# Patient Record
Sex: Female | Born: 1945
Health system: Southern US, Community
[De-identification: ages and names within clinical notes are randomized; demographics above are authoritative.]

## PROBLEM LIST (undated history)

## (undated) DIAGNOSIS — M792 Neuralgia and neuritis, unspecified: Secondary | ICD-10-CM

## (undated) DIAGNOSIS — M4150 Other secondary scoliosis, site unspecified: Secondary | ICD-10-CM

## (undated) DIAGNOSIS — J189 Pneumonia, unspecified organism: Secondary | ICD-10-CM

## (undated) DIAGNOSIS — M5030 Other cervical disc degeneration, unspecified cervical region: Secondary | ICD-10-CM

## (undated) DIAGNOSIS — Z9889 Other specified postprocedural states: Secondary | ICD-10-CM

## (undated) DIAGNOSIS — K219 Gastro-esophageal reflux disease without esophagitis: Secondary | ICD-10-CM

## (undated) DIAGNOSIS — I4510 Unspecified right bundle-branch block: Secondary | ICD-10-CM

## (undated) DIAGNOSIS — M5136 Other intervertebral disc degeneration, lumbar region: Secondary | ICD-10-CM

## (undated) DIAGNOSIS — M4180 Other forms of scoliosis, site unspecified: Secondary | ICD-10-CM

## (undated) DIAGNOSIS — M51369 Other intervertebral disc degeneration, lumbar region without mention of lumbar back pain or lower extremity pain: Secondary | ICD-10-CM

## (undated) DIAGNOSIS — Z950 Presence of cardiac pacemaker: Secondary | ICD-10-CM

## (undated) DIAGNOSIS — F32A Depression, unspecified: Secondary | ICD-10-CM

## (undated) DIAGNOSIS — F419 Anxiety disorder, unspecified: Secondary | ICD-10-CM

## (undated) DIAGNOSIS — R112 Nausea with vomiting, unspecified: Secondary | ICD-10-CM

## (undated) DIAGNOSIS — M1990 Unspecified osteoarthritis, unspecified site: Secondary | ICD-10-CM

## (undated) DIAGNOSIS — I10 Essential (primary) hypertension: Secondary | ICD-10-CM

## (undated) HISTORY — PX: DILATION AND CURETTAGE OF UTERUS: SHX78

## (undated) HISTORY — PX: CATARACT EXTRACTION W/ INTRAOCULAR LENS  IMPLANT, BILATERAL: SHX1307

## (undated) HISTORY — PX: BACK SURGERY: SHX140

## (undated) HISTORY — PX: OTHER SURGICAL HISTORY: SHX169

## (undated) HISTORY — PX: ANTERIOR CERVICAL DECOMP/DISCECTOMY FUSION: SHX1161

## (undated) HISTORY — PX: TONSILLECTOMY: SUR1361

---

## 1997-11-12 ENCOUNTER — Ambulatory Visit (HOSPITAL_COMMUNITY): Admission: RE | Admit: 1997-11-12 | Discharge: 1997-11-12 | Payer: Self-pay | Admitting: Obstetrics and Gynecology

## 1998-12-02 ENCOUNTER — Ambulatory Visit (HOSPITAL_COMMUNITY): Admission: RE | Admit: 1998-12-02 | Discharge: 1998-12-02 | Payer: Self-pay | Admitting: Obstetrics and Gynecology

## 1998-12-02 ENCOUNTER — Encounter (INDEPENDENT_AMBULATORY_CARE_PROVIDER_SITE_OTHER): Payer: Self-pay | Admitting: Specialist

## 1998-12-21 ENCOUNTER — Other Ambulatory Visit: Admission: RE | Admit: 1998-12-21 | Discharge: 1998-12-21 | Payer: Self-pay | Admitting: Obstetrics and Gynecology

## 1999-07-20 ENCOUNTER — Encounter: Admission: RE | Admit: 1999-07-20 | Discharge: 1999-07-20 | Payer: Self-pay | Admitting: Obstetrics and Gynecology

## 1999-07-20 ENCOUNTER — Encounter: Payer: Self-pay | Admitting: Obstetrics and Gynecology

## 2000-01-03 ENCOUNTER — Other Ambulatory Visit: Admission: RE | Admit: 2000-01-03 | Discharge: 2000-01-03 | Payer: Self-pay | Admitting: Obstetrics and Gynecology

## 2000-11-25 ENCOUNTER — Encounter: Payer: Self-pay | Admitting: Obstetrics and Gynecology

## 2000-11-25 ENCOUNTER — Encounter: Admission: RE | Admit: 2000-11-25 | Discharge: 2000-11-25 | Payer: Self-pay | Admitting: Physician Assistant

## 2001-01-03 ENCOUNTER — Other Ambulatory Visit: Admission: RE | Admit: 2001-01-03 | Discharge: 2001-01-03 | Payer: Self-pay | Admitting: Obstetrics and Gynecology

## 2001-03-25 ENCOUNTER — Encounter: Payer: Self-pay | Admitting: Sports Medicine

## 2001-03-25 ENCOUNTER — Encounter: Admission: RE | Admit: 2001-03-25 | Discharge: 2001-03-25 | Payer: Self-pay | Admitting: Family Medicine

## 2001-04-15 ENCOUNTER — Encounter: Payer: Self-pay | Admitting: Family Medicine

## 2001-04-15 ENCOUNTER — Encounter: Admission: RE | Admit: 2001-04-15 | Discharge: 2001-04-15 | Payer: Self-pay | Admitting: Family Medicine

## 2001-05-08 ENCOUNTER — Encounter: Admission: RE | Admit: 2001-05-08 | Discharge: 2001-05-08 | Payer: Self-pay | Admitting: Sports Medicine

## 2001-05-08 ENCOUNTER — Encounter: Payer: Self-pay | Admitting: Sports Medicine

## 2001-08-15 ENCOUNTER — Encounter: Admission: RE | Admit: 2001-08-15 | Discharge: 2001-09-17 | Payer: Self-pay | Admitting: Neurological Surgery

## 2001-10-07 ENCOUNTER — Encounter: Payer: Self-pay | Admitting: Neurological Surgery

## 2001-10-07 ENCOUNTER — Ambulatory Visit (HOSPITAL_COMMUNITY): Admission: RE | Admit: 2001-10-07 | Discharge: 2001-10-08 | Payer: Self-pay | Admitting: Neurological Surgery

## 2002-01-09 ENCOUNTER — Other Ambulatory Visit: Admission: RE | Admit: 2002-01-09 | Discharge: 2002-01-09 | Payer: Self-pay | Admitting: Obstetrics and Gynecology

## 2003-01-26 ENCOUNTER — Other Ambulatory Visit: Admission: RE | Admit: 2003-01-26 | Discharge: 2003-01-26 | Payer: Self-pay | Admitting: Obstetrics and Gynecology

## 2004-03-02 ENCOUNTER — Other Ambulatory Visit: Admission: RE | Admit: 2004-03-02 | Discharge: 2004-03-02 | Payer: Self-pay | Admitting: Obstetrics and Gynecology

## 2005-03-28 ENCOUNTER — Other Ambulatory Visit: Admission: RE | Admit: 2005-03-28 | Discharge: 2005-03-28 | Payer: Self-pay | Admitting: Obstetrics and Gynecology

## 2007-04-11 ENCOUNTER — Encounter: Admission: RE | Admit: 2007-04-11 | Discharge: 2007-04-11 | Payer: Self-pay | Admitting: Obstetrics and Gynecology

## 2008-06-01 ENCOUNTER — Encounter: Admission: RE | Admit: 2008-06-01 | Discharge: 2008-06-01 | Payer: Self-pay | Admitting: Obstetrics and Gynecology

## 2010-04-03 ENCOUNTER — Encounter
Admission: RE | Admit: 2010-04-03 | Discharge: 2010-05-19 | Payer: Self-pay | Source: Home / Self Care | Admitting: Physical Medicine and Rehabilitation

## 2010-05-26 ENCOUNTER — Ambulatory Visit (HOSPITAL_COMMUNITY)
Admission: RE | Admit: 2010-05-26 | Discharge: 2010-05-26 | Payer: Self-pay | Source: Home / Self Care | Attending: Obstetrics and Gynecology | Admitting: Obstetrics and Gynecology

## 2010-08-29 LAB — CBC
HCT: 41.2 % (ref 36.0–46.0)
Hemoglobin: 14 g/dL (ref 12.0–15.0)
MCHC: 34 g/dL (ref 30.0–36.0)
MCV: 98.9 fL (ref 78.0–100.0)
RDW: 12.3 % (ref 11.5–15.5)

## 2010-08-29 LAB — COMPREHENSIVE METABOLIC PANEL
Albumin: 3.9 g/dL (ref 3.5–5.2)
Alkaline Phosphatase: 60 U/L (ref 39–117)
BUN: 12 mg/dL (ref 6–23)
Chloride: 102 mEq/L (ref 96–112)
Creatinine, Ser: 0.65 mg/dL (ref 0.4–1.2)
Glucose, Bld: 91 mg/dL (ref 70–99)
Potassium: 4.1 mEq/L (ref 3.5–5.1)
Total Bilirubin: 0.5 mg/dL (ref 0.3–1.2)
Total Protein: 6.3 g/dL (ref 6.0–8.3)

## 2010-11-03 NOTE — Op Note (Signed)
[name]  Patient:    Karen Gomez, Karen Gomez Visit Number: [id number] MRN: [id number]          Service Type: DSU Location: 3000 [id number] Attending Physician:  Jonne Ply Dictated by:   Stefani Dama, M.D. Proc. Date: 10/07/01 Admit Date:  10/07/2001                             Operative Report  PREOPERATIVE DIAGNOSIS:  Cervical spondylosis with left cervical radiculopathy C7.  POSTOPERATIVE DIAGNOSIS:  Cervical spondylosis with left cervical radiculopathy C7.  OPERATION PERFORMED:  Anterior cervical diskectomy and arthrodesis C6-C7, structural allograft, Synthes plate fixation.  SURGEON:  Stefani Dama, M.D.  FIRST ASSISTANT:  Hewitt Shorts, M.D.  ANESTHESIA:  General endotracheal.  INDICATIONS FOR PROCEDURE:  The patient is a 65 year old individual who was having problems with neck and shoulder and left arm pain.  She has spondylitic disease with a narrow foramen at the C6-C7 level.  Having failed extensive efforts at conservative management, she is taken to the operating room.  DESCRIPTION OF PROCEDURE:  The patient was brought to the operating room supine on the stretcher.  After smooth induction of general endotracheal anesthesia, she was placed in five pounds of halter traction.  The neck was prepped with DuraPrep and draped in sterile fashion.  A transverse incision was made on the left side of the neck and carried down through the platysma. The plane between the sternocleidomastoid and the strap muscles was dissected bluntly until the prevertebral space was reached.  The first identifiable disk space was noted to be C6-7 on a radiograph.  Then, using a 15 blade, the anterior longitudinal ligament was opened and disk from the disk space of C6-7 was removed.  This was noted to be severely degenerated and desiccated.  The end plates were scraped free of disk material.  As the posterior longitudinal ligament was reached  with the assistance of Dr. Newell Coral, we then performed the final decompression opening the posterior longitudinal ligament and also decompressing the osteophytes on the inferior margin of the body of C6 and superior margin of the body of C7.  Both sides were decompressed in this fashion using a high speed air drill and 2.3 mm dissecting tool to take down severe hypertrophy of the uncinate process particularly on that left side.  In the end, both lateral recesses were decompressed.  Hemostasis was achieved. After smoothing and truing the edges of the vertebral end plates, a 7 mm tricortical graft was placed with a cortical surface facing dorsally.  Then with the assistance of Dr. Newell Coral we placed a 16 mm small stature Synthes plate with four locking 4 x 14 mm screws.  The position of the plate was checked radiographically.  When this was secure and hemostasis was doubly checked, the prevertebral tissues were irrigated copiously with antibiotic irrigating solution.  The platysma was then closed with 3-0 Vicryl in interrupted fashion.  3-0 Vicryl was used to close the subcuticular tissues. The patient tolerated the procedure well and was returned to recovery room in stable condition. Dictated by:   Stefani Dama, M.D. Attending Physician:  Jonne Ply DD:  10/07/01 TD:  10/07/01 Job: [id number]

## 2012-07-02 ENCOUNTER — Other Ambulatory Visit: Payer: Self-pay | Admitting: Dermatology

## 2012-10-31 ENCOUNTER — Other Ambulatory Visit (HOSPITAL_COMMUNITY): Payer: Self-pay | Admitting: Neurological Surgery

## 2012-10-31 ENCOUNTER — Other Ambulatory Visit: Payer: Self-pay | Admitting: Neurological Surgery

## 2012-10-31 DIAGNOSIS — M545 Low back pain: Secondary | ICD-10-CM

## 2012-11-11 ENCOUNTER — Encounter (HOSPITAL_COMMUNITY): Payer: Self-pay | Admitting: Pharmacy Technician

## 2012-11-12 ENCOUNTER — Ambulatory Visit (HOSPITAL_COMMUNITY)
Admission: RE | Admit: 2012-11-12 | Discharge: 2012-11-12 | Disposition: A | Discharge status: Home / Self Care | Payer: Medicare Other | Source: Ambulatory Visit | Attending: Neurological Surgery | Admitting: Neurological Surgery

## 2012-11-12 ENCOUNTER — Encounter (HOSPITAL_COMMUNITY): Payer: Self-pay

## 2012-11-12 DIAGNOSIS — M545 Low back pain: Secondary | ICD-10-CM

## 2012-11-12 DIAGNOSIS — M4120 Other idiopathic scoliosis, site unspecified: Secondary | ICD-10-CM | POA: Insufficient documentation

## 2012-11-12 DIAGNOSIS — M5137 Other intervertebral disc degeneration, lumbosacral region: Secondary | ICD-10-CM | POA: Insufficient documentation

## 2012-11-12 DIAGNOSIS — M48061 Spinal stenosis, lumbar region without neurogenic claudication: Secondary | ICD-10-CM | POA: Insufficient documentation

## 2012-11-12 DIAGNOSIS — M51379 Other intervertebral disc degeneration, lumbosacral region without mention of lumbar back pain or lower extremity pain: Secondary | ICD-10-CM | POA: Insufficient documentation

## 2012-11-12 MED ORDER — HYDROCODONE-ACETAMINOPHEN 5-325 MG PO TABS: 1.0000 | ORAL_TABLET | ORAL | Status: DC | PRN

## 2012-11-12 MED ORDER — ONDANSETRON HCL 4 MG/2ML IJ SOLN: 4.0000 mg | Freq: Four times a day (QID) | INTRAMUSCULAR | Status: DC | PRN

## 2012-11-12 MED ORDER — DIAZEPAM 5 MG PO TABS
ORAL_TABLET | ORAL | Status: AC
  Filled 2012-11-12: qty 2

## 2012-11-12 MED ORDER — DIAZEPAM 5 MG PO TABS: 10.0000 mg | ORAL_TABLET | Freq: Once | ORAL | Status: DC

## 2012-11-12 MED ORDER — IOHEXOL 180 MG/ML  SOLN
20.0000 mL | Freq: Once | INTRAMUSCULAR | Status: AC | PRN
  Administered 2012-11-12: 14 mL via INTRATHECAL

## 2012-11-12 MED ORDER — DIAZEPAM 5 MG PO TABS
10.0000 mg | ORAL_TABLET | Freq: Once | ORAL | Status: AC
  Administered 2012-11-12: 10 mg via ORAL

## 2012-11-12 NOTE — Procedures (Signed)
CHIEF COMPLAINT: Chronic back pain and neck pain with pain extending into the lower extremities, more on the left than on the right. HISTORY OF PRESENT ILLNESS: Karen Gomez is a 67 year old right-handed individual who notes that she has had back pain that has been getting progressively worse over about 12-year period of time. It has more recently gone to the point where she finds it difficult to deal with for activities of daily living. She notes that in March while trying to shovel her drive after one of the snowstorms, she started to experience worsening pain into the leg. She notes now that she has substantial left lumbar radicular pain down the anterolateral aspect of the thigh and leg and she also gets some pain on to the right side. She notes that she has difficulty with neck pain and she previously had an anterior decompression in her cervical spine done by me a number of years ago. This had recovered well, but now she is having some increasing neck symptoms. A few years back when she started having back pain, she was seen by Dr. Ethelene Hal. She had an MRI of her lumbar spine, which demonstrated that she had some scoliotic changes in the lower lumbar spine and Dr. Ethelene Hal treated her with some epidural steroid injections. She notes that the relief from the injections was not long lived. She had made this appointment several months ago to further explore the possibility of any surgery or any specific treatment that could give her a good relief. She had been doing a number of exercises that had been prescribed by physical therapy and these most likely flexion type exercises. She notes that she cannot lie on her tummy very comfortably at all and she used to be a person who slept on her stomach, but cannot lie on the tummy or on her right side. I reviewed the MRI of her lumbar spine, which demonstrates that the patient had some moderate spondylitic changes with stenosis and indeed the MRI is complicated by the presence of  scoliosis. I note that the stenosis is moderate in nature at levels of L3-L4, L4-L5 and L5-S1. PAST MEDICAL HISTORY: Reveals that her general health has been excellent. She does have some hypertension.   Medications and Allergies: List includes lisinopril, Prometrium, Premarin, and a generic form of Vicodin, which she is using sparingly half tablet per day, these have been prescribed by Dr. Ethelene Hal. She notes an allergy to PENICILLIN.   Prior Operations: Only surgery has been a neck operation back in 09/2001.  FAMILY HISTORY: Reveals that her mother is deceased, father is deceased. There is some heart disease and her mother had some osteoporosis. PERSONAL HISTORY: Includes that she is nonsmoker, she drinks alcohol socially.  REVIEW OF SYSTEMS: Notable for back pain, arm pain, leg pain, joint pain with swelling, arthritis, neck pain, high blood pressures, some leg pain when walking, and wearing of glasses, all noted on a 14-point review sheet. PHYSICAL EXAMINATION: Height and weight have been stable at 5\' 3"  and 118 pounds. She stands straight and erect. There is a palpable scoliosis in the lumbar spine with a small gibbus formation off to the right side. Her motor function in the major groups including iliopsoas, quadriceps, tibialis anterior and gastrocs are all intact. Reflexes are 2+ in the patellae and trace in both Achilles. Vibratory sensation is intact in the distal lower extremities. Today in the office to obtain more information regarding her spine. We did an AP and lateral radiographs of the lumbar spine.  This demonstrates on the AP view that she has a lumbar scoliosis apex to the right at L2 with the curve measuring 24 degrees. This is on an x-ray with her in the standing position. Lateral view shows that she has some flattening of the normal lumbar lordosis. Marked lumbar spondylitic changes notably at L4-L5 and L3-L4 and to a lesser extent at L2-L3 and L1-L2. Impression: On the basis of the  x-ray, it is that there is a degenerative lumbar scoliosis apex to the right measuring 24 degrees.  IMPRESSION: The patient has a combination of spondylitic stenosis and scoliosis. Her symptoms seem worse than would be suspected on the basis of the MRI from 2011. I would suggest at this point that we do a myelogram, CAT scan to better image and nature of her scoliosis in particularly the degree of stenosis that she is experiencing. This would give a better idea of the nature of any surgical intervention, how much and what level should be decompressed and stabilized. I noted that with a 24 degree curve at this age is likely to progress and given the fact that she is already symptomatic, ultimately stabilization due of the scoliosis now may be in her best interest for good long-term result. I also discussed with Ms. Furnari the fact that with scoliosis one will need to fix the entire curve as focal fixes tend to leave the further degeneration and further curvature formation. We will discuss the nature of surgery once we have the chance to complete the myelogram.  I discussed with her the myelogram itself which is done as an outpatient at [facility] usually requiring about a half days stay. We will try to schedule this at the earliest convenience.   Pre op Dx: Degenerative scoliosis Post op Dx: Degenerative scoliosis, lumbar radiculopathy Procedure: Lumbar myelogram Surgeon: Divante Kotch Puncture level: L3-4 Fluid color: Clear colorless Injection: Iohexol 180 12 cc Findings: Spondylosis on right side L4-5 most severe, for CT scan

## 2014-09-20 ENCOUNTER — Other Ambulatory Visit: Payer: Self-pay | Admitting: Neurological Surgery

## 2014-09-20 DIAGNOSIS — M4722 Other spondylosis with radiculopathy, cervical region: Secondary | ICD-10-CM

## 2014-10-07 ENCOUNTER — Ambulatory Visit
Admission: RE | Admit: 2014-10-07 | Discharge: 2014-10-07 | Disposition: A | Discharge status: Home / Self Care | Payer: Medicare PPO | Source: Ambulatory Visit | Attending: Neurological Surgery | Admitting: Neurological Surgery

## 2014-10-07 DIAGNOSIS — M4722 Other spondylosis with radiculopathy, cervical region: Secondary | ICD-10-CM

## 2015-01-20 ENCOUNTER — Other Ambulatory Visit: Payer: Self-pay | Admitting: Obstetrics and Gynecology

## 2015-01-21 LAB — CYTOLOGY - PAP

## 2016-01-09 ENCOUNTER — Other Ambulatory Visit: Payer: Self-pay | Admitting: Sports Medicine

## 2016-01-09 DIAGNOSIS — M25551 Pain in right hip: Secondary | ICD-10-CM

## 2016-01-09 DIAGNOSIS — M545 Low back pain: Secondary | ICD-10-CM

## 2016-01-23 ENCOUNTER — Ambulatory Visit
Admission: RE | Admit: 2016-01-23 | Discharge: 2016-01-23 | Disposition: A | Discharge status: Home / Self Care | Payer: Medicare PPO | Source: Ambulatory Visit | Attending: Sports Medicine | Admitting: Sports Medicine

## 2016-01-23 DIAGNOSIS — M25551 Pain in right hip: Secondary | ICD-10-CM

## 2016-01-23 DIAGNOSIS — M545 Low back pain: Secondary | ICD-10-CM

## 2016-05-13 ENCOUNTER — Encounter (HOSPITAL_COMMUNITY): Payer: Self-pay

## 2016-05-13 ENCOUNTER — Emergency Department (HOSPITAL_COMMUNITY)
Admission: EM | Admit: 2016-05-13 | Discharge: 2016-05-13 | Disposition: A | Discharge status: Home / Self Care | Payer: Medicare PPO | Attending: Emergency Medicine | Admitting: Emergency Medicine

## 2016-05-13 DIAGNOSIS — I10 Essential (primary) hypertension: Secondary | ICD-10-CM | POA: Insufficient documentation

## 2016-05-13 DIAGNOSIS — Z79899 Other long term (current) drug therapy: Secondary | ICD-10-CM | POA: Insufficient documentation

## 2016-05-13 DIAGNOSIS — R55 Syncope and collapse: Secondary | ICD-10-CM | POA: Insufficient documentation

## 2016-05-13 HISTORY — DX: Essential (primary) hypertension: I10

## 2016-05-13 HISTORY — DX: Neuralgia and neuritis, unspecified: M79.2

## 2016-05-13 HISTORY — DX: Gastro-esophageal reflux disease without esophagitis: K21.9

## 2016-05-13 LAB — CBC
HCT: 42.1 % (ref 36.0–46.0)
Hemoglobin: 13.9 g/dL (ref 12.0–15.0)
MCH: 33.6 pg (ref 26.0–34.0)
MCHC: 33 g/dL (ref 30.0–36.0)
MCV: 101.7 fL — AB (ref 78.0–100.0)
PLATELETS: 287 10*3/uL (ref 150–400)
RBC: 4.14 MIL/uL (ref 3.87–5.11)
RDW: 12.6 % (ref 11.5–15.5)
WBC: 6.1 10*3/uL (ref 4.0–10.5)

## 2016-05-13 LAB — BASIC METABOLIC PANEL
Anion gap: 8 (ref 5–15)
BUN: 12 mg/dL (ref 6–20)
CHLORIDE: 104 mmol/L (ref 101–111)
CO2: 26 mmol/L (ref 22–32)
CREATININE: 0.75 mg/dL (ref 0.44–1.00)
Calcium: 9.6 mg/dL (ref 8.9–10.3)
GFR calc Af Amer: >60 mL/min (ref >60)
GFR calc non Af Amer: >60 mL/min (ref >60)
Glucose, Bld: 96 mg/dL (ref 65–99)
Potassium: 4.1 mmol/L (ref 3.5–5.1)
Sodium: 138 mmol/L (ref 135–145)

## 2016-05-13 LAB — URINALYSIS, ROUTINE W REFLEX MICROSCOPIC
Bilirubin Urine: NEGATIVE
GLUCOSE, UA: NEGATIVE mg/dL
Ketones, ur: 15 mg/dL — AB
Nitrite: NEGATIVE
PH: 6 (ref 5.0–8.0)
PROTEIN: NEGATIVE mg/dL
Specific Gravity, Urine: 1.009 (ref 1.005–1.030)

## 2016-05-13 LAB — URINE MICROSCOPIC-ADD ON

## 2016-05-13 NOTE — ED Triage Notes (Signed)
Patient reports that she was sitting and reading the paper this am after breakfast and felt her heart pounding then had syncopal event. Reports that she awoke quickly and felt weak following the event but none now. Had similar event on recent trip to [place] where she had same event in the bath tub. No CP, no neuro deficits, alert and oriented

## 2016-05-13 NOTE — ED Provider Notes (Signed)
[facility] DEPT Provider Note   CSN: [id number] Arrival date & time: 05/13/16  1315     History   Chief Complaint Chief Complaint  Patient presents with  . Loss of Consciousness    HPI Karen Gomez is a 70 y.o. female.  She presents for evaluation of a syncopal episode which occurred today while sitting on chair, reading the paper. She had a short prodrome, about 30 seconds of feeling faint. She believes that she had a very short period of loss of consciousness. When she awoke, she sat up, then put her head between her legs, for a few seconds, then walked to a couch and lay down for a few minutes. After that, she decided to walk up stairs in her home to her blood pressure. He felt dizzy while walking to her bedroom. Her initial blood pressure, at 8:06 AM hours this morning was 173/83. Blood pressure increased gradually over the next 5 hours, to about 135/72. After that she decided to go to an urgent care for evaluation. She was screened  there and told to come to the ED for comprehensive evaluation. She has similar episode about a month ago, when she was sitting in a bathtub. She has not had chest pain with either episode. She does not have a documented low blood pressure with either episode. She did eat prior to the episode today, and took her usual morning medications. She denies other illnesses recently. She has not had any fever, chills, cough, shortness of breath, nausea, vomiting, weakness or dizziness. There are no other no modifying factors.  HPI  Past Medical History:  Diagnosis Date  . GERD (gastroesophageal reflux disease)   . Hypertension   . Nerve pain     There are no active problems to display for this patient.   History reviewed. No pertinent surgical history.  OB History    No data available       Home Medications    Prior to Admission medications   Medication Sig Start Date End Date Taking? Authorizing Provider  CALCIUM PO Take 1 tablet by mouth  daily.   Yes Historical Provider, MD  cholecalciferol (VITAMIN D) 1000 units tablet Take 1,000 Units by mouth daily.   Yes Historical Provider, MD  estradiol (ESTRACE) 0.5 MG tablet Take 0.5 mg by mouth daily.   Yes Historical Provider, MD  gabapentin (NEURONTIN) 300 MG capsule Take 300 mg by mouth every evening. 03/10/16  Yes Historical Provider, MD  HYDROcodone-acetaminophen (NORCO/VICODIN) 5-325 MG per tablet Take 0.5 tablets by mouth daily.   Yes Historical Provider, MD  lisinopril (PRINIVIL,ZESTRIL) 10 MG tablet Take 10 mg by mouth daily.   Yes Historical Provider, MD  Multiple Vitamin (MULTIVITAMIN) tablet Take 1 tablet by mouth daily.   Yes Historical Provider, MD  progesterone (PROMETRIUM) 100 MG capsule Take 100 mg by mouth daily.   Yes Historical Provider, MD  ranitidine (ZANTAC) 300 MG capsule Take 300 mg by mouth daily. 05/03/16  Yes Historical Provider, MD    Family History No family history on file.  Social History Social History  Substance Use Topics  . Smoking status: Not on file  . Smokeless tobacco: Not on file  . Alcohol use Not on file     Allergies   Chocolate and Penicillins   Review of Systems Review of Systems  All other systems reviewed and are negative.    Physical Exam Updated Vital Signs BP 135/69   Pulse 70   Temp 98.4 F (36.9  C) (Oral)   Resp 16   SpO2 99%   Physical Exam  Constitutional: She is oriented to person, place, and time. She appears well-developed and well-nourished. No distress.  Elderly, well-nourished, vigorous  HENT:  Head: Normocephalic and atraumatic.  Eyes: Conjunctivae and EOM are normal. Pupils are equal, round, and reactive to light.  Neck: Normal range of motion and phonation normal. Neck supple.  Cardiovascular: Normal rate and regular rhythm.   Pulmonary/Chest: Effort normal and breath sounds normal. She exhibits no tenderness.  Abdominal: Soft. She exhibits no distension. There is no tenderness. There is no  guarding.  Musculoskeletal: Normal range of motion.  Neurological: She is alert and oriented to person, place, and time. She exhibits normal muscle tone.  No dysarthria and aphasia or nystagmus.  Skin: Skin is warm and dry.  Psychiatric: She has a normal mood and affect. Her behavior is normal. Judgment and thought content normal.  Nursing note and vitals reviewed.    ED Treatments / Results  Labs (all labs ordered are listed, but only abnormal results are displayed) Labs Reviewed  CBC - Abnormal; Notable for the following:       Result Value   MCV 101.7 (*)    All other components within normal limits  BASIC METABOLIC PANEL  URINALYSIS, ROUTINE W REFLEX MICROSCOPIC (NOT AT [facility])  CBG MONITORING, ED    EKG  EKG Interpretation  Date/Time:  Sunday May 13 2016 13:33:17 EST Ventricular Rate:  91 PR Interval:  146 QRS Duration: 130 QT Interval:  388 QTC Calculation: 477 R Axis:   -79 Text Interpretation:  Sinus rhythm with Fusion complexes Left axis deviation Right bundle branch block Abnormal ECG Since last tracing Right bundle branch block is new Confirmed by Eulis Foster  MD, Alexandru Moorer [id number]) on 05/13/2016 2:21:55 PM       Radiology No results found.  Procedures Procedures (including critical care time)  Medications Ordered in ED Medications - No data to display   Initial Impression / Assessment and Plan / ED Course  I have reviewed the triage vital signs and the nursing notes.  Pertinent labs & imaging results that were available during my care of the patient were reviewed by me and considered in my medical decision making (see chart for details).  Clinical Course     Medications - No data to display  Patient Vitals for the past 24 hrs:  BP Temp Temp src Pulse Resp SpO2  05/13/16 1615 (!) 118/54 - - 63 15 99 %  05/13/16 1600 128/56 - - 70 17 98 %  05/13/16 1530 135/69 - - 70 16 99 %  05/13/16 1500 130/72 - - 66 18 99 %  05/13/16 1415 133/57 - - 61 14 100 %    05/13/16 1328 - 98.4 F (36.9 C) Oral - - -  05/13/16 1323 143/67 - Oral 84 18 100 %    4:24 PM Reevaluation with update and discussion. After initial assessment and treatment, an updated evaluation reveals she has been able to ambulate and eat, in emergency department and has no further complaints. She is comfortable. Findings discussed with patient, all questions answered. Chaunte Hornbeck L    Final Clinical Impressions(s) / ED Diagnoses   Final diagnoses:  None    Syncope, recurrent, short duration, spontaneously improved without residual symptoms. Cardiac monitor, normal in the emergency department. Etiology not clear, and includes cardiac arrhythmia in the differential. Doubt ACS, PE, serious bacterial infection or metabolic instability. She is stable for discharge  with outpatient follow-up, with her primary care provider. She will likely require prolonged cardiac monitoring.   Nursing Notes Reviewed/ Care Coordinated Applicable Imaging Reviewed Interpretation of Laboratory Data incorporated into ED treatment  The patient appears reasonably screened and/or stabilized for discharge and I doubt any other medical condition or other [facility] requiring further screening, evaluation, or treatment in the ED at this time prior to discharge.  Plan: Home Medications- continue; Home Treatments- rest; return here if the recommended treatment, does not improve the symptoms; Recommended follow up- PCP for f/u and further eval., likely Holter monitor   New Prescriptions New Prescriptions   No medications on file     Daleen Bo, MD 05/18/16 1106

## 2016-05-13 NOTE — Discharge Instructions (Signed)
Get plenty of rest and drink a lot of fluids.  You feel dizzy, lie down, to help prevent passing out.  Return here, if needed, for problems.

## 2016-05-16 ENCOUNTER — Telehealth: Payer: Self-pay

## 2016-05-16 NOTE — Telephone Encounter (Signed)
SENT NOTES TO SCHEDULING 

## 2016-05-16 NOTE — Progress Notes (Signed)
Cardiology Office Note Date:  05/17/2016  Patient ID:  Karen Gomez, Karen Gomez 02/04/1946, MRN [id number] PCP:  Karen Schwalbe, MD  Cardiologist:  New to [facility], Dr. Curt Gomez   Chief Complaint: new patient, needs pre-op clearance, syncope  History of Present Illness: Karen Gomez is a 70 y.o. 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  She comes to the office today tas a new patient for a syncopal event and near syncopal event.  She reports that lat Oct was in [place]. Touring with her sister-in-law, had been out and about all day and that evening, while in the bathtub (she points out that she was actively washing up as usual prefers baths rather then showers, not soaking/relaxing) when she felt lightheaded, weak and next thing she knew she heard/felt her head hit that back of the tub having fallen back.  Her sister -in-law heard her and called in if she was OK, she responded not really not certain what happened, was helped to rest in bed. Tosugh did not feel poorly afterwards at all.   She wrote it off as being over tired from the long day of touring.  Sunday she was seated doing something with her chromebook and felt again lightheaded dizzy, bent forward to put her head down, not feeling better got herself to the sofa and laid down until it passed.  She has not had any kind of CP, palpitations or SOB with pur without the events.  She denies any hx of syncope or near syncope.  She has an unrelated R rotator cuff tear that she is pneding a surgical eval for, she is hoping to get repaired in January after the holidays.  ER visit 05/13/16 2/2 syncope, labs OK, EKG was SR, LAD, RBBB   Past Medical History:  Diagnosis Date  . GERD (gastroesophageal reflux disease)   . Hypertension   . Nerve pain     No past surgical history on file.  Current Outpatient Prescriptions  Medication Sig Dispense  Refill  . CALCIUM PO Take 1 tablet by mouth daily.    . cholecalciferol (VITAMIN D) 1000 units tablet Take 1,000 Units by mouth daily.    Marland Kitchen estradiol (ESTRACE) 0.5 MG tablet Take 0.5 mg by mouth daily.    Marland Kitchen gabapentin (NEURONTIN) 300 MG capsule Take 300 mg by mouth every evening.    Marland Kitchen HYDROcodone-acetaminophen (NORCO/VICODIN) 5-325 MG per tablet Take 0.5 tablets by mouth daily.    Marland Kitchen lisinopril (PRINIVIL,ZESTRIL) 10 MG tablet Take 10 mg by mouth daily.    . Multiple Vitamin (MULTIVITAMIN) tablet Take 1 tablet by mouth daily.    . progesterone (PROMETRIUM) 100 MG capsule Take 100 mg by mouth daily.    . ranitidine (ZANTAC) 300 MG capsule Take 300 mg by mouth daily.     No current facility-administered medications for this visit.     Allergies:   Chocolate and Penicillins   Social History:  Family History:  Father, brother both died of heart disease  ROS:  Please see the history of present illness.  All other systems are reviewed and otherwise negative.   PHYSICAL EXAM:  VS:  BP (!) 148/68   Pulse 78   Ht 5\' 3"  (1.6 m)   Wt 116 lb (52.6 kg)   BMI 20.55 kg/m  BMI: Body mass index is 20.55 kg/m. Well nourished, well developed, in no acute distress  HEENT: normocephalic, atraumatic  Neck: no JVD, carotid bruits or masses Cardiac: RRR; no significant murmurs, no rubs, or gallops Lungs:  clear to auscultation bilaterally, no wheezing, rhonchi or rales  Abd: soft, nontender MS: no deformity or atrophy Ext:  No edema  Skin: warm and dry, no rash Neuro:  No gross deficits appreciated Psych: euthymic mood, full affect   EKG:  Done today shows SR, RBBB, LAD  Recent Labs: 05/13/2016: BUN 12; Creatinine, Ser 0.75; Hemoglobin 13.9; Platelets 287; Potassium 4.1; Sodium 138  No results found for requested labs within last 8760 hours.   Estimated Creatinine Clearance: 53.4 mL/min (by C-G formula based on SCr of 0.75 mg/dL).   Wt Readings from Last 3 Encounters:  05/17/16 116 lb  (52.6 kg)  11/12/12 115 lb (52.2 kg)     Other studies reviewed: Additional studies/records reviewed today include: summarized above  ASSESSMENT AND PLAN:  1. Syncope, near syncope     Unknown etiology     Abnormal EKG  The patient is made aware of [place] law, no driving for 6 months post syncope  The patient is seen in conjunction with Dr. Curt Gomez We will have her wear a 2 week event monitor, get an echo doppler   Disposition: F/u clinic in 6-8 weeks, sooner if needed.  Current medicines are reviewed at length with the patient today.  The patient did not have any concerns regarding medicines.  Karen Lasso, PA-C 05/17/2016 1:49 PM     Karen Gomez  [place] [id number] [phone] (office)  [phone] (fax)

## 2016-05-17 ENCOUNTER — Encounter: Payer: Self-pay | Admitting: Physician Assistant

## 2016-05-17 ENCOUNTER — Ambulatory Visit (INDEPENDENT_AMBULATORY_CARE_PROVIDER_SITE_OTHER): Payer: Medicare PPO | Admitting: Physician Assistant

## 2016-05-17 VITALS — BP 148/68 | HR 78 | Ht 63.0 in | Wt 116.0 lb

## 2016-05-17 DIAGNOSIS — R55 Syncope and collapse: Secondary | ICD-10-CM | POA: Diagnosis not present

## 2016-05-17 NOTE — Patient Instructions (Addendum)
Medication Instructions:  Your physician recommends that you continue on your current medications as directed. Please refer to the Current Medication list given to you today.    If you need a refill on your cardiac medications before your next appointment, please call your pharmacy.  Labwork: NONE ORDERED  TODAY    Testing/Procedures: Your physician has requested that you have an echocardiogram. Echocardiography is a painless test that uses sound waves to create images of your heart. It provides your doctor with information about the size and shape of your heart and how well your heart's chambers and valves are working. This procedure takes approximately one hour. There are no restrictions for this procedure.  Your physician has recommended that you wear an event monitor. Event monitors are medical devices that record the heart's electrical activity. Doctors most often [place] these monitors to diagnose arrhythmias. Arrhythmias are problems with the speed or rhythm of the heartbeat. The monitor is a small, portable device. You can wear one while you do your normal daily activities. This is usually used to diagnose what is causing palpitations/syncope (passing out).   Follow-Up:  6 to 8  WEEKS WITH URSUY  OR CAMNITZ     Any Other Special Instructions Will Be Listed Below (If Applicable).   YOU HAVE BEEN RECOMMENDED TO NOT DRIVE FOR 6 MONTHS

## 2016-05-24 ENCOUNTER — Encounter: Payer: Self-pay | Admitting: Cardiology

## 2016-05-24 ENCOUNTER — Ambulatory Visit (INDEPENDENT_AMBULATORY_CARE_PROVIDER_SITE_OTHER): Payer: Medicare PPO

## 2016-05-24 DIAGNOSIS — R55 Syncope and collapse: Secondary | ICD-10-CM

## 2016-05-31 ENCOUNTER — Encounter: Payer: Self-pay | Admitting: Cardiology

## 2016-06-07 ENCOUNTER — Other Ambulatory Visit: Payer: Self-pay

## 2016-06-07 ENCOUNTER — Ambulatory Visit (HOSPITAL_COMMUNITY): Payer: Medicare PPO | Attending: Internal Medicine

## 2016-06-07 DIAGNOSIS — R55 Syncope and collapse: Secondary | ICD-10-CM | POA: Diagnosis not present

## 2016-06-07 DIAGNOSIS — I10 Essential (primary) hypertension: Secondary | ICD-10-CM | POA: Diagnosis not present

## 2016-06-08 ENCOUNTER — Telehealth: Payer: Self-pay | Admitting: *Deleted

## 2016-06-08 NOTE — Telephone Encounter (Signed)
SPOKE TO PT ABOUT RESULTS..MONITOR  WAS PUT ON 05/24/16 MAILED BACK ON  06/06/16

## 2016-06-08 NOTE — Telephone Encounter (Signed)
-----   Message from [facility] [place] sent at 06/08/2016  1:30 PM EST ----- Please let her know her echo looks good, nothing of concern. Please make sure she has or is scheduled for the heart monitor.  Thanks [employer]

## 2016-06-15 ENCOUNTER — Ambulatory Visit (INDEPENDENT_AMBULATORY_CARE_PROVIDER_SITE_OTHER): Payer: Medicare PPO | Admitting: Cardiology

## 2016-06-15 ENCOUNTER — Encounter: Payer: Self-pay | Admitting: Cardiology

## 2016-06-15 VITALS — BP 122/70 | HR 62 | Ht 63.0 in | Wt 117.4 lb

## 2016-06-15 DIAGNOSIS — R55 Syncope and collapse: Secondary | ICD-10-CM | POA: Diagnosis not present

## 2016-06-15 NOTE — Patient Instructions (Signed)
Medication Instructions:  Your physician recommends that you continue on your current medications as directed. Please refer to the Current Medication list given to you today.  * If you need a refill on your cardiac medications before your next appointment, please call your pharmacy.   Labwork: None ordered  Testing/Procedures: None ordered  Follow-Up: No follow up is needed at this time with Dr. Camnitz.  He will see you on an as needed basis.   Thank you for choosing [facility]!!   Alexandra Lipps, RN [phone]     

## 2016-06-15 NOTE — Progress Notes (Signed)
Electrophysiology Office Note   Date:  06/15/2016   ID:  Maudine, Mcaskill 1945/10/07, MRN [id number]  PCP:  Vidal Schwalbe, MD Primary Electrophysiologist:  Kourtney Montesinos Meredith Leeds, MD    Chief Complaint  Patient presents with  . Advice Only    syncope     History of Present Illness: LEIRA CARLSTEDT is a 70 y.o. female who presents today for electrophysiology evaluation.   She has a history of hypertension. She had a syncopal episode on 11/26 on sitting in a chair reading the paper. She felt faint for about 30 seconds prior to the episode. When she awoke, she laid on the couch for a few minutes. She decided to go upstairs to check her blood pressure but felt dizzy when walking to her bedroom. Her blood pressure was 173/83. She had a similar episode while sitting in the bathtub in October.. She had no chest pain with either episode. She began to feel lightheaded and remembers hitting her head on the tub. She was uncertain as to what happened. She says that she does not feel poorly after the episode and rendered office being tired from a touring [place].   Today, she denies symptoms of palpitations, chest pain, shortness of breath, orthopnea, PND, lower extremity edema, claudication, dizziness, presyncope, syncope, bleeding, or neurologic sequela. The patient is tolerating medications without difficulties and is otherwise without complaint today. She is not had another syncopal episode.   Past Medical History:  Diagnosis Date  . GERD (gastroesophageal reflux disease)   . Hypertension   . Nerve pain    No past surgical history on file.   Current Outpatient Prescriptions  Medication Sig Dispense Refill  . CALCIUM PO Take 1 tablet by mouth daily.    . cholecalciferol (VITAMIN D) 1000 units tablet Take 1,000 Units by mouth daily.    Marland Kitchen estradiol (ESTRACE) 0.5 MG tablet Take 0.5 mg by mouth daily.    Marland Kitchen gabapentin (NEURONTIN) 300 MG capsule Take 300 mg by mouth every evening.    Marland Kitchen  HYDROcodone-acetaminophen (NORCO/VICODIN) 5-325 MG per tablet Take 0.5 tablets by mouth daily.    Marland Kitchen lisinopril (PRINIVIL,ZESTRIL) 10 MG tablet Take 10 mg by mouth daily.    . Multiple Vitamin (MULTIVITAMIN) tablet Take 1 tablet by mouth daily.    . progesterone (PROMETRIUM) 100 MG capsule Take 100 mg by mouth daily.    . ranitidine (ZANTAC) 300 MG capsule Take 300 mg by mouth daily.     No current facility-administered medications for this visit.     Allergies:   Chocolate and Penicillins   Social History:  The patient  reports that she has never smoked. She has never used smokeless tobacco. She reports that she does not drink alcohol or use drugs.   Family History:  The patient's family history includes Dementia in her mother; Heart disease in her brother and father.    ROS:  Please see the history of present illness.   Otherwise, review of systems is positive for none.   All other systems are reviewed and negative.    PHYSICAL EXAM: VS:  There were no vitals taken for this visit. , BMI There is no height or weight on file to calculate BMI. GEN: Well nourished, well developed, in no acute distress  HEENT: normal  Neck: no JVD, carotid bruits, or masses Cardiac: RRR; no murmurs, rubs, or gallops,no edema  Respiratory:  clear to auscultation bilaterally, normal work of breathing GI: soft, nontender, nondistended, + BS  MS: no deformity or atrophy  Skin: warm and dry Neuro:  Strength and sensation are intact Psych: euthymic mood, full affect  EKG:  EKG is not ordered today. Personal review of the ekg ordered 05/17/16 shows sinus rhythm, right bundle branch block, left anterior fascicular block  Recent Labs: 05/13/2016: BUN 12; Creatinine, Ser 0.75; Hemoglobin 13.9; Platelets 287; Potassium 4.1; Sodium 138    Lipid Panel  No results found for: CHOL, TRIG, HDL, CHOLHDL, VLDL, LDLCALC, LDLDIRECT   Wt Readings from Last 3 Encounters:  05/17/16 116 lb (52.6 kg)  11/12/12 115 lb  (52.2 kg)      Other studies Reviewed: Additional studies/ records that were reviewed today include: TTE 06/07/16, 30 day monitor 05/24/16  Review of the above records today demonstrates:  - LVEF 60-65%, normal wall thickness, normal wall motion, diastolic   dysfunction, normal LV filling pressure, normal LA size, normal   IVC.  Sinus rhythm with sinus tachycardia.   ASSESSMENT AND PLAN:  1.  Syncope: Normal echo and no evidence of arrhythmia seen on her 30 day monitor. Due to her normal echo and normal monitor, there are no further workup that needs to occur. I told her that if she has further issues to call the office back and we'll be happy to see her.  2. Preoperative evaluation: She has no active chest pain, shortness of breath. She has had an echo that shows no major abnormalities and a 30 day monitor without evidence of arrhythmia. Due to that, she would be at a low to intermediate risk for an intermediate risk procedure.    Current medicines are reviewed at length with the patient today.   The patient does not have concerns regarding her medicines.  The following changes were made today:  none  Labs/ tests ordered today include:  No orders of the defined types were placed in this encounter.    Disposition:   FU with Dajana Gehrig PRN  Signed, Ashyah Quizon Meredith Leeds, MD  06/15/2016 2:02 PM     [facility] [address] [place] [address] [phone] (office) [phone] (fa

## 2016-06-20 ENCOUNTER — Telehealth: Payer: Self-pay | Admitting: *Deleted

## 2016-06-20 NOTE — Telephone Encounter (Signed)
06/19/16: Clearance for REVERSED TOTAL RIGHT SHOULDER faxed to [facility]  Clearance states she is a low to intermediate risk for an intermediate risk procedure.

## 2016-06-25 ENCOUNTER — Encounter (HOSPITAL_COMMUNITY): Payer: Self-pay

## 2016-06-25 ENCOUNTER — Encounter (HOSPITAL_COMMUNITY)
Admission: RE | Admit: 2016-06-25 | Discharge: 2016-06-25 | Disposition: A | Discharge status: Home / Self Care | Payer: Medicare PPO | Source: Ambulatory Visit | Attending: Orthopedic Surgery | Admitting: Orthopedic Surgery

## 2016-06-25 DIAGNOSIS — Z01812 Encounter for preprocedural laboratory examination: Secondary | ICD-10-CM | POA: Insufficient documentation

## 2016-06-25 HISTORY — DX: Other specified postprocedural states: R11.2

## 2016-06-25 HISTORY — DX: Unspecified osteoarthritis, unspecified site: M19.90

## 2016-06-25 HISTORY — DX: Unspecified right bundle-branch block: I45.10

## 2016-06-25 HISTORY — DX: Other specified postprocedural states: Z98.890

## 2016-06-25 LAB — SURGICAL PCR SCREEN
MRSA, PCR: NEGATIVE
STAPHYLOCOCCUS AUREUS: POSITIVE — AB

## 2016-06-25 LAB — COMPREHENSIVE METABOLIC PANEL
ALK PHOS: 74 U/L (ref 38–126)
ALT: 26 U/L (ref 14–54)
ANION GAP: 8 (ref 5–15)
AST: 26 U/L (ref 15–41)
Albumin: 4 g/dL (ref 3.5–5.0)
BUN: 16 mg/dL (ref 6–20)
CHLORIDE: 102 mmol/L (ref 101–111)
CO2: 26 mmol/L (ref 22–32)
Calcium: 9.5 mg/dL (ref 8.9–10.3)
Creatinine, Ser: 0.78 mg/dL (ref 0.44–1.00)
GFR calc non Af Amer: >60 mL/min (ref >60)
GLUCOSE: 101 mg/dL — AB (ref 65–99)
POTASSIUM: 4.3 mmol/L (ref 3.5–5.1)
Sodium: 136 mmol/L (ref 135–145)
Total Bilirubin: 0.6 mg/dL (ref 0.3–1.2)
Total Protein: 6.5 g/dL (ref 6.5–8.1)

## 2016-06-25 LAB — CBC
HEMATOCRIT: 42.7 % (ref 36.0–46.0)
Hemoglobin: 14.1 g/dL (ref 12.0–15.0)
MCH: 33.5 pg (ref 26.0–34.0)
MCHC: 33 g/dL (ref 30.0–36.0)
MCV: 101.4 fL — AB (ref 78.0–100.0)
Platelets: 276 10*3/uL (ref 150–400)
RBC: 4.21 MIL/uL (ref 3.87–5.11)
RDW: 11.8 % (ref 11.5–15.5)
WBC: 5.7 10*3/uL (ref 4.0–10.5)

## 2016-06-25 NOTE — Progress Notes (Signed)
Currently denies chest pain.  She did experience some Syncope x 2 awhile ago.  Wore a heart monitor and nothing definitive, except RBBB showed up.  Saw Dr. Curt Bears @ [facility] did and EKG & Echo (clearance note inside chart)  LOV 12/ 2017 PCP is Dr. Harlan Stains   LOV 04/2016

## 2016-06-25 NOTE — Pre-Procedure Instructions (Addendum)
    CAYE SOLLIS  06/25/2016      [id number] - [place] [place] [place] [address] Phone: [phone] Fax: [phone]    Your procedure is scheduled on Thursday, January 11th   Report to [facility] Admitting at 5:30 AM.             (posted surgery time 7:30 - 9:30 AM)   Call this number if you have problems the MORNING of surgery:  [phone]   Remember:  Do not eat food or drink liquids after midnight Wednesday.   Take these medicines the morning of surgery with A SIP OF WATER : Hydrocodone, Zantac               4-5 days prior to surgery, STOP TAKING any Vitamins, Herbal Supplements, Anti-inflammatories   Do not wear jewelry, make-up or nail polish.  Do not wear lotions, powders, perfumes, or deoderant.   Do not shave underarms & legs 48 hours prior to surgery.    Do not bring valuables to the hospital.  [facility] is not responsible for any belongings or valuables.  Contacts, dentures or bridgework may not be worn into surgery.  Leave your suitcase in the car.  After surgery it may be brought to your room. For patients admitted to the hospital, discharge time will be determined by your treatment team.   Please read over the following fact sheets that you were given. Pain Booklet, MRSA Information and Surgical Site Infection Prevention

## 2016-06-26 NOTE — Progress Notes (Signed)
Anesthesia chart review: Patient is a 71 year old female scheduled for right shoulder reverse arthroplasty on 06/28/2016 by Dr. Onnie Graham.  History includes syncope (04/2016 and 03/2016; EP work-up negative), right BBB, nonsmoker, hypertension, GERD, postoperative N/V, arthritis, cataracts, nerve pain ("cyst in lower lumbar spine"), C6-7 ACDF '03.   - PCP is Dr. Harlan Stains. - Cardiologist is Dr. Curt Bears, last visit 06/15/16. TTE and event monitor were unremarkable, so no further cardiac work-up was recommended for evaluation of synnope. He felt she was "low to intermediate risk" for surgery. She can follow-up as needed.  Meds include calcium, estradiol, Neurontin, Norco, lisinopril, progesterone, Zantac.  BP (!) 146/56   Pulse 86   Temp 36.6 C   Resp 18   Ht 5\' 3"  (1.6 m)   Wt 115 lb 12.8 oz (52.5 kg)   SpO2 98%   BMI 20.51 kg/m   EKG: NSR, right BBB, LAFB.  Echo 06/07/16: Study Conclusions - Left ventricle: The cavity size was normal. Wall thickness was   normal. Systolic function was normal. The estimated ejection   fraction was in the range of 60% to 65%. Wall motion was normal;   there were no regional wall motion abnormalities. Doppler   parameters are consistent with abnormal left ventricular   relaxation (grade 1 diastolic dysfunction). The E/e&' ratio is <8,   suggesting normal LV filling pressure. - Left atrium: The atrium was normal in size. - Inferior vena cava: The vessel was normal in size. The   respirophasic diameter changes were in the normal range (>= 50%),   consistent with normal central venous pressure. Impressions: - LVEF 60-65%, normal wall thickness, normal wall motion, diastolic   dysfunction, normal LV filling pressure, normal LA size, normal   IVC.  30 day event monitor 05/24/16-06/06/16: Baseline of sinus rhythm at 69 bpm. 0 critical events. 0 serious events. 2 stable events (automatically detected events that showed sinus rhythm and sinus rhythm with  sinus arrhythmia). Highlights: Sinus rhythm with sinus tachycardia.  EKG 05/17/16: NSR, right BBB, LAD, consider LAFB.  Preoperative labs noted.   If no acute changes then I anticipate she can proceed as planned.  George Hugh [facility] Short Stay Center/Anesthesiology Phone [phone] 06/26/2016 12:16 PM

## 2016-06-27 MED ORDER — SODIUM CHLORIDE 0.9 % IV SOLN
1000.0000 mg | INTRAVENOUS | Status: AC
  Administered 2016-06-28: 1000 mg via INTRAVENOUS
  Filled 2016-06-27: qty 10

## 2016-06-27 MED ORDER — CLINDAMYCIN PHOSPHATE 900 MG/50ML IV SOLN
900.0000 mg | INTRAVENOUS | Status: AC
  Administered 2016-06-28: 900 mg via INTRAVENOUS
  Filled 2016-06-27 (×3): qty 50

## 2016-06-27 MED ORDER — CHLORHEXIDINE GLUCONATE 4 % EX LIQD: 60.0000 mL | Freq: Once | CUTANEOUS | Status: DC

## 2016-06-27 NOTE — Anesthesia Preprocedure Evaluation (Addendum)
Anesthesia Evaluation  Patient identified by MRN, date of birth, ID band Patient awake    Reviewed: Allergy & Precautions, NPO status , Patient's Chart, lab work & pertinent test results  History of Anesthesia Complications (+) PONV  Airway Mallampati: II  TM Distance: >3 FB Neck ROM: Full    Dental  (+) Dental Advisory Given   Pulmonary neg pulmonary ROS,    breath sounds clear to auscultation       Cardiovascular hypertension, Pt. on medications + dysrhythmias  Rhythm:Regular Rate:Normal     Neuro/Psych negative neurological ROS     GI/Hepatic Neg liver ROS, GERD  ,  Endo/Other  negative endocrine ROS  Renal/GU negative Renal ROS     Musculoskeletal  (+) Arthritis ,   Abdominal   Peds  Hematology negative hematology ROS (+)   Anesthesia Other Findings   Reproductive/Obstetrics                            Lab Results  Component Value Date   WBC 5.7 06/25/2016   HGB 14.1 06/25/2016   HCT 42.7 06/25/2016   MCV 101.4 (H) 06/25/2016   PLT 276 06/25/2016   Lab Results  Component Value Date   CREATININE 0.78 06/25/2016   BUN 16 06/25/2016   NA 136 06/25/2016   K 4.3 06/25/2016   CL 102 06/25/2016   CO2 26 06/25/2016    Anesthesia Physical Anesthesia Plan  ASA: II  Anesthesia Plan: General   Post-op Pain Management:  Regional for Post-op pain   Induction: Intravenous  Airway Management Planned: Oral ETT  Additional Equipment:   Intra-op Plan:   Post-operative Plan: Extubation in OR  Informed Consent: I have reviewed the patients History and Physical, chart, labs and discussed the procedure including the risks, benefits and alternatives for the proposed anesthesia with the patient or authorized representative who has indicated his/her understanding and acceptance.   Dental advisory given  Plan Discussed with: CRNA  Anesthesia Plan Comments:        Anesthesia  Quick Evaluation

## 2016-06-28 ENCOUNTER — Encounter (HOSPITAL_COMMUNITY)
Admission: RE | Disposition: A | Discharge status: Home Health | Payer: Self-pay | Source: Ambulatory Visit | Attending: Orthopedic Surgery

## 2016-06-28 ENCOUNTER — Inpatient Hospital Stay (HOSPITAL_COMMUNITY): Payer: Medicare PPO | Admitting: Anesthesiology

## 2016-06-28 ENCOUNTER — Institutional Professional Consult (permissible substitution): Payer: Medicare PPO | Admitting: Cardiology

## 2016-06-28 ENCOUNTER — Inpatient Hospital Stay (HOSPITAL_COMMUNITY)
Admission: RE | Admit: 2016-06-28 | Discharge: 2016-06-29 | DRG: 483 | Disposition: A | Discharge status: Home Health | Payer: Medicare PPO | Source: Ambulatory Visit | Attending: Orthopedic Surgery | Admitting: Orthopedic Surgery

## 2016-06-28 ENCOUNTER — Encounter (HOSPITAL_COMMUNITY): Payer: Self-pay | Admitting: *Deleted

## 2016-06-28 ENCOUNTER — Inpatient Hospital Stay (HOSPITAL_COMMUNITY): Payer: Medicare PPO | Admitting: Vascular Surgery

## 2016-06-28 DIAGNOSIS — Z880 Allergy status to penicillin: Secondary | ICD-10-CM | POA: Diagnosis not present

## 2016-06-28 DIAGNOSIS — M12811 Other specific arthropathies, not elsewhere classified, right shoulder: Secondary | ICD-10-CM | POA: Diagnosis present

## 2016-06-28 DIAGNOSIS — H269 Unspecified cataract: Secondary | ICD-10-CM | POA: Diagnosis present

## 2016-06-28 DIAGNOSIS — Z8249 Family history of ischemic heart disease and other diseases of the circulatory system: Secondary | ICD-10-CM

## 2016-06-28 DIAGNOSIS — K219 Gastro-esophageal reflux disease without esophagitis: Secondary | ICD-10-CM | POA: Diagnosis present

## 2016-06-28 DIAGNOSIS — Z96611 Presence of right artificial shoulder joint: Secondary | ICD-10-CM

## 2016-06-28 DIAGNOSIS — I10 Essential (primary) hypertension: Secondary | ICD-10-CM | POA: Diagnosis present

## 2016-06-28 DIAGNOSIS — Z91018 Allergy to other foods: Secondary | ICD-10-CM | POA: Diagnosis not present

## 2016-06-28 DIAGNOSIS — M75101 Unspecified rotator cuff tear or rupture of right shoulder, not specified as traumatic: Secondary | ICD-10-CM | POA: Diagnosis present

## 2016-06-28 DIAGNOSIS — I4510 Unspecified right bundle-branch block: Secondary | ICD-10-CM | POA: Diagnosis present

## 2016-06-28 HISTORY — PX: REVERSE SHOULDER ARTHROPLASTY: SHX5054

## 2016-06-28 SURGERY — ARTHROPLASTY, SHOULDER, TOTAL, REVERSE
Anesthesia: General | Laterality: Right

## 2016-06-28 MED ORDER — MIDAZOLAM HCL 2 MG/2ML IJ SOLN
INTRAMUSCULAR | Status: AC
  Filled 2016-06-28: qty 2

## 2016-06-28 MED ORDER — MAGNESIUM CITRATE PO SOLN: 1.0000 | Freq: Once | ORAL | Status: DC | PRN

## 2016-06-28 MED ORDER — PHENOL 1.4 % MT LIQD: 1.0000 | OROMUCOSAL | Status: DC | PRN

## 2016-06-28 MED ORDER — ACETAMINOPHEN 650 MG RE SUPP: 650.0000 mg | Freq: Four times a day (QID) | RECTAL | Status: DC | PRN

## 2016-06-28 MED ORDER — DIAZEPAM 5 MG PO TABS
2.5000 mg | ORAL_TABLET | Freq: Four times a day (QID) | ORAL | Status: DC | PRN
  Administered 2016-06-28 – 2016-06-29 (×2): 5 mg via ORAL
  Filled 2016-06-28 (×3): qty 1

## 2016-06-28 MED ORDER — FENTANYL CITRATE (PF) 100 MCG/2ML IJ SOLN
INTRAMUSCULAR | Status: AC
  Filled 2016-06-28: qty 2

## 2016-06-28 MED ORDER — HYDROMORPHONE HCL 1 MG/ML IJ SOLN: 0.5000 mg | INTRAMUSCULAR | Status: DC | PRN

## 2016-06-28 MED ORDER — BISACODYL 5 MG PO TBEC: 5.0000 mg | DELAYED_RELEASE_TABLET | Freq: Every day | ORAL | Status: DC | PRN

## 2016-06-28 MED ORDER — MIDAZOLAM HCL 5 MG/5ML IJ SOLN
INTRAMUSCULAR | Status: DC | PRN
  Administered 2016-06-28: 1 mg via INTRAVENOUS

## 2016-06-28 MED ORDER — PROPOFOL 10 MG/ML IV BOLUS
INTRAVENOUS | Status: DC | PRN
  Administered 2016-06-28: 100 mg via INTRAVENOUS

## 2016-06-28 MED ORDER — ALUM & MAG HYDROXIDE-SIMETH 200-200-20 MG/5ML PO SUSP: 30.0000 mL | ORAL | Status: DC | PRN

## 2016-06-28 MED ORDER — BUPIVACAINE-EPINEPHRINE (PF) 0.5% -1:200000 IJ SOLN
INTRAMUSCULAR | Status: DC | PRN
  Administered 2016-06-28: 20 mL via PERINEURAL

## 2016-06-28 MED ORDER — SODIUM CHLORIDE 0.9 % IV SOLN
INTRAVENOUS | Status: DC | PRN
  Administered 2016-06-28: 25 ug/min via INTRAVENOUS

## 2016-06-28 MED ORDER — FAMOTIDINE 20 MG PO TABS
20.0000 mg | ORAL_TABLET | Freq: Two times a day (BID) | ORAL | Status: DC
  Administered 2016-06-28 – 2016-06-29 (×2): 20 mg via ORAL
  Filled 2016-06-28 (×2): qty 1

## 2016-06-28 MED ORDER — HYDROMORPHONE HCL 1 MG/ML IJ SOLN
0.2500 mg | INTRAMUSCULAR | Status: DC | PRN
  Administered 2016-06-28: 0.5 mg via INTRAVENOUS

## 2016-06-28 MED ORDER — PROPOFOL 10 MG/ML IV BOLUS
INTRAVENOUS | Status: AC
  Filled 2016-06-28: qty 20

## 2016-06-28 MED ORDER — POLYETHYLENE GLYCOL 3350 17 G PO PACK: 17.0000 g | PACK | Freq: Every day | ORAL | Status: DC | PRN

## 2016-06-28 MED ORDER — MENTHOL 3 MG MT LOZG: 1.0000 | LOZENGE | OROMUCOSAL | Status: DC | PRN

## 2016-06-28 MED ORDER — ONDANSETRON HCL 4 MG PO TABS: 4.0000 mg | ORAL_TABLET | Freq: Four times a day (QID) | ORAL | Status: DC | PRN

## 2016-06-28 MED ORDER — LACTATED RINGERS IV SOLN: INTRAVENOUS | Status: DC

## 2016-06-28 MED ORDER — KETOROLAC TROMETHAMINE 15 MG/ML IJ SOLN
7.5000 mg | Freq: Four times a day (QID) | INTRAMUSCULAR | Status: AC
  Administered 2016-06-28 – 2016-06-29 (×4): 7.5 mg via INTRAVENOUS
  Filled 2016-06-28 (×4): qty 1

## 2016-06-28 MED ORDER — CLINDAMYCIN PHOSPHATE 600 MG/50ML IV SOLN
600.0000 mg | Freq: Four times a day (QID) | INTRAVENOUS | Status: AC
Start: 2016-06-28 — End: 2016-06-29
  Administered 2016-06-28 – 2016-06-29 (×3): 600 mg via INTRAVENOUS
  Filled 2016-06-28 (×3): qty 50

## 2016-06-28 MED ORDER — ONDANSETRON HCL 4 MG/2ML IJ SOLN
INTRAMUSCULAR | Status: DC | PRN
  Administered 2016-06-28: 4 mg via INTRAVENOUS

## 2016-06-28 MED ORDER — PHENYLEPHRINE HCL 10 MG/ML IJ SOLN
INTRAMUSCULAR | Status: DC | PRN
  Administered 2016-06-28 (×2): 80 ug via INTRAVENOUS

## 2016-06-28 MED ORDER — DOCUSATE SODIUM 100 MG PO CAPS
100.0000 mg | ORAL_CAPSULE | Freq: Two times a day (BID) | ORAL | Status: DC
  Administered 2016-06-28 – 2016-06-29 (×3): 100 mg via ORAL
  Filled 2016-06-28 (×3): qty 1

## 2016-06-28 MED ORDER — LISINOPRIL 20 MG PO TABS
10.0000 mg | ORAL_TABLET | Freq: Every day | ORAL | Status: DC
  Administered 2016-06-28 – 2016-06-29 (×2): 10 mg via ORAL
  Filled 2016-06-28 (×2): qty 1

## 2016-06-28 MED ORDER — GABAPENTIN 300 MG PO CAPS
300.0000 mg | ORAL_CAPSULE | Freq: Every day | ORAL | Status: DC
  Administered 2016-06-28: 300 mg via ORAL
  Filled 2016-06-28: qty 1

## 2016-06-28 MED ORDER — ONDANSETRON HCL 4 MG/2ML IJ SOLN: 4.0000 mg | Freq: Four times a day (QID) | INTRAMUSCULAR | Status: DC | PRN

## 2016-06-28 MED ORDER — ACETAMINOPHEN 325 MG PO TABS: 650.0000 mg | ORAL_TABLET | Freq: Four times a day (QID) | ORAL | Status: DC | PRN

## 2016-06-28 MED ORDER — EPHEDRINE SULFATE 50 MG/ML IJ SOLN
INTRAMUSCULAR | Status: DC | PRN
  Administered 2016-06-28: 10 mg via INTRAVENOUS

## 2016-06-28 MED ORDER — LIDOCAINE HCL (CARDIAC) 20 MG/ML IV SOLN
INTRAVENOUS | Status: DC | PRN
  Administered 2016-06-28: 20 mg via INTRAVENOUS

## 2016-06-28 MED ORDER — FENTANYL CITRATE (PF) 100 MCG/2ML IJ SOLN
INTRAMUSCULAR | Status: DC | PRN
  Administered 2016-06-28 (×2): 50 ug via INTRAVENOUS

## 2016-06-28 MED ORDER — METOCLOPRAMIDE HCL 5 MG/ML IJ SOLN: 5.0000 mg | Freq: Three times a day (TID) | INTRAMUSCULAR | Status: DC | PRN

## 2016-06-28 MED ORDER — METOCLOPRAMIDE HCL 5 MG PO TABS: 5.0000 mg | ORAL_TABLET | Freq: Three times a day (TID) | ORAL | Status: DC | PRN

## 2016-06-28 MED ORDER — HYDROMORPHONE HCL 1 MG/ML IJ SOLN
INTRAMUSCULAR | Status: AC
  Filled 2016-06-28: qty 0.5

## 2016-06-28 MED ORDER — SODIUM CHLORIDE 0.9 % IR SOLN
Status: DC | PRN
  Administered 2016-06-28: 1000 mL

## 2016-06-28 MED ORDER — OXYCODONE HCL 5 MG PO TABS
5.0000 mg | ORAL_TABLET | ORAL | Status: DC | PRN
  Administered 2016-06-28: 5 mg via ORAL
  Administered 2016-06-29 (×3): 10 mg via ORAL
  Filled 2016-06-28 (×2): qty 2
  Filled 2016-06-28: qty 1
  Filled 2016-06-28: qty 2

## 2016-06-28 MED ORDER — ROCURONIUM BROMIDE 100 MG/10ML IV SOLN
INTRAVENOUS | Status: DC | PRN
  Administered 2016-06-28: 30 mg via INTRAVENOUS

## 2016-06-28 MED ORDER — MUPIROCIN 2 % EX OINT
1.0000 "application " | TOPICAL_OINTMENT | Freq: Two times a day (BID) | CUTANEOUS | Status: DC
  Administered 2016-06-28: 1 via NASAL

## 2016-06-28 MED ORDER — SUGAMMADEX SODIUM 200 MG/2ML IV SOLN
INTRAVENOUS | Status: DC | PRN
  Administered 2016-06-28: 150 mg via INTRAVENOUS

## 2016-06-28 MED ORDER — LACTATED RINGERS IV SOLN
INTRAVENOUS | Status: DC | PRN
  Administered 2016-06-28: 07:00:00 via INTRAVENOUS

## 2016-06-28 SURGICAL SUPPLY — 67 items
ADH SKN CLS APL DERMABOND .7 (GAUZE/BANDAGES/DRESSINGS)
AID PSTN UNV HD RSTRNT DISP (MISCELLANEOUS) ×1
BASEPLATE GLENOID SHLDR SM (Shoulder) ×2 IMPLANT
BLADE SAW SGTL 83.5X18.5 (BLADE) ×3 IMPLANT
BSPLAT GLND SM PRFT SHLDR CA (Shoulder) ×1 IMPLANT
COVER SURGICAL LIGHT HANDLE (MISCELLANEOUS) ×3 IMPLANT
CUP SUT UNIV REVERS 36+2 RT (Cup) ×2 IMPLANT
DERMABOND ADVANCED (GAUZE/BANDAGES/DRESSINGS)
DERMABOND ADVANCED .7 DNX12 (GAUZE/BANDAGES/DRESSINGS) ×1 IMPLANT
DRAPE ORTHO SPLIT 77X108 STRL (DRAPES) ×6
DRAPE SURG 17X11 SM STRL (DRAPES) ×3 IMPLANT
DRAPE SURG ORHT 6 SPLT 77X108 (DRAPES) ×2 IMPLANT
DRAPE U-SHAPE 47X51 STRL (DRAPES) ×3 IMPLANT
DRSG AQUACEL AG ADV 3.5X10 (GAUZE/BANDAGES/DRESSINGS) ×1 IMPLANT
DURAPREP 26ML APPLICATOR (WOUND CARE) ×5 IMPLANT
ELECT BLADE 4.0 EZ CLEAN MEGAD (MISCELLANEOUS) ×3
ELECT CAUTERY BLADE 6.4 (BLADE) ×3 IMPLANT
ELECT REM PT RETURN 9FT ADLT (ELECTROSURGICAL) ×3
ELECTRODE BLDE 4.0 EZ CLN MEGD (MISCELLANEOUS) ×1 IMPLANT
ELECTRODE REM PT RTRN 9FT ADLT (ELECTROSURGICAL) ×1 IMPLANT
FACESHIELD WRAPAROUND (MASK) ×9 IMPLANT
FACESHIELD WRAPAROUND OR TEAM (MASK) ×3 IMPLANT
GLENOSPHERE LATERAL 36MM+4 (Shoulder) ×2 IMPLANT
GLOVE BIO SURGEON STRL SZ7.5 (GLOVE) ×3 IMPLANT
GLOVE BIO SURGEON STRL SZ8 (GLOVE) ×3 IMPLANT
GLOVE EUDERMIC 7 POWDERFREE (GLOVE) ×3 IMPLANT
GLOVE SS BIOGEL STRL SZ 7.5 (GLOVE) ×1 IMPLANT
GLOVE SUPERSENSE BIOGEL SZ 7.5 (GLOVE) ×2
GOWN STRL REUS W/ TWL LRG LVL3 (GOWN DISPOSABLE) IMPLANT
GOWN STRL REUS W/ TWL XL LVL3 (GOWN DISPOSABLE) ×2 IMPLANT
GOWN STRL REUS W/TWL LRG LVL3 (GOWN DISPOSABLE)
GOWN STRL REUS W/TWL XL LVL3 (GOWN DISPOSABLE) ×6
INSERT HUMERAL 36 +6 (Shoulder) ×2 IMPLANT
KIT BASIN OR (CUSTOM PROCEDURE TRAY) ×3 IMPLANT
KIT ROOM TURNOVER OR (KITS) ×3 IMPLANT
MANIFOLD NEPTUNE II (INSTRUMENTS) ×3 IMPLANT
NDL SUT .5 MAYO 1.404X.05X (NEEDLE) ×1 IMPLANT
NEEDLE HYPO 25GX1X1/2 BEV (NEEDLE) IMPLANT
NEEDLE MAYO TAPER (NEEDLE) ×3
NS IRRIG 1000ML POUR BTL (IV SOLUTION) ×3 IMPLANT
PACK SHOULDER (CUSTOM PROCEDURE TRAY) ×3 IMPLANT
PAD ARMBOARD 7.5X6 YLW CONV (MISCELLANEOUS) ×6 IMPLANT
PASSER SUT SWANSON 36MM LOOP (INSTRUMENTS) IMPLANT
RESTRAINT HEAD UNIVERSAL NS (MISCELLANEOUS) ×3 IMPLANT
SCREW CENTRAL NONLOCK 6.5X20MM (Shoulder) ×2 IMPLANT
SCREW LOCK GLENOID UNI PERI (Screw) ×3 IMPLANT
SCREW LOCK PERIPHERAL 30MM (Shoulder) ×2 IMPLANT
SET PIN UNIVERSAL REVERSE (SET/KITS/TRAYS/PACK) ×3 IMPLANT
SLING ARM FOAM STRAP LRG (SOFTGOODS) IMPLANT
SPONGE LAP 18X18 X RAY DECT (DISPOSABLE) ×3 IMPLANT
SPONGE LAP 4X18 X RAY DECT (DISPOSABLE) ×3 IMPLANT
STEM HUMERAL UNI REVERS SZ6 (Stem) ×2 IMPLANT
SUCTION FRAZIER HANDLE 10FR (MISCELLANEOUS) ×2
SUCTION TUBE FRAZIER 10FR DISP (MISCELLANEOUS) ×1 IMPLANT
SUT BONE WAX W31G (SUTURE) IMPLANT
SUT FIBERWIRE #2 38 T-5 BLUE (SUTURE) ×6
SUT MNCRL AB 3-0 PS2 18 (SUTURE) ×3 IMPLANT
SUT MON AB 2-0 CT1 36 (SUTURE) ×3 IMPLANT
SUT VIC AB 1 CT1 27 (SUTURE) ×3
SUT VIC AB 1 CT1 27XBRD ANBCTR (SUTURE) ×1 IMPLANT
SUT VIC AB 2-0 CT1 27 (SUTURE)
SUT VIC AB 2-0 CT1 TAPERPNT 27 (SUTURE) IMPLANT
SUTURE FIBERWR #2 38 T-5 BLUE (SUTURE) ×2 IMPLANT
SYR CONTROL 10ML LL (SYRINGE) IMPLANT
TOWEL OR 17X24 6PK STRL BLUE (TOWEL DISPOSABLE) ×3 IMPLANT
TOWEL OR 17X26 10 PK STRL BLUE (TOWEL DISPOSABLE) ×3 IMPLANT
WATER STERILE IRR 1000ML POUR (IV SOLUTION) ×1 IMPLANT

## 2016-06-28 NOTE — Anesthesia Procedure Notes (Signed)
Procedure Name: Intubation Date/Time: 06/28/2016 7:41 AM Performed by: Clearnce Sorrel Pre-anesthesia Checklist: Patient identified, Emergency Drugs available, Suction available, Patient being monitored and Timeout performed Patient Re-evaluated:Patient Re-evaluated prior to inductionOxygen Delivery Method: Circle system utilized Preoxygenation: Pre-oxygenation with 100% oxygen Intubation Type: IV induction Ventilation: Mask ventilation without difficulty Laryngoscope Size: Glidescope and 3 Grade View: Grade I Tube type: Oral Tube size: 7.0 mm Number of attempts: 1 Airway Equipment and Method: Stylet Placement Confirmation: ETT inserted through vocal cords under direct vision,  positive ETCO2 and breath sounds checked- equal and bilateral Secured at: 23 cm Tube secured with: Tape Dental Injury: Teeth and Oropharynx as per pre-operative assessment

## 2016-06-28 NOTE — Discharge Instructions (Signed)

## 2016-06-28 NOTE — Anesthesia Postprocedure Evaluation (Signed)
Anesthesia Post Note  Patient: Karen Gomez  Procedure(s) Performed: Procedure(s) (LRB): REVERSE SHOULDER ARTHROPLASTY (Right)  Patient location during evaluation: PACU Anesthesia Type: General and Regional Level of consciousness: awake and alert Pain management: pain level controlled Vital Signs Assessment: post-procedure vital signs reviewed and stable Respiratory status: spontaneous breathing, nonlabored ventilation, respiratory function stable and patient connected to nasal cannula oxygen Cardiovascular status: blood pressure returned to baseline and stable Postop Assessment: no signs of nausea or vomiting Anesthetic complications: no       Last Vitals:  Vitals:   06/28/16 1003 06/28/16 1119  BP:  126/64  Pulse:    Resp:    Temp: 36.5 C 36.6 C    Last Pain:  Vitals:   06/28/16 1030  TempSrc:   PainSc: 0-No pain                 Tiajuana Amass

## 2016-06-28 NOTE — Transfer of Care (Signed)
Immediate Anesthesia Transfer of Care Note  Patient: Karen Gomez  Procedure(s) Performed: Procedure(s): REVERSE SHOULDER ARTHROPLASTY (Right)  Patient Location: PACU  Anesthesia Type:General  Level of Consciousness: awake, alert  and oriented  Airway & Oxygen Therapy: Patient Spontanous Breathing  Post-op Assessment: Report given to RN and Post -op Vital signs reviewed and stable  Post vital signs: Reviewed and stable  Last Vitals:  Vitals:   06/28/16 0608  BP: (!) 148/54  Pulse: 67  Resp: 18  Temp: 36.6 C    Last Pain:  Vitals:   06/28/16 0613  TempSrc:   PainSc: 7       Patients Stated Pain Goal: 1 ([id number])  Complications: No apparent anesthesia complications

## 2016-06-28 NOTE — Op Note (Signed)
06/28/2016  10:03 AM  PATIENT:   Karen Gomez  71 y.o. female  PRE-OPERATIVE DIAGNOSIS:  Right shoulder rotator cuff tear arthropathy  POST-OPERATIVE DIAGNOSIS:  same  PROCEDURE:  R RSA #6 stem, +6 poly, 36/+4 glenosphere  SURGEON:  Keelin Neville, Metta Clines M.D.  ASSISTANTS: Shuford pac   ANESTHESIA:   GET + ISB  EBL: 150  SPECIMEN:  none  Drains: none   PATIENT DISPOSITION:  PACU - hemodynamically stable.    PLAN OF CARE: Admit for overnight observation  Dictation# [id number]   Contact # [phone]

## 2016-06-28 NOTE — Evaluation (Signed)
Physical Therapy Evaluation Patient Details Name: Karen Gomez MRN: [id number] DOB: August 27, 1945 Today's Date: 06/28/2016   History of Present Illness  The patient is a 71 y.o. female with end stage right shoulder rotator cuff tear arthropathy and HTN, presenting for R RSA.  Clinical Impression  Pt admitted with/for the shoulder surgery above.  Basic mobility is at a supervision to mod I level and pt will have family assist..  Pt currently limited functionally due to the problems listed below.  (see problems list.)  Pt will benefit from PT to maximize function and safety to be able to get home safely with available assist .     Follow Up Recommendations Home health PT;Other (comment) (home safety evaluation)    Equipment Recommendations  None recommended by PT    Recommendations for Other Services       Precautions / Restrictions Precautions Precautions: Shoulder Type of Shoulder Precautions: Wear sling at all times except during ADL's and exercise.  Pt can come out of sling when in a supported position for brief periods.  Exercise of shoulder to active protocol overseen by OT's Shoulder Interventions: Shoulder sling/immobilizer;Off for dressing/bathing/exercises Required Braces or Orthoses: Sling Restrictions Weight Bearing Restrictions: Yes Other Position/Activity Restrictions: NWB      Mobility  Bed Mobility Overal bed mobility: Needs Assistance Bed Mobility: Supine to Sit;Sit to Supine     Supine to sit: Supervision;HOB elevated Sit to supine: Supervision   General bed mobility comments: struggle coming up without HOB elevated  Transfers Overall transfer level: Needs assistance   Transfers: Sit to/from Stand Sit to Stand: Supervision            Ambulation/Gait Ambulation/Gait assistance: Min guard Ambulation Distance (Feet): 200 Feet Assistive device: None (iv pole) Gait Pattern/deviations: Step-through pattern   Gait velocity interpretation: Below normal  speed for age/gender General Gait Details: steady with slower cadence  Stairs            Wheelchair Mobility    Modified Rankin (Stroke Patients Only)       Balance Overall balance assessment: No apparent balance deficits (not formally assessed)                                           Pertinent Vitals/Pain Pain Assessment: Faces Faces Pain Scale: Hurts a little bit Pain Location: shoulder Pain Descriptors / Indicators: Numbness Pain Intervention(s): Ice applied    Home Living Family/patient expects to be discharged to:: Private residence Living Arrangements: Alone Available Help at Discharge: Family;Available PRN/intermittently;Other (Comment) (Sister in law to come stay with her for a week) Type of Home: House Home Access: Stairs to enter     Home Layout: Two level;Bed/bath upstairs Home Equipment: Shower seat      Prior Function Level of Independence: Independent               Hand Dominance        Extremity/Trunk Assessment   Upper Extremity Assessment Upper Extremity Assessment: Defer to OT evaluation    Lower Extremity Assessment Lower Extremity Assessment: Overall WFL for tasks assessed    Cervical / Trunk Assessment Cervical / Trunk Assessment: Normal  Communication   Communication: No difficulties  Cognition Arousal/Alertness: Awake/alert Behavior During Therapy: WFL for tasks assessed/performed Overall Cognitive Status: Within Functional Limits for tasks assessed  General Comments General comments (skin integrity, edema, etc.): Initiated shoulder education, progression of activity    Exercises     Assessment/Plan    PT Assessment Patient needs continued PT services  PT Problem List Decreased knowledge of precautions          PT Treatment Interventions Gait training;Stair training;Functional mobility training;Therapeutic activities;Patient/family education    PT Goals  (Current goals can be found in the Care Plan section)  Acute Rehab PT Goals Patient Stated Goal: Independent/ functional use of her right arm  "I want to be able to put curlers in my hair with the right arm. PT Goal Formulation: With patient Time For Goal Achievement: 07/05/16 Potential to Achieve Goals: Good    Frequency Min 3X/week   Barriers to discharge        Co-evaluation               End of Session   Activity Tolerance: Patient tolerated treatment well Patient left: in bed;with call bell/phone within reach;with family/visitor present Nurse Communication: Mobility status         Time: [id number] PT Time Calculation (min) (ACUTE ONLY): 29 min   Charges:   PT Evaluation $PT Eval Low Complexity: 1 Procedure PT Treatments $Gait Training: 8-22 mins   PT G CodesTessie Fass Karen Gomez 06/28/2016, 6:18 PM 06/28/2016  Karen Gomez, PT [phone]  (pager)

## 2016-06-28 NOTE — H&P (Signed)
Karen Gomez    Chief Complaint: Right shoulder rotator cuff tear arthropathy HPI: The patient is a 71 y.o. female with end stage right shoulder rotator cuff tear arthropathy  Past Medical History:  Diagnosis Date  . Arthritis   . Cataracts, bilateral   . Complication of anesthesia    WHEN SHE HAD NECK SURGERY ?2002, HAD SOME N/V, NONE WITH HER OTHER SURGERIES  . GERD (gastroesophageal reflux disease)   . Hypertension   . Nerve pain    CYST IN LOWER LUMBAR SPINE  . PONV (postoperative nausea and vomiting)   . RBBB     Past Surgical History:  Procedure Laterality Date  . CESAREAN SECTION     X 2  . DILATION AND CURETTAGE OF UTERUS     PART OF UTERINE POLYP REMOVAL  . NECK SURGERY     X 1 PLACEMENT OF TITANIUM PLATE  . UTERINE POLYPS      Family History  Problem Relation Age of Onset  . Dementia Mother   . Heart disease Father   . Heart disease Brother     Social History:  reports that she has never smoked. She has never used smokeless tobacco. She reports that she drinks about 8.4 oz of alcohol per week . She reports that she does not use drugs.   Medications Prior to Admission  Medication Sig Dispense Refill  . Calcium Citrate-Vitamin D (CALCIUM + D PO) Take 1 tablet by mouth 2 (two) times daily.    . calcium elemental as carbonate (TUMS ULTRA 1000) 400 MG chewable tablet Chew 1,000 mg by mouth daily as needed for heartburn.    . cholecalciferol (VITAMIN D) 1000 units tablet Take 1,000 Units by mouth daily.    Marland Kitchen estradiol (ESTRACE) 0.5 MG tablet Take 0.5 mg by mouth daily.    Marland Kitchen gabapentin (NEURONTIN) 300 MG capsule Take 300 mg by mouth at bedtime.     Marland Kitchen HYDROcodone-acetaminophen (NORCO/VICODIN) 5-325 MG per tablet Take 0.5 tablets by mouth daily. Takes a half of a tablet daily for chronic back pain    . lisinopril (PRINIVIL,ZESTRIL) 10 MG tablet Take 10 mg by mouth daily.    . Multiple Vitamin (MULTIVITAMIN) tablet Take 1 tablet by mouth daily.    . progesterone  (PROMETRIUM) 100 MG capsule Take 100 mg by mouth at bedtime.     . ranitidine (ZANTAC) 300 MG tablet Take 300 mg by mouth daily as needed for heartburn.       Physical Exam: right shoulder with painful and restricted motion as noted at recent office visits  Vitals  Temp:  [97.8 F (36.6 C)] 97.8 F (36.6 C) (01/11 0608) Pulse Rate:  [67] 67 (01/11 0608) Resp:  [18] 18 (01/11 0608) BP: (148)/(54) 148/54 (01/11 0608) SpO2:  [100 %] 100 % (01/11 [id number]) Weight:  [52.2 kg (115 lb)] 52.2 kg (115 lb) (01/11 [id number])  Assessment/Plan  Impression: Right shoulder rotator cuff tear arthropathy  Plan of Action: Procedure(s): REVERSE SHOULDER ARTHROPLASTY  Zahria Ding M Mes[place] Janus 06/28/2016, 7:26 AM Contact # [phone]

## 2016-06-28 NOTE — Anesthesia Procedure Notes (Signed)
Anesthesia Regional Block:  Interscalene brachial plexus block  Pre-Anesthetic Checklist: ,, timeout performed, Correct Patient, Correct Site, Correct Laterality, Correct Procedure, Correct Position, site marked, Risks and benefits discussed,  Surgical consent,  Pre-op evaluation,  At surgeon's request and post-op pain management  Laterality: Right  Prep: chloraprep       Needles:  Injection technique: Single-shot  Needle Type: Echogenic Stimulator Needle     Needle Length: 9cm 9 cm Needle Gauge: 21 and 21 G    Additional Needles:  Procedures: ultrasound guided (picture in chart) and nerve stimulator Interscalene brachial plexus block  Nerve Stimulator or Paresthesia:  Response: deltoid, 0.5 mA,   Additional Responses:   Narrative:  Start time: 06/28/2016 7:02 AM End time: 06/28/2016 7:10 AM Injection made incrementally with aspirations every 5 mL.  Performed by: Personally  Anesthesiologist: Suzette Battiest

## 2016-06-29 ENCOUNTER — Encounter (HOSPITAL_COMMUNITY): Payer: Self-pay | Admitting: Orthopedic Surgery

## 2016-06-29 MED ORDER — OXYCODONE-ACETAMINOPHEN 5-325 MG PO TABS: 1.0000 | ORAL_TABLET | ORAL | 0 refills | Status: DC | PRN

## 2016-06-29 MED ORDER — ONDANSETRON HCL 4 MG PO TABS: 4.0000 mg | ORAL_TABLET | Freq: Three times a day (TID) | ORAL | 0 refills | Status: DC | PRN

## 2016-06-29 MED ORDER — DIAZEPAM 5 MG PO TABS: 2.5000 mg | ORAL_TABLET | Freq: Four times a day (QID) | ORAL | 1 refills | Status: DC | PRN

## 2016-06-29 NOTE — Discharge Summary (Signed)
PATIENT ID:      Karen Gomez  MRN:     [id number] DOB/AGE:    1946-03-04 / 71 y.o.     DISCHARGE SUMMARY  ADMISSION DATE:    06/28/2016 DISCHARGE DATE:    ADMISSION DIAGNOSIS: Right shoulder rotator cuff tear arthropathy Past Medical History:  Diagnosis Date  . Arthritis   . Cataracts, bilateral   . Complication of anesthesia    WHEN SHE HAD NECK SURGERY ?2002, HAD SOME N/V, NONE WITH HER OTHER SURGERIES  . GERD (gastroesophageal reflux disease)   . Hypertension   . Nerve pain    CYST IN LOWER LUMBAR SPINE  . PONV (postoperative nausea and vomiting)   . RBBB     DISCHARGE DIAGNOSIS:   Active Problems:   S/P reverse total shoulder arthroplasty, right   PROCEDURE: Procedure(s): REVERSE SHOULDER ARTHROPLASTY on 06/28/2016  CONSULTS:    HISTORY:  See H&P in chart.  HOSPITAL COURSE:  Karen Gomez is a 71 y.o. admitted on 06/28/2016 with a diagnosis of Right shoulder rotator cuff tear arthropathy.  They were brought to the operating room on 06/28/2016 and underwent Procedure(s): [place].    They were given perioperative antibiotics: Anti-infectives    Start     Dose/Rate Route Frequency Ordered Stop   06/28/16 1400  clindamycin (CLEOCIN) IVPB 600 mg     600 mg 100 mL/hr over 30 Minutes Intravenous Every 6 hours 06/28/16 1120 06/29/16 0126   06/28/16 0715  clindamycin (CLEOCIN) IVPB 900 mg     900 mg 100 mL/hr over 30 Minutes Intravenous To Short Stay 06/27/16 0926 06/28/16 0743    .  Patient underwent the above named procedure and tolerated it well. The following day they were hemodynamically stable and pain was controlled on oral analgesics. They were neurovascularly intact to the operative extremity grossly as block was weraing off but motor in radial nerve was a trace sluggish. OT was ordered and worked with patient per protocol. They were medically and orthopaedically stable for discharge on day 1 .    DIAGNOSTIC STUDIES:  RECENT RADIOGRAPHIC  STUDIES :  No results found.  RECENT VITAL SIGNS:  Patient Vitals for the past 24 hrs:  BP Temp Temp src Pulse Resp SpO2  06/29/16 0420 (!) 109/53 98.5 F (36.9 C) Oral 83 18 97 %  06/28/16 2343 (!) 119/56 98.9 F (37.2 C) Oral 86 18 95 %  06/28/16 2023 126/65 98.6 F (37 C) Oral 84 18 96 %  06/28/16 1708 120/78 97 F (36.1 C) - - - 94 %  06/28/16 1119 126/64 97.8 F (36.6 C) - - - -  06/28/16 1003 - 97.7 F (36.5 C) - - - -  06/28/16 1001 113/66 - - 84 12 100 %  .  RECENT EKG RESULTS:    Orders placed or performed in visit on 05/17/16  . EKG 12-Lead    DISCHARGE INSTRUCTIONS:    DISCHARGE MEDICATIONS:   Allergies as of 06/29/2016      Reactions   Chocolate Swelling   Penicillins Anaphylaxis      Medication List    STOP taking these medications   HYDROcodone-acetaminophen 5-325 MG tablet Commonly known as:  NORCO/VICODIN     TAKE these medications   CALCIUM + D PO Take 1 tablet by mouth 2 (two) times daily.   cholecalciferol 1000 units tablet Commonly known as:  VITAMIN D Take 1,000 Units by mouth daily.   diazepam 5 MG tablet Commonly known  as:  VALIUM Take 0.5-1 tablets (2.5-5 mg total) by mouth every 6 (six) hours as needed for muscle spasms or sedation.   estradiol 0.5 MG tablet Commonly known as:  ESTRACE Take 0.5 mg by mouth daily.   gabapentin 300 MG capsule Commonly known as:  NEURONTIN Take 300 mg by mouth at bedtime.   lisinopril 10 MG tablet Commonly known as:  PRINIVIL,ZESTRIL Take 10 mg by mouth daily.   multivitamin tablet Take 1 tablet by mouth daily.   ondansetron 4 MG tablet Commonly known as:  ZOFRAN Take 1 tablet (4 mg total) by mouth every 8 (eight) hours as needed for nausea or vomiting.   oxyCODONE-acetaminophen 5-325 MG tablet Commonly known as:  PERCOCET Take 1-2 tablets by mouth every 4 (four) hours as needed.   progesterone 100 MG capsule Commonly known as:  PROMETRIUM Take 100 mg by mouth at bedtime.    ranitidine 300 MG tablet Commonly known as:  ZANTAC Take 300 mg by mouth daily as needed for heartburn.   TUMS ULTRA 1000 400 MG chewable tablet Generic drug:  calcium elemental as carbonate Chew 1,000 mg by mouth daily as needed for heartburn.       FOLLOW UP VISIT:   Follow-up Information    Metta Clines SUPPLE, MD.   Specialty:  Orthopedic Surgery Why:  call to be seen in 10-14 days Contact information: [address] [place] [address] [id number]           DISCHARGE TO: Home  DISPOSITION: Good  DISCHARGE CONDITION:  Festus Barren for Dr. Justice Britain 06/29/2016, 8:26 AM

## 2016-06-29 NOTE — Care Management Note (Signed)
Case Management Note  Patient Details  Name: Karen Gomez MRN: [id number] Date of Birth: [birth year]  Subjective/Objective:      71 yr old female s/p right reverse shoulder arthroplasty.              Action/Plan: Case manager spoke with patient concerning Home Health needs. Choice was offered for [place], referral was called to [place], [facility] liaison. Patient will have family support at discharge.     Expected Discharge Date:  06/29/16               Expected Discharge Plan:  [place]  In-House Referral:  NA  Discharge planning Services  CM Consult  Post Acute Care Choice:  Home Health Choice offered to:  Patient  DME Arranged:  N/A DME Agency:     HH Arranged:  OT, PT [place] Agency:  [facility]  Status of Service:  Completed, signed off  If discussed at [place] of Stay Meetings, dates discussed:    Additional Comments:  Jerelean, [place], RN 06/29/2016, 10:03 AM

## 2016-06-29 NOTE — Progress Notes (Signed)
Physical Therapy Treatment and Discharge Patient Details Name: Karen Gomez MRN: [id number] DOB: Mar 22, 1946 Today's Date: 06/29/2016    History of Present Illness The patient is a 71 y.o. female with end stage right shoulder rotator cuff tear arthropathy and HTN. Underwent R reverse TSA 06/28/16.    PT Comments    Pt progressing well towards physical therapy goals. Pt was received ambulating in hall without staff. She is grossly functioning at a modified independent level from a PT standpoint, and was able to complete stair training. PT will sign off at this time. If needs change, please reconsult.   Follow Up Recommendations  Home health PT;Other (comment) (home safety evaluation)     Equipment Recommendations  None recommended by PT    Recommendations for Other Services       Precautions / Restrictions Precautions Precautions: Shoulder Type of Shoulder Precautions: Wear sling at all times except during ADL's and exercise.  Pt can come out of sling when in a supported position for brief periods.  Exercise of shoulder to active protocol overseen by OT's Shoulder Interventions: Shoulder sling/immobilizer;Off for dressing/bathing/exercises Precaution Booklet Issued: Yes (comment) Required Braces or Orthoses: Sling Restrictions Weight Bearing Restrictions: Yes RUE Weight Bearing: Non weight bearing Other Position/Activity Restrictions: NWB    Mobility  Bed Mobility Overal bed mobility: Modified Independent Bed Mobility: Sit to Supine           General bed mobility comments: Pt able to transition back into bed and get settled with supervision. Mild assist for getting pillows positioned.  Transfers Overall transfer level: Modified independent Equipment used: None Transfers: Sit to/from Stand Sit to Stand: Supervision         General transfer comment: No assist required. No unsteadiness noted.   Ambulation/Gait Ambulation/Gait assistance: Modified independent  (Device/Increase time) Ambulation Distance (Feet): 400 Feet Assistive device: None Gait Pattern/deviations: Step-through pattern Gait velocity: Decreased Gait velocity interpretation: Below normal speed for age/gender General Gait Details: steady with slower cadence   Stairs Stairs: Yes   Stair Management: One rail Left;Forwards;Alternating pattern Number of Stairs: 10    Wheelchair Mobility    Modified Rankin (Stroke Patients Only)       Balance Overall balance assessment: No apparent balance deficits (not formally assessed)                                  Cognition Arousal/Alertness: Awake/alert Behavior During Therapy: WFL for tasks assessed/performed Overall Cognitive Status: Within Functional Limits for tasks assessed                      Exercises Shoulder Exercises Pendulum Exercise:  (instructed to "dangle") Shoulder Flexion: PROM;AROM;Supine;Right;10 reps (0-30) Shoulder ABduction: AROM;PROM;10 reps;Supine (0-30) Shoulder External Rotation: PROM;AROM;5 reps;Supine (@ 20 degrees from neutral) Elbow Flexion: AAROM;Right;10 reps;Supine Elbow Extension: AAROM;Right;10 reps Wrist Flexion: AROM;Right;10 reps Wrist Extension: AROM;Right;10 reps Digit Composite Flexion: AROM;Right;10 reps Composite Extension: 10 reps;PROM;AAROM (unable to complete composite extension) Donning/doffing shirt without moving shoulder: Caregiver independent with task Method for sponge bathing under operated UE: Supervision/safety Donning/doffing sling/immobilizer: Supervision/safety;Caregiver independent with task Correct positioning of sling/immobilizer: Caregiver independent with task;Patient able to independently direct caregiver ROM for elbow, wrist and digits of operated UE: Supervision/safety;Caregiver independent with task Sling wearing schedule (on at all times/off for ADL's): Modified independent Proper positioning of operated UE when showering: Modified  independent Positioning of UE while sleeping: Modified independent    General  Comments        Pertinent Vitals/Pain Pain Assessment: 0-10 Pain Score: 5  Pain Location: R shoulder Pain Descriptors / Indicators: Aching;Constant;Heaviness Pain Intervention(s): Limited activity within patient's tolerance    Home Living Family/patient expects to be discharged to:: Private residence Living Arrangements: Alone Available Help at Discharge: Family;Available PRN/intermittently;Other (Comment) Type of Home: House Home Access: Stairs to enter   Home Layout: Two level;Bed/bath upstairs Home Equipment: Shower seat      Prior Function Level of Independence: Independent          PT Goals (current goals can now be found in the care plan section) Acute Rehab PT Goals Patient Stated Goal: Independent/ functional use of her right arm  "I want to be able to put curlers in my hair with the right arm. PT Goal Formulation: With patient Time For Goal Achievement: 07/05/16 Potential to Achieve Goals: Good    Frequency    Min 3X/week      PT Plan Current plan remains appropriate    Co-evaluation             End of Session Equipment Utilized During Treatment: Gait belt Activity Tolerance: Patient tolerated treatment well Patient left: in bed;with call bell/phone within reach;with family/visitor present     Time: 0800-0815 PT Time Calculation (min) (ACUTE ONLY): 15 min  Charges:  $Gait Training: 8-22 mins                    G Codes:      Thelma Comp June 30, 2016, 1:12 PM   Rolinda Roan, PT, DPT Acute Rehabilitation Services Pager: [phone]

## 2016-06-29 NOTE — Op Note (Signed)
NAMEMarland Kitchen  ESSA, AREHART NO.:  [id number]  MEDICAL RECORD NO.:  [id number]  LOCATION:  3C08C                        FACILITY:  [place] [facility]  PHYSICIAN:  Metta Clines. Tevan Marian, M.D.  DATE OF BIRTH:  Sep 19, 1945  DATE OF PROCEDURE:  06/28/2016 DATE OF DISCHARGE:                              OPERATIVE REPORT   PREOPERATIVE DIAGNOSIS:  End-stage right shoulder rotator cuff tear arthropathy.  POSTOPERATIVE DIAGNOSIS:  End-stage right shoulder rotator cuff tear arthropathy.  PROCEDURE:  Right reverse shoulder arthroplasty utilizing a press-fit size 6 Arthrex stem with a +6 polyethylene insert and a 36+ 4 glenosphere on a small baseplate.  SURGEON:  Metta Clines. Keylan Costabile, M.D.  Terrence DupontOlivia Mackie A. Shuford, P.A.-C.  ANESTHESIA:  General endotracheal as well as an interscalene block.  ESTIMATED BLOOD LOSS:  150 mL.  DRAINS:  None.  HISTORY:  Karen Gomez is a 71 year old female, who has had chronic and progressive increasing right shoulder pain related to end-stage rotator cuff tear arthropathy.  Her clinical exam shows a painful restricted shoulder mobility with significant loss of function.  She was brought to the operating room at this time for planned right shoulder reverse arthroplasty.  Preoperatively, I counseled Karen Gomez regarding treatment options and potential risks versus benefits thereof.  Possible surgical complications were reviewed including bleeding, infection, neurovascular injury, persistent pain, loss of motion, anesthetic complication, failure of the implant and possible need for additional surgery.  She understands and accepts and agrees with our planned procedure.  PROCEDURE IN DETAIL:  After undergoing routine preop evaluation, the patient received prophylactic antibiotics and an interscalene block was established in the holding area by the Anesthesia Department.  Placed supine on the operating table, underwent smooth induction of a general endotracheal  anesthesia.  Placed in the beach-chair position and appropriately padded and protected.  The right shoulder girdle region was sterilely prepped and draped in standard fashion.  Time-out was called.  An anterior deltopectoral approach to the right shoulder was made through an 8-cm incision.  Skin flaps were elevated and dissection carried deeply.  Deltopectoral interval was identified with the vein taken laterally.  Upper centimeter of the pec major tenotomized to enhance exposure.  Retractors were placed.  The conjoined tendon was identified, mobilized, and retracted medially.  The rotator cuff was completely deficient superiorly and the remnant of the subscapularis was divided with electrocautery and tagged for later repair with #2 FiberWires.  We then divided the anterior-inferior capsular attachments and divided the capsule from the humeral neck allowing complete delivery of the head through the wound.  I then performed a humeral head resection following the outline from our extramedullary guide at approximately 20 degrees of retroversion.  I then placed a metal cap over the cut surface of the proximal humerus and at this point, we then exposed our glenoid with Doreatha Martin, pitchfork and snake tongue retractors. I performed a circumferential labral resection gaining complete visualization of the periphery of the glenoid, placed a central guidepin.  We then used the +2 reamer followed by the small baseplate reamer, obtaining good bony purchase and fixation and exposure.  We then placed our small glenoid baseplate, this was impacted.  Our central lag screw was placed followed by the superior and inferior locking screws, all of which had excellent bony purchase and fixation.  We then inserted the 36+ 4 glenosphere onto the baseplate.  This was impacted and had good stability.  At this point, we then returned our attention back to the proximal humerus where we performed hand reaming and then  broached up to size 6 with excellent fit and fixation.  We used the +2 posterior offset, reamed the metaphysis and then placed a trial implant and found good soft tissue balance with this construct.  We then on the back table, assembled the final +6 stem with the posterior offset metaphysis and once this was assembled, then we performed final press-fit, insertion of our humeral stem with excellent fit and fixation.  We then performed a series of trial reductions and felt that the +6 polyethylene insert gave [place] the best soft tissue balance.  The final +6 poly was then impacted into the humeral stem.  Final reduction was then performed and we were very pleased with the overall stability, motion and soft tissue balance.  The wound was then copiously irrigated.  Hemostasis was obtained.  We repaired the subscapularis back to the anterior humeral neck through the islets on our humeral stem, and this then allowed the easily 30 degrees of external rotation with the arm at the side. Shoulder was taken through motion, showed good motion and good stability.  At this point, the wound was again irrigated and hemostasis was obtained.  The deltopectoral interval was then reapproximated with a series of figure-of-eight #1 Vicryl sutures.  2-0 Monocryl was used for the subcu layer, intracuticular 3-0 Monocryl for the skin followed by Dermabond and Aquacel dressing.  The right arm was then placed into a sling.  The patient was awakened, extubated and taken to the recovery room in stable condition.  Jenetta Loges, PA-C, was used as an Environmental consultant throughout this case, was essential for help with positioning the patient, positioning the extremity, management of the tissue retractors, implantation of prosthesis, wound closure, and intraoperative decision making.     Metta Clines. Cartrell Bentsen, M.D.     KMS/MEDQ  D:  06/28/2016  T:  06/29/2016  Job:  [id number]

## 2016-06-29 NOTE — Progress Notes (Signed)
Occupational Therapy Evaluation and Treatment Patient Details Name: Karen Gomez MRN: [id number] DOB: 1945/12/13 Today's Date: 06/29/2016    History of Present Illness The patient is a 71 y.o. female with end stage right shoulder rotator cuff tear arthropathy and HTN. Underwent R reverse TSA 06/28/16.   Clinical Impression   PTA, pt lived alone and was independent with ADL and mobility. Pt seen x 2 today to complete education regarding management of RUE, compensatory techniques for ADL and establishing HEP per protocol ordered. Family present for education and written information reviewed. During second visit, pt reporting "more numbness in R hand and less movement". Pt with 4/5 wrist ext. 1/5 digit ext.0/5 thumb ext. Complains of sensory deficit more consistent with radial n distribution. Nsg notified of pt's concerns. Given pt's concern of hand feeling "worse", recommend contacting PA and observing/reassessing after lunch. Recommend follow up with [place] as pt lives alone (family available short term) and will benefit form HHOT to facilitate safe recovery and maximizing independence in the home.    Follow Up Recommendations  Home health OT;Supervision - Intermittent (i)    Equipment Recommendations  None recommended by OT    Recommendations for Other Services       Precautions / Restrictions Precautions Precautions: Shoulder Type of Shoulder Precautions: Wear sling at all times except during ADL's and exercise.  Pt can come out of sling when in a supported position for brief periods.  Exercise of shoulder to active protocol overseen by OT's Shoulder Interventions: Shoulder sling/immobilizer;Off for dressing/bathing/exercises Precaution Booklet Issued: Yes (comment) Required Braces or Orthoses: Sling Restrictions Weight Bearing Restrictions: Yes RUE Weight Bearing: Non weight bearing      Mobility Bed Mobility Overal bed mobility: Modified Independent                 Transfers Overall transfer level: Needs assistance   Transfers: Sit to/from Stand Sit to Stand: Supervision              Balance                                            ADL Overall ADL's : Needs assistance/impaired Eating/Feeding: Set up   Grooming: Minimal assistance   Upper Body Bathing: Sitting;Minimal assistance   Lower Body Bathing: Minimal assistance;Sit to/from stand   Upper Body Dressing : Moderate assistance   Lower Body Dressing: Minimal assistance;Sit to/from stand   Toilet Transfer: Supervision/safety   Toileting- Water quality scientist and Hygiene: Supervision/safety       Functional mobility during ADLs: Supervision/safety General ADL Comments: Pt educated on compensatory techniques for ADL. REviewed techniques during 2nd visit with daughter in law. Writtent information reviewed and given to pt. Completed ADL with pt/caregiver during second visit.., including donning/doffing sling and positioning of RUE and sling management/weraing time. Pt/daughter in law demonstrated understnading.     Vision     Perception     Praxis      Pertinent Vitals/Pain Pain Assessment: 0-10 Pain Score: 5  Pain Location: R shoulder Pain Descriptors / Indicators: Aching;Constant;Heaviness Pain Intervention(s): Limited activity within patient's tolerance;Repositioned;Ice applied     Hand Dominance Right   Extremity/Trunk Assessment Upper Extremity Assessment Upper Extremity Assessment: RUE deficits/detail RUE Deficits / Details: Pt with sluggish movement in Radial n distribution. unable tofully extend fingers or extend thumb. complains of sensory deficits more consistent with radial n distribution.  4/5 wrist ext. On second visit, pt states R hand feels "more numb" and that she doesn't feelshe canmove it as well. Pt wih increased complaints of pain during second visit.  RUE Sensation: decreased light touch (more in radial n distribution) RUE  Coordination: decreased fine motor;decreased gross motor   Lower Extremity Assessment Lower Extremity Assessment: Overall WFL for tasks assessed   Cervical / Trunk Assessment Cervical / Trunk Assessment: Normal   Communication Communication Communication: No difficulties   Cognition Arousal/Alertness: Awake/alert Behavior During Therapy: WFL for tasks assessed/performed Overall Cognitive Status: Within Functional Limits for tasks assessed                     General Comments       Exercises Exercises: Shoulder     Shoulder Instructions Shoulder Instructions Donning/doffing shirt without moving shoulder: Caregiver independent with task Method for sponge bathing under operated UE: Supervision/safety Donning/doffing sling/immobilizer: Supervision/safety;Caregiver independent with task Correct positioning of sling/immobilizer: Caregiver independent with task;Patient able to independently direct caregiver ROM for elbow, wrist and digits of operated UE: Supervision/safety;Caregiver independent with task Sling wearing schedule (on at all times/off for ADL's): Modified independent Proper positioning of operated UE when showering: Modified independent Positioning of UE while sleeping: Modified independent    Home Living Family/patient expects to be discharged to:: Private residence Living Arrangements: Alone Available Help at Discharge: Family;Available PRN/intermittently;Other (Comment) Type of Home: House Home Access: Stairs to enter     Home Layout: Two level;Bed/bath upstairs Alternate Level Stairs-Number of Steps: flights Alternate Level Stairs-Rails: Right;Left Bathroom Shower/Tub: Occupational psychologist: Handicapped height Bathroom Accessibility: Yes   Home Equipment: Shower seat          Prior Functioning/Environment Level of Independence: Independent                 OT Problem List: Decreased strength;Decreased range of motion;Decreased  coordination;Decreased knowledge of precautions;Impaired sensation;Impaired UE functional use;Pain   OT Treatment/Interventions: Self-care/ADL training;Therapeutic exercise;DME and/or AE instruction;Therapeutic activities;Patient/family education    OT Goals(Current goals can be found in the care plan section) Acute Rehab OT Goals Patient Stated Goal: Independent/ functional use of her right arm  "I want to be able to put curlers in my hair with the right arm. OT Goal Formulation: With patient  OT Frequency: Min 2X/week   Barriers to D/C:            Co-evaluation              End of Session Nurse Communication: Precautions;Patient requests pain meds;Other (comment) (status of pts concern of RUE)  Activity Tolerance: Patient tolerated treatment well Patient left: in bed;with call bell/phone within reach;with family/visitor present   Time: 0902-0928 OT Time Calculation (min): 26 min  @nd  visit: 1111-1140 OT time Calculation (min): 29 min Charges:  OT General Charges $OT Visit:  (2 visits) OT Evaluation $OT Eval Moderate Complexity: 1 Procedure OT Treatments $Self Care/Home Management : 8-22 mins $Therapeutic Exercise: 23-37 mins G-Codes:    Mahmoud Blazejewski,HILLARY 07-20-16, 12:19 PM

## 2016-06-29 NOTE — Progress Notes (Signed)
Patient alert and oriented, mae's well, voiding adequate amount of urine, swallowing without difficulty, c/o discomfort and ice pack applied. Patient discharged home with family. Script and discharged instructions given to patient. Patient and family stated understanding of d/c instructions given and has an appointment with MD.

## 2017-06-07 ENCOUNTER — Emergency Department (HOSPITAL_COMMUNITY): Payer: Medicare PPO

## 2017-06-07 ENCOUNTER — Encounter (HOSPITAL_COMMUNITY): Payer: Self-pay | Admitting: *Deleted

## 2017-06-07 ENCOUNTER — Other Ambulatory Visit: Payer: Self-pay

## 2017-06-07 ENCOUNTER — Observation Stay (HOSPITAL_COMMUNITY)
Admission: EM | Admit: 2017-06-07 | Discharge: 2017-06-08 | Disposition: A | Discharge status: Home / Self Care | Payer: Medicare PPO | Attending: Internal Medicine | Admitting: Internal Medicine

## 2017-06-07 DIAGNOSIS — Z79899 Other long term (current) drug therapy: Secondary | ICD-10-CM | POA: Insufficient documentation

## 2017-06-07 DIAGNOSIS — Z880 Allergy status to penicillin: Secondary | ICD-10-CM | POA: Diagnosis not present

## 2017-06-07 DIAGNOSIS — M1990 Unspecified osteoarthritis, unspecified site: Secondary | ICD-10-CM | POA: Diagnosis not present

## 2017-06-07 DIAGNOSIS — K219 Gastro-esophageal reflux disease without esophagitis: Secondary | ICD-10-CM | POA: Diagnosis not present

## 2017-06-07 DIAGNOSIS — G8929 Other chronic pain: Secondary | ICD-10-CM | POA: Diagnosis not present

## 2017-06-07 DIAGNOSIS — M549 Dorsalgia, unspecified: Secondary | ICD-10-CM | POA: Diagnosis not present

## 2017-06-07 DIAGNOSIS — Z96611 Presence of right artificial shoulder joint: Secondary | ICD-10-CM | POA: Insufficient documentation

## 2017-06-07 DIAGNOSIS — R002 Palpitations: Secondary | ICD-10-CM | POA: Insufficient documentation

## 2017-06-07 DIAGNOSIS — Z91018 Allergy to other foods: Secondary | ICD-10-CM | POA: Diagnosis not present

## 2017-06-07 DIAGNOSIS — I444 Left anterior fascicular block: Secondary | ICD-10-CM | POA: Insufficient documentation

## 2017-06-07 DIAGNOSIS — Z8249 Family history of ischemic heart disease and other diseases of the circulatory system: Secondary | ICD-10-CM | POA: Diagnosis not present

## 2017-06-07 DIAGNOSIS — I10 Essential (primary) hypertension: Secondary | ICD-10-CM | POA: Diagnosis not present

## 2017-06-07 DIAGNOSIS — R55 Syncope and collapse: Principal | ICD-10-CM | POA: Diagnosis present

## 2017-06-07 DIAGNOSIS — I4510 Unspecified right bundle-branch block: Secondary | ICD-10-CM | POA: Diagnosis not present

## 2017-06-07 LAB — CBC
HCT: 40.3 % (ref 36.0–46.0)
HEMOGLOBIN: 13 g/dL (ref 12.0–15.0)
MCH: 33 pg (ref 26.0–34.0)
MCHC: 32.3 g/dL (ref 30.0–36.0)
MCV: 102.3 fL — ABNORMAL HIGH (ref 78.0–100.0)
PLATELETS: 237 10*3/uL (ref 150–400)
RBC: 3.94 MIL/uL (ref 3.87–5.11)
RDW: 11.8 % (ref 11.5–15.5)
WBC: 7.4 10*3/uL (ref 4.0–10.5)

## 2017-06-07 LAB — BASIC METABOLIC PANEL
ANION GAP: 9 (ref 5–15)
BUN: 10 mg/dL (ref 6–20)
CALCIUM: 9.1 mg/dL (ref 8.9–10.3)
CO2: 24 mmol/L (ref 22–32)
CREATININE: 0.62 mg/dL (ref 0.44–1.00)
Chloride: 104 mmol/L (ref 101–111)
GFR calc Af Amer: >60 mL/min (ref >60)
GLUCOSE: 96 mg/dL (ref 65–99)
Potassium: 4.1 mmol/L (ref 3.5–5.1)
Sodium: 137 mmol/L (ref 135–145)

## 2017-06-07 LAB — TROPONIN I: Troponin I: <0.03 ng/mL (ref <0.03)

## 2017-06-07 NOTE — ED Triage Notes (Signed)
The pt has had fainting episodes since Sunday.  She fainted again today  No pain

## 2017-06-08 ENCOUNTER — Emergency Department (HOSPITAL_COMMUNITY): Payer: Medicare PPO

## 2017-06-08 ENCOUNTER — Other Ambulatory Visit: Payer: Self-pay

## 2017-06-08 DIAGNOSIS — I10 Essential (primary) hypertension: Secondary | ICD-10-CM | POA: Diagnosis not present

## 2017-06-08 DIAGNOSIS — G8929 Other chronic pain: Secondary | ICD-10-CM | POA: Diagnosis not present

## 2017-06-08 DIAGNOSIS — Z79890 Hormone replacement therapy (postmenopausal): Secondary | ICD-10-CM

## 2017-06-08 DIAGNOSIS — M549 Dorsalgia, unspecified: Secondary | ICD-10-CM

## 2017-06-08 DIAGNOSIS — R55 Syncope and collapse: Secondary | ICD-10-CM | POA: Diagnosis not present

## 2017-06-08 LAB — URINALYSIS, ROUTINE W REFLEX MICROSCOPIC
Bilirubin Urine: NEGATIVE
Glucose, UA: NEGATIVE mg/dL
HGB URINE DIPSTICK: NEGATIVE
Ketones, ur: NEGATIVE mg/dL
Nitrite: NEGATIVE
PROTEIN: NEGATIVE mg/dL
Specific Gravity, Urine: 1.005 (ref 1.005–1.030)
pH: 7 (ref 5.0–8.0)

## 2017-06-08 LAB — D-DIMER, QUANTITATIVE (NOT AT ARMC): D DIMER QUANT: 0.32 ug{FEU}/mL (ref 0.00–0.50)

## 2017-06-08 LAB — TSH: TSH: 3.23 u[IU]/mL (ref 0.350–4.500)

## 2017-06-08 MED ORDER — ENOXAPARIN SODIUM 40 MG/0.4ML ~~LOC~~ SOLN: 40.0000 mg | Freq: Every day | SUBCUTANEOUS | Status: DC

## 2017-06-08 MED ORDER — GABAPENTIN 300 MG PO CAPS: 300.0000 mg | ORAL_CAPSULE | Freq: Every day | ORAL | Status: DC

## 2017-06-08 MED ORDER — ESTRADIOL 1 MG PO TABS
0.5000 mg | ORAL_TABLET | Freq: Every day | ORAL | Status: DC
  Filled 2017-06-08: qty 0.5

## 2017-06-08 MED ORDER — ONDANSETRON HCL 4 MG/2ML IJ SOLN: 4.0000 mg | Freq: Four times a day (QID) | INTRAMUSCULAR | Status: DC | PRN

## 2017-06-08 MED ORDER — ONDANSETRON HCL 4 MG PO TABS: 4.0000 mg | ORAL_TABLET | Freq: Four times a day (QID) | ORAL | Status: DC | PRN

## 2017-06-08 MED ORDER — ACETAMINOPHEN 325 MG PO TABS: 650.0000 mg | ORAL_TABLET | Freq: Four times a day (QID) | ORAL | Status: DC | PRN

## 2017-06-08 MED ORDER — ACETAMINOPHEN 650 MG RE SUPP: 650.0000 mg | Freq: Four times a day (QID) | RECTAL | Status: DC | PRN

## 2017-06-08 MED ORDER — PROGESTERONE MICRONIZED 100 MG PO CAPS: 100.0000 mg | ORAL_CAPSULE | Freq: Every day | ORAL | Status: DC

## 2017-06-08 MED ORDER — LISINOPRIL 10 MG PO TABS
10.0000 mg | ORAL_TABLET | Freq: Every day | ORAL | Status: DC
  Filled 2017-06-08: qty 1

## 2017-06-08 NOTE — ED Provider Notes (Signed)
[facility] EMERGENCY DEPARTMENT Provider Note   CSN: [id number] Arrival date & time: 06/07/17  2006     History   Chief Complaint Chief Complaint  Patient presents with  . Loss of Consciousness    HPI Karen Gomez is a 71 y.o. female.  Patient presents for evaluation of syncope.  Patient reports that she has passed out 4 times today.  She had an episode earlier in the week as well.  With each of these events, she suddenly feels like she is about to pass out and before she can even sit down or move she collapses.  No associated chest pain, shortness of breath, heart palpitations, although when she wakes up she reports that her heart is racing.  No seizure-like activity.  She has not identified anything that brings on these episodes.  She reports that she had to episodes of syncope 1 year ago, had an echocardiogram and wore a heart monitor for 2 weeks but there were no abnormal findings.      Past Medical History:  Diagnosis Date  . Arthritis   . Cataracts, bilateral   . Complication of anesthesia    WHEN SHE HAD NECK SURGERY ?2002, HAD SOME N/V, NONE WITH HER OTHER SURGERIES  . GERD (gastroesophageal reflux disease)   . Hypertension   . Nerve pain    CYST IN LOWER LUMBAR SPINE  . PONV (postoperative nausea and vomiting)   . RBBB     Patient Active Problem List   Diagnosis Date Noted  . S/P reverse total shoulder arthroplasty, right 06/28/2016    Past Surgical History:  Procedure Laterality Date  . CESAREAN SECTION     X 2  . DILATION AND CURETTAGE OF UTERUS     PART OF UTERINE POLYP REMOVAL  . NECK SURGERY     X 1 PLACEMENT OF TITANIUM PLATE  . REVERSE SHOULDER ARTHROPLASTY Right 06/28/2016   Procedure: REVERSE SHOULDER ARTHROPLASTY;  Surgeon: Justice Britain, MD;  Location: [facility];  Service: Orthopedics;  Laterality: Right;  . UTERINE POLYPS      OB History    No data available       Home Medications    Prior to Admission medications     Medication Sig Start Date End Date Taking? Authorizing Provider  Calcium Citrate-Vitamin D (CALCIUM + D PO) Take 1 tablet by mouth 2 (two) times daily.    [provider]  calcium elemental as carbonate (TUMS ULTRA 1000) 400 MG chewable tablet Chew 1,000 mg by mouth daily as needed for heartburn.    [provider]  cholecalciferol (VITAMIN D) 1000 units tablet Take 1,000 Units by mouth daily.    [provider]  diazepam (VALIUM) 5 MG tablet Take 0.5-1 tablets (2.5-5 mg total) by mouth every 6 (six) hours as needed for muscle spasms or sedation. 06/29/16   Shuford, Olivia Mackie, PA-C  estradiol (ESTRACE) 0.5 MG tablet Take 0.5 mg by mouth daily.    [provider]  gabapentin (NEURONTIN) 300 MG capsule Take 300 mg by mouth at bedtime.  03/10/16   [provider]  lisinopril (PRINIVIL,ZESTRIL) 10 MG tablet Take 10 mg by mouth daily.    [provider]  Multiple Vitamin (MULTIVITAMIN) tablet Take 1 tablet by mouth daily.    [provider]  ondansetron (ZOFRAN) 4 MG tablet Take 1 tablet (4 mg total) by mouth every 8 (eight) hours as needed for nausea or vomiting. 06/29/16   Shuford, Olivia Mackie, PA-C  oxyCODONE-acetaminophen (PERCOCET) 5-325 MG tablet Take 1-2 tablets by mouth every 4 (four) hours as needed. 06/29/16   Shuford, Olivia Mackie, PA-C  progesterone (PROMETRIUM) 100 MG capsule Take 100 mg by mouth at bedtime.     [provider]  ranitidine (ZANTAC) 300 MG tablet Take 300 mg by mouth daily as needed for heartburn.    [provider]    Family History Family History  Problem Relation Age of Onset  . Dementia Mother   . Heart disease Father   . Heart disease Brother     Social History Social History   Tobacco Use  . Smoking status: Never Smoker  . Smokeless tobacco: Never Used  Substance Use Topics  . Alcohol use: Yes    Alcohol/week: 8.4 oz    Types: 14 Glasses of wine per week  . Drug use: No     Allergies    Chocolate and Penicillins   Review of Systems Review of Systems  Neurological: Positive for syncope.  All other systems reviewed and are negative.    Physical Exam Updated Vital Signs BP 122/62   Pulse 71   Temp 97.9 F (36.6 C) (Oral)   Resp 20   Ht 5\' 3"  (1.6 m)   Wt 52.2 kg (115 lb)   SpO2 96%   BMI 20.37 kg/m   Physical Exam  Constitutional: She is oriented to person, place, and time. She appears well-developed and well-nourished. No distress.  HENT:  Head: Normocephalic and atraumatic.  Right Ear: Hearing normal.  Left Ear: Hearing normal.  Nose: Nose normal.  Mouth/Throat: Oropharynx is clear and moist and mucous membranes are normal.  Eyes: Conjunctivae and EOM are normal. Pupils are equal, round, and reactive to light.  Neck: Normal range of motion. Neck supple.  Cardiovascular: Regular rhythm, S1 normal and S2 normal. Exam reveals no gallop and no friction rub.  No murmur heard. Pulmonary/Chest: Effort normal and breath sounds normal. No respiratory distress. She exhibits no tenderness.  Abdominal: Soft. Normal appearance and bowel sounds are normal. There is no hepatosplenomegaly. There is no tenderness. There is no rebound, no guarding, no tenderness at McBurney's point and negative Murphy's sign. No hernia.  Musculoskeletal: Normal range of motion.  Neurological: She is alert and oriented to person, place, and time. She has normal strength. No cranial nerve deficit or sensory deficit. Coordination normal. GCS eye subscore is 4. GCS verbal subscore is 5. GCS motor subscore is 6.  Extraocular muscle movement: normal No visual field cut Pupils: equal and reactive both direct and consensual response is normal No nystagmus present    Sensory function is intact to light touch, pinprick Proprioception intact  Grip strength 5/5 symmetric in upper extremities No pronator drift Normal finger to nose bilaterally  Lower extremity strength 5/5 against  gravity Normal heel to shin bilaterally  Gait: normal   Skin: Skin is warm, dry and intact. No rash noted. No cyanosis.  Psychiatric: She has a normal mood and affect. Her speech is normal and behavior is normal. Thought content normal.  Nursing note and vitals reviewed.    ED Treatments / Results  Labs (all labs ordered are listed, but only abnormal results are displayed) Labs Reviewed  CBC - Abnormal; Notable for the following components:      Result Value   MCV 102.3 (*)    All other components within normal limits  URINALYSIS, ROUTINE W REFLEX MICROSCOPIC - Abnormal; Notable for the following components:   Color, Urine STRAW (*)  Leukocytes, UA SMALL (*)    Bacteria, UA RARE (*)    Squamous Epithelial / LPF 0-5 (*)    All other components within normal limits  BASIC METABOLIC PANEL  TROPONIN I    EKG  EKG Interpretation None       Radiology Dg Chest 2 View  Result Date: 06/07/2017 CLINICAL DATA:  Syncope EXAM: CHEST  2 VIEW COMPARISON:  Report 10/07/2001 FINDINGS: No focal pulmonary infiltrate or effusion. Cardiomediastinal silhouette within normal limits. No pneumothorax. Pectus deformity of the lower chest wall. Cervical spine hardware. Right shoulder replacement. IMPRESSION: No active cardiopulmonary disease. Electronically Signed   By: Donavan Foil M.D.   On: 06/07/2017 21:14   Ct Head Wo Contrast  Result Date: 06/08/2017 CLINICAL DATA:  Acute onset of syncope. Generalized weakness and dizziness. EXAM: CT HEAD WITHOUT CONTRAST TECHNIQUE: Contiguous axial images were obtained from the base of the skull through the vertex without intravenous contrast. COMPARISON:  None. FINDINGS: Brain: No evidence of acute infarction, hemorrhage, hydrocephalus, extra-axial collection or mass lesion/mass effect. Prominence of the ventricles and sulci reflects mild cortical volume loss. The brainstem and fourth ventricle are within normal limits. The basal ganglia are unremarkable  in appearance. The cerebral hemispheres demonstrate grossly normal gray-white differentiation. No mass effect or midline shift is seen. Vascular: No hyperdense vessel or unexpected calcification. Skull: There is no evidence of fracture; visualized osseous structures are unremarkable in appearance. Sinuses/Orbits: The orbits are within normal limits. The paranasal sinuses and mastoid air cells are well-aerated. Other: No significant soft tissue abnormalities are seen. IMPRESSION: 1. No acute intracranial pathology seen on CT. 2. Mild cortical volume loss noted. Electronically Signed   By: Garald Balding M.D.   On: 06/08/2017 00:47    Procedures Procedures (including critical care time)  Medications Ordered in ED Medications - No data to display   Initial Impression / Assessment and Plan / ED Course  I have reviewed the triage vital signs and the nursing notes.  Pertinent labs & imaging results that were available during my care of the patient were reviewed by me and considered in my medical decision making (see chart for details).     Patient presents with multiple syncopal episodes tonight with unclear etiology.  She had minimal symptoms prior to the episodes.  She does not have any neurologic findings on examination currently, is back to her normal baseline.  Between her episodes, she was reportedly normal at home as well.  This would be suspicious for possible cardiac syncope, will require observation for further evaluation.  Her workup in the ER has been negative.  Final Clinical Impressions(s) / ED Diagnoses   Final diagnoses:  Syncope, unspecified syncope type    ED Discharge Orders    None       Orpah Greek, MD 06/08/17 0201

## 2017-06-08 NOTE — H&P (Addendum)
History and Physical    Karen Gomez QQP:[id number] DOB: August 01, 1945 DOA: 06/07/2017  Referring MD/NP/PA: Dr. Joseph Berkshire PCP: Harlan Stains, MD  Patient coming from: Home  Chief Complaint: Fainting spells  I have personally briefly reviewed patient's old medical records in [place]   HPI: Karen Gomez is a 71 y.o. female with medical history significant of HTN, GERD, arthritis; who presents with complaints of fainting spells.    The first episode occurred approximately 6 days ago while standing waiting to see [facility].  She was waiting in line for approximately 20 minutes prior to syncopal episode.  Family were nearby a and noted that she just kind of slumped down and sustained no trauma to her head.  Patient was only unconscious for 5-10 seconds. They did not report any seizure-like activity, loss of bowel/bladder, or tongue biting.  When she woke she was not disoriented and there was no signs of stroke.   EMS was called, but patient refused transport and EKG showed signs of right bundle blanch block which was already known.  She saw her PCP the following day who recommended that she possibly go back to see Dr. Curt Bears cardiology if symptoms persisted.  However, patient had no subsequent episodes until yesterday.  She reports that she had been standing for multiple hours while baking cookies with her grandkid.  Notes that she had been otherwise eating and drinking like normal. Reports having 2 syncopal episodes while standing.  Then while sitting felt herself about to pass out, and when she tried to get up to go lay down on the couch had her third episode.  A fourth syncopal episode occurred while sitting in a chair. When coming to patient reports having palpitations.  She flew to [place] 2 weeks ago, but notes that the plane is harder approximately an hour or so.  Denies any recent change in medications, change in speech, facial droop, focal weakness, leg swelling, calf  pain nausea, vomiting, diarrhea, or chest pain.  Associated symptoms include reports of chronic issues with urinary frequency related to drinking lots of water, and back pain for which she needs a back surgery.  Similar symptoms occurred 1 year ago for which patient was worked up in the outpatient setting by [facility] of cardiology, and had negative echocardiogram with EF 60-65% on 05/24/2016 and negative 30-day Holter event monitor.    ED Course: Upon admission patient was noted to have vital signs relatively within normal limits.  Inital orthostatic vital signs were obtained and noted to be negative.  Labs were unremarkable including troponin except for elevated MCV of 102.3.  Chest x-ray and urinalysis were negative for any acute abnormalities.  CT scan of the brain showed no acute signs of stroke, but mild cortical loss.  Review of Systems  Constitutional: Negative for chills, fever, malaise/fatigue and weight loss.  HENT: Negative for ear discharge and ear pain.   Eyes: Negative for photophobia and pain.  Respiratory: Negative for cough and sputum production.   Cardiovascular: Positive for palpitations. Negative for chest pain.  Gastrointestinal: Negative for abdominal pain, diarrhea, nausea and vomiting.  Genitourinary: Positive for frequency (As this is chronic). Negative for dysuria.  Musculoskeletal: Positive for back pain (Chronic).  Skin: Negative for itching and rash.  Neurological: Positive for loss of consciousness and headaches. Negative for sensory change.  Psychiatric/Behavioral: Negative for substance abuse.    Past Medical History:  Diagnosis Date  . Arthritis   . Cataracts, bilateral   .  Complication of anesthesia    WHEN SHE HAD NECK SURGERY ?2002, HAD SOME N/V, NONE WITH HER OTHER SURGERIES  . GERD (gastroesophageal reflux disease)   . Hypertension   . Nerve pain    CYST IN LOWER LUMBAR SPINE  . PONV (postoperative nausea and vomiting)   . RBBB     Past Surgical  History:  Procedure Laterality Date  . CESAREAN SECTION     X 2  . DILATION AND CURETTAGE OF UTERUS     PART OF UTERINE POLYP REMOVAL  . NECK SURGERY     X 1 PLACEMENT OF TITANIUM PLATE  . REVERSE SHOULDER ARTHROPLASTY Right 06/28/2016   Procedure: REVERSE SHOULDER ARTHROPLASTY;  Surgeon: Justice Britain, MD;  Location: [facility];  Service: Orthopedics;  Laterality: Right;  . UTERINE POLYPS       reports that  has never smoked. she has never used smokeless tobacco. She reports that she drinks about 8.4 oz of alcohol per week. She reports that she does not use drugs.  Allergies  Allergen Reactions  . Chocolate Swelling  . Penicillins Anaphylaxis    Family History  Problem Relation Age of Onset  . Dementia Mother   . Heart disease Father   . Heart disease Brother     Prior to Admission medications   Medication Sig Start Date End Date Taking? Authorizing Provider  Calcium Citrate-Vitamin D (CALCIUM + D PO) Take 1 tablet by mouth 2 (two) times daily.    [provider]  calcium elemental as carbonate (TUMS ULTRA 1000) 400 MG chewable tablet Chew 1,000 mg by mouth daily as needed for heartburn.    [provider]  cholecalciferol (VITAMIN D) 1000 units tablet Take 1,000 Units by mouth daily.    [provider]  diazepam (VALIUM) 5 MG tablet Take 0.5-1 tablets (2.5-5 mg total) by mouth every 6 (six) hours as needed for muscle spasms or sedation. 06/29/16   Shuford, Olivia Mackie, PA-C  estradiol (ESTRACE) 0.5 MG tablet Take 0.5 mg by mouth daily.    [provider]  gabapentin (NEURONTIN) 300 MG capsule Take 300 mg by mouth at bedtime.  03/10/16   [provider]  lisinopril (PRINIVIL,ZESTRIL) 10 MG tablet Take 10 mg by mouth daily.    [provider]  Multiple Vitamin (MULTIVITAMIN) tablet Take 1 tablet by mouth daily.    [provider]  ondansetron (ZOFRAN) 4 MG tablet Take 1 tablet (4 mg total) by mouth every 8 (eight) hours as needed  for nausea or vomiting. 06/29/16   Shuford, Olivia Mackie, PA-C  oxyCODONE-acetaminophen (PERCOCET) 5-325 MG tablet Take 1-2 tablets by mouth every 4 (four) hours as needed. 06/29/16   Shuford, Olivia Mackie, PA-C  progesterone (PROMETRIUM) 100 MG capsule Take 100 mg by mouth at bedtime.     [provider]  ranitidine (ZANTAC) 300 MG tablet Take 300 mg by mouth daily as needed for heartburn.    [provider]    Physical Exam:  Constitutional: Elderly female who appears in NAD, calm, comfortable Vitals:   06/07/17 2330 06/07/17 2345 06/08/17 0000 06/08/17 0100  BP: 128/62 (!) 119/50 (!) 122/48 122/62  Pulse: 68 72 76 71  Resp: 13 13 18 20   Temp:      TempSrc:      SpO2: 98% 97% 97% 96%  Weight:      Height:       Eyes: PERRL, lids and conjunctivae normal ENMT: Mucous membranes are moist. Posterior pharynx clear of any exudate  or lesions.Normal dentition.  Neck: normal, supple, no masses, no thyromegaly Respiratory: clear to auscultation bilaterally, no wheezing, no crackles. Normal respiratory effort. No accessory muscle use.  Cardiovascular: Regular rate and rhythm, no murmurs / rubs / gallops. No extremity edema. 2+ pedal pulses. No carotid bruits.  Abdomen: no tenderness, no masses palpated. No hepatosplenomegaly. Bowel sounds positive.  Musculoskeletal: no clubbing / cyanosis. No joint deformity upper and lower extremities. Good ROM, no contractures. Normal muscle tone.  Skin: no rashes, lesions, ulcers. No induration Neurologic: CN 2-12 grossly intact. Sensation intact, DTR normal. Strength 5/5 in all 4.  Psychiatric: Normal judgment and insight. Alert and oriented x 3. Normal mood.     Labs on Admission: I have personally reviewed following labs and imaging studies  CBC: Recent Labs  Lab 06/07/17 2031  WBC 7.4  HGB 13.0  HCT 40.3  MCV 102.3*  PLT 643   Basic Metabolic Panel: Recent Labs  Lab 06/07/17 2031  NA 137  K 4.1  CL 104  CO2 24  GLUCOSE 96  BUN 10   CREATININE 0.62  CALCIUM 9.1   GFR: Estimated Creatinine Clearance: 52.4 mL/min (by C-G formula based on SCr of 0.62 mg/dL). Liver Function Tests: No results for input(s): AST, ALT, ALKPHOS, BILITOT, PROT, ALBUMIN in the last 168 hours. No results for input(s): LIPASE, AMYLASE in the last 168 hours. No results for input(s): AMMONIA in the last 168 hours. Coagulation Profile: No results for input(s): INR, PROTIME in the last 168 hours. Cardiac Enzymes: Recent Labs  Lab 06/07/17 2031  TROPONINI <0.03   BNP (last 3 results) No results for input(s): PROBNP in the last 8760 hours. HbA1C: No results for input(s): HGBA1C in the last 72 hours. CBG: No results for input(s): GLUCAP in the last 168 hours. Lipid Profile: No results for input(s): CHOL, HDL, LDLCALC, TRIG, CHOLHDL, LDLDIRECT in the last 72 hours. Thyroid Function Tests: No results for input(s): TSH, T4TOTAL, FREET4, T3FREE, THYROIDAB in the last 72 hours. Anemia Panel: No results for input(s): VITAMINB12, FOLATE, FERRITIN, TIBC, IRON, RETICCTPCT in the last 72 hours. Urine analysis:    Component Value Date/Time   COLORURINE STRAW (A) 06/08/2017 0104   APPEARANCEUR CLEAR 06/08/2017 0104   LABSPEC 1.005 06/08/2017 0104   PHURINE 7.0 06/08/2017 0104   GLUCOSEU NEGATIVE 06/08/2017 0104   HGBUR NEGATIVE 06/08/2017 0104   BILIRUBINUR NEGATIVE 06/08/2017 0104   KETONESUR NEGATIVE 06/08/2017 0104   PROTEINUR NEGATIVE 06/08/2017 0104   NITRITE NEGATIVE 06/08/2017 0104   LEUKOCYTESUR SMALL (A) 06/08/2017 0104   Sepsis Labs: No results found for this or any previous visit (from the past 240 hour(s)).   Radiological Exams on Admission: Dg Chest 2 View  Result Date: 06/07/2017 CLINICAL DATA:  Syncope EXAM: CHEST  2 VIEW COMPARISON:  Report 10/07/2001 FINDINGS: No focal pulmonary infiltrate or effusion. Cardiomediastinal silhouette within normal limits. No pneumothorax. Pectus deformity of the lower chest wall. Cervical  spine hardware. Right shoulder replacement. IMPRESSION: No active cardiopulmonary disease. Electronically Signed   By: Donavan Foil M.D.   On: 06/07/2017 21:14   Ct Head Wo Contrast  Result Date: 06/08/2017 CLINICAL DATA:  Acute onset of syncope. Generalized weakness and dizziness. EXAM: CT HEAD WITHOUT CONTRAST TECHNIQUE: Contiguous axial images were obtained from the base of the skull through the vertex without intravenous contrast. COMPARISON:  None. FINDINGS: Brain: No evidence of acute infarction, hemorrhage, hydrocephalus, extra-axial collection or mass lesion/mass effect. Prominence of the ventricles and sulci reflects mild cortical volume loss.  The brainstem and fourth ventricle are within normal limits. The basal ganglia are unremarkable in appearance. The cerebral hemispheres demonstrate grossly normal gray-white differentiation. No mass effect or midline shift is seen. Vascular: No hyperdense vessel or unexpected calcification. Skull: There is no evidence of fracture; visualized osseous structures are unremarkable in appearance. Sinuses/Orbits: The orbits are within normal limits. The paranasal sinuses and mastoid air cells are well-aerated. Other: No significant soft tissue abnormalities are seen. IMPRESSION: 1. No acute intracranial pathology seen on CT. 2. Mild cortical volume loss noted. Electronically Signed   By: Garald Balding M.D.   On: 06/08/2017 00:47    EKG: Independently reviewed.  Normal sinus rhythm with RBBB and signs suggestive of LAFB that was also seen during previous EKG from 11 2017.  Assessment/Plan Syncopal episodes: Acute.  Patient presents after having 5 syncopal episodes.  Most occurred after prolonged standing, but one in particular was noted while sitting.  Initial orthostatic vital signs noted to be negative. Suspected postural hypotension, but also on the differential is cardiac arrhythmia vs blood clot.  - Admit to a telemetry bed - Check d-dimer - Follow-up  telemetry overnight - Conservative measures could include decreasing or discontinuing BP medicin - Consider need to consult cardiology to give recommendations if needed in a.m.   RBBB: Chronic.  EKG has some findings to suggest left anterior fascicular block as well that also appear on previous EKG tracings from 2017.  Essential hypertension  - Continue lisinopril for now  Hormone replacement therapy: Counseled the patient on risks of hormone replacement therapy, but reports will of  continuation of this medication. - Continue progesterone and estradiol supplementation  Chronic back pain - Continue gabapentin, hydrocodone prn pain  DVT prophylaxis: Lovenox  Code Status: Full  Family Communication: Plan of care with the patient family present at bedside Disposition Plan: likely   Consults called: None Admission status: observation  Norval Morton MD [facility] Pager [phone]   If 7PM-7AM, please contact night-coverage [email or web address] Password [email or web address]  06/08/2017, 2:17 AM

## 2017-06-08 NOTE — Plan of Care (Signed)
Discharge instructions reviewed with patient and daughter in law, questions answered, verbalized understanding.  Patient transported to main entrance to be taken home by daughter in law.

## 2017-06-08 NOTE — ED Notes (Signed)
Recollect d-dimer and tsh.

## 2017-06-08 NOTE — ED Notes (Signed)
Dr. Tamala Julian at bedside, original EKG found in triage and given to MD

## 2017-06-08 NOTE — Discharge Summary (Signed)
Physician Discharge Summary  BLU MCGLAUN Karen Gomez:[id number] DOB: 1945-10-28 DOA: 06/07/2017  PCP: Harlan Stains, MD  Admit date: 06/07/2017 Discharge date: 06/08/2017  Time spent: 30 minutes  Recommendations for Outpatient Follow-up:  1. Repeat basic metabolic panel to follow electrolytes and renal function   Discharge Diagnoses:  Active Problems:   Syncopal episodes   Essential hypertension   Discharge Condition: Stable and improved.  Patient discharged home with instruction to follow-up with cardiology service for loop recorder implantation.  Diet recommendation: Heart healthy diet  Filed Weights   06/07/17 2023 06/08/17 0631  Weight: 52.2 kg (115 lb) 51.9 kg (114 lb 8 oz)    History of present illness:  As per H&P written by Dr. Tamala Julian on 06/08/17. 71 y.o. female with medical history significant of HTN, GERD, arthritis; who presents with complaints of fainting spells.The first episode occurred approximately 6 days ago while standing waiting to see [facility].  She was waiting in line for approximately 20 minutes prior to syncopal episode.  Family were nearby a and noted that she just kind of slumped down and sustained no trauma to her head.  Patient was only unconscious for 5-10 seconds. They did not report any seizure-like activity, loss of bowel/bladder, or tongue biting.  When she woke she was not disoriented and there was no signs of stroke.   EMS was called, but patient refused transport and EKG showed signs of right bundle blanch block which was already known.  She saw her PCP the following day who recommended that she possibly go back to see Dr. Curt Bears cardiology if symptoms persisted.  However, patient had no subsequent episodes until yesterday.  She reports that she had been standing for multiple hours while baking cookies with her grandkid.  Notes that she had been otherwise eating and drinking like normal. Reports having 2 syncopal episodes while standing.  Then while  sitting felt herself about to pass out, and when she tried to get up to go lay down on the couch had her third episode.  A fourth syncopal episode occurred while sitting in a chair. When coming to patient reports having palpitations.  She flew to [place] 2 weeks ago, but notes that the plane is harder approximately an hour or so.  Denies any recent change in medications, change in speech, facial droop, focal weakness, leg swelling, calf pain nausea, vomiting, diarrhea, or chest pain.  Hospital Course:  1-syncope: Undetermined etiology -With concern for abnormal cardiac conduction -Telemetry unremarkable, EKG demonstrating right bundle branch block (unchanged from prior tracing), negative troponin and normal blood work -Normal TSH, negative d-dimer and negative CT of the head. -Patient will be discharged home with close follow-up with cardiology service for loop recorder implantation. -Patient instructed to keep herself well-hydrated and to be safe/take her time when changing positions.  2-hypertension: -Overall well controlled -No orthostatic changes -Will resume the use of lisinopril -Patient advised to follow heart healthy diet.  3-chronic back pain -Continue Neurontin and as needed analgesics as per her home regimen.  Procedures:  See below for x-ray reports.  Consultations:  Cardiology curbside: (Recommendations given for outpatient follow-up for loop recorder).  Discharge Exam: Vitals:   06/08/17 0631 06/08/17 0939  BP: (!) 142/50   Pulse: 65   Resp: 16   Temp: (!) 97.4 F (36.3 C)   SpO2: 100% 98%    General: Afebrile, no chest pain, no shortness of breath, no nausea, no vomiting, no abdominal pain.  Patient denies palpitations, lightheadedness or  any other acute complaints. Cardiovascular: S1 and S2, no rubs, no gallops, no JVD, no murmurs. Respiratory: Good air movement bilaterally, no wheezing, no crackles Abdomen: Soft, nontender, nondistended, positive bowel  sounds Extremities: No edema, no cyanosis, no clubbing. Neurologic: Cranial nerves intact, alert, awake and oriented x3, no focal motor or neurologic deficit appreciated on exam.  Discharge Instructions   Discharge Instructions    Discharge instructions   Complete by:  As directed    Take medications as prescribed Keep yourself well-hydrated Increase activity gradually  Follow-up with cardiology (office will contact you with appointment details) Follow a heart healthy diet     Allergies as of 06/08/2017      Reactions   Chocolate Swelling   Penicillins Anaphylaxis      Medication List    TAKE these medications   CALCIUM + D PO Take 1 tablet by mouth every morning.   cholecalciferol 1000 units tablet Commonly known as:  VITAMIN D Take 1,000 Units by mouth daily.   escitalopram 5 MG tablet Commonly known as:  LEXAPRO Take 5 mg by mouth daily.   estradiol 0.5 MG tablet Commonly known as:  ESTRACE Take 0.5 mg by mouth daily.   gabapentin 300 MG capsule Commonly known as:  NEURONTIN Take 300 mg by mouth at bedtime.   HYDROcodone-acetaminophen 5-325 MG tablet Commonly known as:  NORCO/VICODIN Take 0.5 tablets by mouth every morning.   lisinopril 10 MG tablet Commonly known as:  PRINIVIL,ZESTRIL Take 10 mg by mouth daily.   multivitamin tablet Take 1 tablet by mouth daily.   ondansetron 4 MG tablet Commonly known as:  ZOFRAN Take 1 tablet (4 mg total) by mouth every 8 (eight) hours as needed for nausea or vomiting.   progesterone 100 MG capsule Commonly known as:  PROMETRIUM Take 100 mg by mouth at bedtime.   ranitidine 300 MG tablet Commonly known as:  ZANTAC Take 300 mg by mouth daily as needed for heartburn.   TUMS ULTRA 1000 400 MG chewable tablet Generic drug:  calcium elemental as carbonate Chew 1,000 mg by mouth daily as needed for heartburn.      Allergies  Allergen Reactions  . Chocolate Swelling  . Penicillins Anaphylaxis   Follow-up  Information    Constance Haw, MD Follow up today.   Specialty:  Cardiology Why:  office will contact you with appointment details. Contact information: [place] [address] [phone]           The results of significant diagnostics from this hospitalization (including imaging, microbiology, ancillary and laboratory) are listed below for reference.    Significant Diagnostic Studies: Dg Chest 2 View  Result Date: 06/07/2017 CLINICAL DATA:  Syncope EXAM: CHEST  2 VIEW COMPARISON:  Report 10/07/2001 FINDINGS: No focal pulmonary infiltrate or effusion. Cardiomediastinal silhouette within normal limits. No pneumothorax. Pectus deformity of the lower chest wall. Cervical spine hardware. Right shoulder replacement. IMPRESSION: No active cardiopulmonary disease. Electronically Signed   By: Donavan Foil M.D.   On: 06/07/2017 21:14   Ct Head Wo Contrast  Result Date: 06/08/2017 CLINICAL DATA:  Acute onset of syncope. Generalized weakness and dizziness. EXAM: CT HEAD WITHOUT CONTRAST TECHNIQUE: Contiguous axial images were obtained from the base of the skull through the vertex without intravenous contrast. COMPARISON:  None. FINDINGS: Brain: No evidence of acute infarction, hemorrhage, hydrocephalus, extra-axial collection or mass lesion/mass effect. Prominence of the ventricles and sulci reflects mild cortical volume loss. The brainstem and fourth  ventricle are within normal limits. The basal ganglia are unremarkable in appearance. The cerebral hemispheres demonstrate grossly normal gray-white differentiation. No mass effect or midline shift is seen. Vascular: No hyperdense vessel or unexpected calcification. Skull: There is no evidence of fracture; visualized osseous structures are unremarkable in appearance. Sinuses/Orbits: The orbits are within normal limits. The paranasal sinuses and mastoid air cells are well-aerated. Other: No significant soft tissue  abnormalities are seen. IMPRESSION: 1. No acute intracranial pathology seen on CT. 2. Mild cortical volume loss noted. Electronically Signed   By: Garald Balding M.D.   On: 06/08/2017 00:47   Labs: Basic Metabolic Panel: Recent Labs  Lab 06/07/17 2031  NA 137  K 4.1  CL 104  CO2 24  GLUCOSE 96  BUN 10  CREATININE 0.62  CALCIUM 9.1   CBC: Recent Labs  Lab 06/07/17 2031  WBC 7.4  HGB 13.0  HCT 40.3  MCV 102.3*  PLT 237   Cardiac Enzymes: Recent Labs  Lab 06/07/17 2031  TROPONINI <0.03    Signed:  Barton Dubois MD.  [facility] 06/08/2017, 11:07 AM

## 2017-06-13 ENCOUNTER — Telehealth: Payer: Self-pay | Admitting: Cardiology

## 2017-06-13 NOTE — Telephone Encounter (Signed)
New Message    Pt c/o Syncope: STAT if syncope occurred within 30 minutes and pt complains of lightheadedness High Priority if episode of passing out, completely, today or in last 24 hours   1. Did you pass out today? No  2. When is the last time you passed out? 06/07/2017  3. Has this occurred multiple times? yes  4. Did you have any symptoms prior to passing out? Felt cloudy,fuzzy and passed out immediately.

## 2017-06-13 NOTE — Telephone Encounter (Signed)
Pt reports doing fine currently. Last syncopal episode was 12/21 and she went to the hospital. Pt currently scheduled w/ Chanetta Marshall, NP for post hospital f/u. I rescheduled pt to Dr. Macky Lower schedule to discuss in further detail w/ physician. Pt thanks me for the help and agreeable to plan. Will forward to Dr. Curt Bears for his Juluis Rainier and if he has any advisement for pt before appt on 06/27/17.

## 2017-06-14 NOTE — Telephone Encounter (Signed)
Follow up    Patient calling to confirm if she is able to travel on airplane 06/28/17. She has already made arrangements.  Please call

## 2017-06-14 NOTE — Telephone Encounter (Signed)
Advised pt that Dr. Curt Bears did not have any changes to recommend but to keep appt on 06/27/17. Advised ok to fly and discussed precautionary measures (such as staying hydrated, changing positions slowly, don't sit for prolonged periods, etc). Pt is agreeable and states she is feeling great and no issues since hospital.

## 2017-06-14 NOTE — Telephone Encounter (Signed)
No changes at this time. Continue follow up in EP clinic.

## 2017-06-27 ENCOUNTER — Ambulatory Visit: Payer: Medicare PPO | Admitting: Nurse Practitioner

## 2017-06-27 ENCOUNTER — Ambulatory Visit: Payer: Medicare PPO | Admitting: Cardiology

## 2017-06-27 ENCOUNTER — Encounter: Payer: Self-pay | Admitting: Cardiology

## 2017-06-27 ENCOUNTER — Other Ambulatory Visit: Payer: Self-pay | Admitting: Cardiology

## 2017-06-27 ENCOUNTER — Encounter: Payer: Self-pay | Admitting: *Deleted

## 2017-06-27 VITALS — BP 130/66 | HR 56 | Ht 63.0 in | Wt 115.0 lb

## 2017-06-27 DIAGNOSIS — R55 Syncope and collapse: Secondary | ICD-10-CM | POA: Diagnosis not present

## 2017-06-27 NOTE — Progress Notes (Signed)
Electrophysiology Office Note   Date:  06/27/2017   ID:  Karen, Gomez [birth year], MRN [id number]  PCP:  Harlan Stains, MD Primary Electrophysiologist:  Gervis Gaba Meredith Leeds, MD    No chief complaint on file.    History of Present Illness: Karen Gomez is a 72 y.o. female who presents today for electrophysiology evaluation.   She has a history of hypertension.   She has had multiple episodes of syncope.  She was waiting in the line to see [facility] at this time.  She had been waiting in line for approximately 20 minutes prior to the episode.  Per family, she slumped down and sustained no trauma.  She was unconscious for 5-10 seconds.  No seizure-like activity, loss of bowel bladder function or tongue biting.  She was not disoriented when she awoke.  A few days later, she had multiple episodes of syncope.  The first occurred when she was standing for multiple hours while making with her grandchildren.  She had 2 other syncopal episodes while standing.  She substernally had one while sitting.  Prior to whole of her episodes, she had weakness, and dizziness.  She felt well after all of her episodes.  Today, denies symptoms of palpitations, chest pain, shortness of breath, orthopnea, PND, lower extremity edema, claudication, dizziness, presyncope, syncope, bleeding, or neurologic sequela. The patient is tolerating medications without difficulties.     Past Medical History:  Diagnosis Date  . Arthritis   . Cataracts, bilateral   . Complication of anesthesia    WHEN SHE HAD NECK SURGERY ?2002, HAD SOME N/V, NONE WITH HER OTHER SURGERIES  . GERD (gastroesophageal reflux disease)   . Hypertension   . Nerve pain    CYST IN LOWER LUMBAR SPINE  . PONV (postoperative nausea and vomiting)   . RBBB    Past Surgical History:  Procedure Laterality Date  . CESAREAN SECTION     X 2  . DILATION AND CURETTAGE OF UTERUS     PART OF UTERINE POLYP REMOVAL  . NECK SURGERY     X 1 PLACEMENT  OF TITANIUM PLATE  . REVERSE SHOULDER ARTHROPLASTY Right 06/28/2016   Procedure: REVERSE SHOULDER ARTHROPLASTY;  Surgeon: Justice Britain, MD;  Location: [place];  Service: Orthopedics;  Laterality: Right;  . UTERINE POLYPS       Current Outpatient Medications  Medication Sig Dispense Refill  . Calcium Citrate-Vitamin D (CALCIUM + D PO) Take 1 tablet by mouth every morning.     . calcium elemental as carbonate (TUMS ULTRA 1000) 400 MG chewable tablet Chew 1,000 mg by mouth daily as needed for heartburn.    . cholecalciferol (VITAMIN D) 1000 units tablet Take 1,000 Units by mouth daily.    Marland Kitchen escitalopram (LEXAPRO) 5 MG tablet Take 5 mg by mouth daily.    Marland Kitchen estradiol (ESTRACE) 0.5 MG tablet Take 0.5 mg by mouth daily.    Marland Kitchen gabapentin (NEURONTIN) 300 MG capsule Take 300 mg by mouth at bedtime.     Marland Kitchen HYDROcodone-acetaminophen (NORCO/VICODIN) 5-325 MG tablet Take 0.5 tablets by mouth every morning.    Marland Kitchen lisinopril (PRINIVIL,ZESTRIL) 10 MG tablet Take 10 mg by mouth daily.    . Multiple Vitamin (MULTIVITAMIN) tablet Take 1 tablet by mouth daily.    . ondansetron (ZOFRAN) 4 MG tablet Take 1 tablet (4 mg total) by mouth every 8 (eight) hours as needed for nausea or vomiting. 20 tablet 0  . progesterone (PROMETRIUM) 100 MG capsule Take  100 mg by mouth at bedtime.     . ranitidine (ZANTAC) 300 MG tablet Take 300 mg by mouth daily as needed for heartburn.     No current facility-administered medications for this visit.     Allergies:   Chocolate and Penicillins   Social History:  The patient  reports that  has never smoked. she has never used smokeless tobacco. She reports that she drinks about 8.4 oz of alcohol per week. She reports that she does not use drugs.   Family History:  The patient's family history includes Dementia in her mother; Heart disease in her brother and father.   ROS:  Please see the history of present illness.   Otherwise, review of systems is positive for dizziness, passing out.    All other systems are reviewed and negative.   PHYSICAL EXAM: VS:  There were no vitals taken for this visit. , BMI There is no height or weight on file to calculate BMI. GEN: Well nourished, well developed, in no acute distress  HEENT: normal  Neck: no JVD, carotid bruits, or masses Cardiac: RRR; no murmurs, rubs, or gallops,no edema  Respiratory:  clear to auscultation bilaterally, normal work of breathing GI: soft, nontender, nondistended, + BS MS: no deformity or atrophy  Skin: warm and dry Neuro:  Strength and sensation are intact Psych: euthymic mood, full affect  EKG:  EKG is not ordered today. Personal review of the ekg ordered 06/12/17 shows SR, RBBB, lAFB   Recent Labs: 06/07/2017: BUN 10; Creatinine, Ser 0.62; Hemoglobin 13.0; Platelets 237; Potassium 4.1; Sodium 137 06/08/2017: TSH 3.230    Lipid Panel  No results found for: CHOL, TRIG, HDL, CHOLHDL, VLDL, LDLCALC, LDLDIRECT   Wt Readings from Last 3 Encounters:  06/08/17 114 lb 8 oz (51.9 kg)  06/28/16 115 lb (52.2 kg)  06/25/16 115 lb 12.8 oz (52.5 kg)      Other studies Reviewed: Additional studies/ records that were reviewed today include: TTE 06/07/16, 30 day monitor 05/24/16  Review of the above records today demonstrates:  - LVEF 60-65%, normal wall thickness, normal wall motion, diastolic   dysfunction, normal LV filling pressure, normal LA size, normal   IVC.  Sinus rhythm with sinus tachycardia.   ASSESSMENT AND PLAN:  1.  Syncope: Normal echo and no evidence of arrhythmia that would cause her to have syncope on her 30-day monitor.  Due to that, we Tanita Palinkas plan for a link monitor that she has had multiple episodes of syncope a year apart.  Risks and benefits include bleeding and infection.  She understands the risks and is agreed to the procedure.  I did discuss with her no driving per [facility].  Current medicines are reviewed at length with the patient today.   The patient does not have  concerns regarding her medicines.  The following changes were made today:  none  Labs/ tests ordered today include:  No orders of the defined types were placed in this encounter.    Disposition:   FU with Rylen Swindler pending LINQ results  Signed, Retta Pitcher Meredith Leeds, MD  06/27/2017 8:17 AM     [facility] [address] [place] [address] [phone] (office) [phone] (fa

## 2017-06-27 NOTE — Patient Instructions (Addendum)
Medication Instructions:  Your physician recommends that you continue on your current medications as directed. Please refer to the Current Medication list given to you today.  * If you need a refill on your cardiac medications before your next appointment, please call your pharmacy. *  Labwork: None ordered  Testing/Procedures: Your physician has recommended that you have a loop recorder implanted. Please see instruction sheet given to you today.  Follow-Up: Your physician recommends that you schedule a follow-up appointment in: 7 - 10 days, after your procedure on 07/09/17, with device clinic for a wound check.  Your physician recommends that you schedule a follow-up appointment in: 3 months, after your procedure on 07/09/17, with Dr. Curt Bears.  Thank you for choosing [facility]!!   [name], RN [phone]

## 2017-07-09 ENCOUNTER — Encounter (HOSPITAL_COMMUNITY): Payer: Self-pay | Admitting: Cardiology

## 2017-07-09 ENCOUNTER — Ambulatory Visit (HOSPITAL_COMMUNITY)
Admission: RE | Admit: 2017-07-09 | Discharge: 2017-07-09 | Disposition: A | Discharge status: Home / Self Care | Payer: Medicare PPO | Source: Ambulatory Visit | Attending: Cardiology | Admitting: Cardiology

## 2017-07-09 ENCOUNTER — Ambulatory Visit (HOSPITAL_COMMUNITY)
Admission: RE | Disposition: A | Discharge status: Home / Self Care | Payer: Self-pay | Source: Ambulatory Visit | Attending: Cardiology

## 2017-07-09 DIAGNOSIS — R55 Syncope and collapse: Secondary | ICD-10-CM | POA: Diagnosis not present

## 2017-07-09 DIAGNOSIS — I10 Essential (primary) hypertension: Secondary | ICD-10-CM | POA: Diagnosis not present

## 2017-07-09 DIAGNOSIS — Z79890 Hormone replacement therapy (postmenopausal): Secondary | ICD-10-CM | POA: Diagnosis not present

## 2017-07-09 DIAGNOSIS — K219 Gastro-esophageal reflux disease without esophagitis: Secondary | ICD-10-CM | POA: Insufficient documentation

## 2017-07-09 DIAGNOSIS — Z79899 Other long term (current) drug therapy: Secondary | ICD-10-CM | POA: Insufficient documentation

## 2017-07-09 HISTORY — PX: LOOP RECORDER INSERTION: EP1214

## 2017-07-09 SURGERY — LOOP RECORDER INSERTION

## 2017-07-09 MED ORDER — LIDOCAINE HCL (PF) 1 % IJ SOLN
INTRAMUSCULAR | Status: DC | PRN
  Administered 2017-07-09: 20 mL

## 2017-07-09 MED ORDER — LIDOCAINE-EPINEPHRINE 1 %-1:100000 IJ SOLN
INTRAMUSCULAR | Status: AC
  Filled 2017-07-09: qty 1

## 2017-07-09 SURGICAL SUPPLY — 2 items
LOOP REVEAL LINQSYS (Prosthesis & Implant Heart) ×3 IMPLANT
PACK LOOP INSERTION (CUSTOM PROCEDURE TRAY) ×3 IMPLANT

## 2017-07-09 NOTE — H&P (Signed)
Karen Gomez has presented today for surgery, with the diagnosis of syncope.  The various methods of treatment have been discussed with the patient and family. After consideration of risks, benefits and other options for treatment, the patient has consented to  Procedure(s): LINQ implant as a surgical intervention .  Risks include but not limited to bleeding, infection, among others. The patient's history has been reviewed, patient examined, no change in status, stable for surgery.  I have reviewed the patient's chart and labs.  Questions were answered to the patient's satisfaction.    Alexander Aument Curt Bears, MD 07/09/2017 8:59 AM

## 2017-07-09 NOTE — Discharge Instructions (Signed)
Keep area clean and dry for 3 days. Sponge bath until Friday. Do not remove steri-strips, will address at your follow up appointment in Dr. Macky Lower office. If area becomes red, swollen, new drainage that has an odor and you have a low grade fever, call office.

## 2017-07-10 MED FILL — Lidocaine Inj 1% w/ Epinephrine-1:100000: INTRAMUSCULAR | Qty: 20 | Status: AC

## 2017-07-17 ENCOUNTER — Ambulatory Visit (INDEPENDENT_AMBULATORY_CARE_PROVIDER_SITE_OTHER): Payer: Self-pay | Admitting: *Deleted

## 2017-07-17 DIAGNOSIS — R55 Syncope and collapse: Secondary | ICD-10-CM

## 2017-07-17 LAB — CUP PACEART INCLINIC DEVICE CHECK
MDC IDC PG IMPLANT DT: 20190122
MDC IDC SESS DTM: 20190130093602

## 2017-07-17 NOTE — Progress Notes (Signed)
Wound check appointment. Steri-strips removed. Wound without redness or edema. Incision edges approximated, wound well healed. Loop check in clinic. Battery status: good. R-waves 0.36mV. 0 symptom episodes, 0 tachy episodes, 0 pause episodes, 0 brady episodes. 0 AF episodes. Monthly summary reports and ROV with [facility] 4/24

## 2017-07-29 DIAGNOSIS — Z79891 Long term (current) use of opiate analgesic: Secondary | ICD-10-CM | POA: Insufficient documentation

## 2017-08-12 ENCOUNTER — Ambulatory Visit (INDEPENDENT_AMBULATORY_CARE_PROVIDER_SITE_OTHER): Payer: Medicare PPO | Admitting: *Deleted

## 2017-08-12 DIAGNOSIS — R55 Syncope and collapse: Secondary | ICD-10-CM | POA: Diagnosis not present

## 2017-08-12 NOTE — Progress Notes (Signed)
[facility] Summary Report / Loop Recorder 

## 2017-08-15 DIAGNOSIS — M47812 Spondylosis without myelopathy or radiculopathy, cervical region: Secondary | ICD-10-CM | POA: Insufficient documentation

## 2017-09-13 ENCOUNTER — Ambulatory Visit (INDEPENDENT_AMBULATORY_CARE_PROVIDER_SITE_OTHER): Payer: Medicare PPO | Admitting: *Deleted

## 2017-09-13 DIAGNOSIS — R55 Syncope and collapse: Secondary | ICD-10-CM

## 2017-09-13 NOTE — Progress Notes (Signed)
[facility] Summary Report / Loop Recorder 

## 2017-09-16 LAB — CUP PACEART REMOTE DEVICE CHECK
Implantable Pulse Generator Implant Date: 20190122
MDC IDC SESS DTM: 20190401075144

## 2017-10-09 ENCOUNTER — Other Ambulatory Visit: Payer: Self-pay

## 2017-10-09 ENCOUNTER — Encounter: Payer: Self-pay | Admitting: Cardiology

## 2017-10-09 ENCOUNTER — Ambulatory Visit (INDEPENDENT_AMBULATORY_CARE_PROVIDER_SITE_OTHER): Payer: Medicare PPO | Admitting: Cardiology

## 2017-10-09 VITALS — BP 124/68 | HR 65 | Ht 62.5 in | Wt 117.2 lb

## 2017-10-09 DIAGNOSIS — R55 Syncope and collapse: Secondary | ICD-10-CM

## 2017-10-09 NOTE — Progress Notes (Signed)
Electrophysiology Office Note   Date:  10/09/2017   ID:  Karen, Gomez 1946-03-16, MRN [id number]  PCP:  Karen Stains, MD Primary Electrophysiologist:  Karen Cordial Meredith Leeds, MD    Chief Complaint  Patient presents with  . Loss of Consciousness     History of Present Illness: Karen Gomez is a 72 y.o. female who presents today for electrophysiology evaluation.   She has a history of hypertension.   She has had multiple episodes of syncope.  She was waiting in the line to see Karen Gomez at this time.  She had been waiting in line for approximately 20 minutes prior to the episode.  Per family, she slumped down and sustained no trauma.  She was unconscious for 5-10 seconds.  No seizure-like activity, loss of bowel bladder function or tongue biting.  She was not disoriented when she awoke.  A few days later, she had multiple episodes of syncope.  The first occurred when she was standing for multiple hours while making with her grandchildren.  She had 2 other syncopal episodes while standing.  She had one while sitting.  Prior to whole of her episodes, she had weakness, and dizziness.  She felt well after all of her episodes.  Today, denies symptoms of palpitations, chest pain, shortness of breath, orthopnea, PND, lower extremity edema, claudication, dizziness, presyncope, syncope, bleeding, or neurologic sequela. The patient is tolerating medications without difficulties.  Overall she is feeling well.  She has had no further episodes of syncope.  She is back to doing all of her daily activities.   Past Medical History:  Diagnosis Date  . Arthritis   . Cataracts, bilateral   . Complication of anesthesia    WHEN SHE HAD NECK SURGERY ?2002, HAD SOME N/V, NONE WITH HER OTHER SURGERIES  . GERD (gastroesophageal reflux disease)   . Hypertension   . Nerve pain    CYST IN LOWER LUMBAR SPINE  . PONV (postoperative nausea and vomiting)   . RBBB    Past Surgical History:  Procedure  Laterality Date  . CESAREAN SECTION     X 2  . DILATION AND CURETTAGE OF UTERUS     PART OF UTERINE POLYP REMOVAL  . LOOP RECORDER INSERTION N/A 07/09/2017   Procedure: LOOP RECORDER INSERTION;  Surgeon: Karen Haw, MD;  Location: [facility];  Service: Cardiovascular;  Laterality: N/A;  . NECK SURGERY     X 1 PLACEMENT OF TITANIUM PLATE  . REVERSE SHOULDER ARTHROPLASTY Right 06/28/2016   Procedure: REVERSE SHOULDER ARTHROPLASTY;  Surgeon: Karen Britain, MD;  Location: [facility];  Service: Orthopedics;  Laterality: Right;  . UTERINE POLYPS       Current Outpatient Medications  Medication Sig Dispense Refill  . CALCIUM-MAGNESIUM-VITAMIN D PO Take 1 tablet by mouth daily.    . cholecalciferol (VITAMIN D) 1000 units tablet Take 1,000 Units by mouth daily.    Marland Kitchen escitalopram (LEXAPRO) 5 MG tablet Take 5 mg by mouth daily.    Marland Kitchen estradiol (ESTRACE) 0.5 MG tablet Take 0.5 mg by mouth daily.    Marland Kitchen gabapentin (NEURONTIN) 300 MG capsule Take 300 mg by mouth at bedtime.     Marland Kitchen HYDROcodone-acetaminophen (NORCO) 10-325 MG tablet Take 0.5 tablets by mouth daily as needed for moderate pain.    Marland Kitchen ibuprofen (ADVIL,MOTRIN) 200 MG tablet Take 200 mg by mouth 2 (two) times daily as needed for headache or moderate pain.    Marland Kitchen lisinopril (PRINIVIL,ZESTRIL) 10 MG  tablet Take 10 mg by mouth daily.    Marland Kitchen loratadine (CLARITIN) 10 MG tablet Take 10 mg by mouth daily as needed for allergies.    . Magnesium 250 MG TABS Take 250 mg by mouth daily.    . Multiple Vitamin (MULTIVITAMIN) tablet Take 1 tablet by mouth daily.    . progesterone (PROMETRIUM) 100 MG capsule Take 100 mg by mouth at bedtime.      No current facility-administered medications for this visit.     Allergies:   Chocolate and Penicillins   Social History:  The patient  reports that she has never smoked. She has never used smokeless tobacco. She reports that she drinks about 8.4 oz of alcohol per week. She reports that she does not use  drugs.   Family History:  The patient's family history includes Dementia in her mother; Heart disease in her brother and father.   ROS:  Please see the history of present illness.   Otherwise, review of systems is positive for back pain, easy bruising.   All other systems are reviewed and negative.   PHYSICAL EXAM: VS:  BP 124/68   Pulse 65   Ht 5' 2.5" (1.588 m)   Wt 117 lb 3.2 oz (53.2 kg)   BMI 21.09 kg/m  , BMI Body mass index is 21.09 kg/m. GEN: Well nourished, well developed, in no acute distress  HEENT: normal  Neck: no JVD, carotid bruits, or masses Cardiac: RRR; no murmurs, rubs, or gallops,no edema  Respiratory:  clear to auscultation bilaterally, normal work of breathing GI: soft, nontender, nondistended, + BS MS: no deformity or atrophy  Skin: warm and dry, device site well healed Neuro:  Strength and sensation are intact Psych: euthymic mood, full affect  EKG:  EKG is ordered today. Personal review of the ekg ordered  shows SR, rate 59, RBBB, LAFB  Personal review of the device interrogation today. Results in [facility]: 06/07/2017: BUN 10; Creatinine, Ser 0.62; Hemoglobin 13.0; Platelets 237; Potassium 4.1; Sodium 137 06/08/2017: TSH 3.230    Lipid Panel  No results found for: CHOL, TRIG, HDL, CHOLHDL, VLDL, LDLCALC, LDLDIRECT   Wt Readings from Last 3 Encounters:  10/09/17 117 lb 3.2 oz (53.2 kg)  07/09/17 115 lb (52.2 kg)  06/27/17 115 lb (52.2 kg)      Other studies Reviewed: Additional studies/ records that were reviewed today include: TTE 06/07/16, 30 day monitor 05/24/16  Review of the above records today demonstrates:  - LVEF 60-65%, normal wall thickness, normal wall motion, diastolic   dysfunction, normal LV filling pressure, normal LA size, normal   IVC.  Sinus rhythm with sinus tachycardia.   ASSESSMENT AND PLAN:  1.  Syncope: Monitor implanted 07/09/2017.  No episodes of syncope or near syncope.  We Karen Gomez continue to monitor.   Reminded of no driving.  2.  Right bundle branch block, left anterior fascicular block: Is chronic.  It makes me worried that she could have some bradycardia related to her episodes of syncope.  We Kyani Simkin continue to monitor with link.  Current medicines are reviewed at length with the patient today.   The patient does not have concerns regarding her medicines.  The following changes were made today: None  Labs/ tests ordered today include:  Orders Placed This Encounter  Procedures  . EKG 12-Lead     Disposition:   FU with Nhi Butrum 12 months  Signed, Dania Marsan Meredith Leeds, MD  10/09/2017 8:49 AM  [place] [address] [phone] (office) [phone] (fa

## 2017-10-09 NOTE — Patient Instructions (Signed)
Medication Instructions:  Your physician recommends that you continue on your current medications as directed. Please refer to the Current Medication list given to you today.  Labwork: None ordered  Testing/Procedures: None ordered  Follow-Up: Continue with monthly home monitoring checks.  Your physician wants you to follow-up in: 1 year with Dr. Curt Bears.  You will receive a reminder letter in the mail two months in advance. If you don't receive a letter, please call our office to schedule the follow-up appointment.  * If you need a refill on your cardiac medications before your next appointment, please call your pharmacy.   *Please note that any paperwork needing to be filled out by the provider will need to be addressed at the front desk prior to seeing the provider. Please note that any FMLA, disability or other documents regarding health condition is subject to a $25.00 charge that must be received prior to completion of paperwork in the form of a money order or check.  Thank you for choosing [facility]!!   [name], RN [phone]

## 2017-10-14 DIAGNOSIS — M47816 Spondylosis without myelopathy or radiculopathy, lumbar region: Secondary | ICD-10-CM | POA: Insufficient documentation

## 2017-10-16 ENCOUNTER — Ambulatory Visit (INDEPENDENT_AMBULATORY_CARE_PROVIDER_SITE_OTHER): Payer: Medicare PPO | Admitting: *Deleted

## 2017-10-16 DIAGNOSIS — R55 Syncope and collapse: Secondary | ICD-10-CM | POA: Diagnosis not present

## 2017-10-17 NOTE — Progress Notes (Signed)
[facility] Summary Report / Loop Recorder 

## 2017-10-18 LAB — CUP PACEART REMOTE DEVICE CHECK
Date Time Interrogation Session: 20190503131023
MDC IDC PG IMPLANT DT: 20190122

## 2017-11-11 LAB — CUP PACEART REMOTE DEVICE CHECK
Date Time Interrogation Session: 20190527084032
MDC IDC PG IMPLANT DT: 20190122

## 2017-11-14 LAB — CUP PACEART INCLINIC DEVICE CHECK
Implantable Pulse Generator Implant Date: 20190122
MDC IDC SESS DTM: 20190424113700

## 2017-11-18 ENCOUNTER — Ambulatory Visit (INDEPENDENT_AMBULATORY_CARE_PROVIDER_SITE_OTHER): Payer: Medicare PPO | Admitting: *Deleted

## 2017-11-18 DIAGNOSIS — R55 Syncope and collapse: Secondary | ICD-10-CM | POA: Diagnosis not present

## 2017-11-19 NOTE — Progress Notes (Signed)
[facility] Summary Report / Loop Recorder 

## 2017-11-27 ENCOUNTER — Telehealth: Payer: Self-pay | Admitting: *Deleted

## 2017-11-27 NOTE — Telephone Encounter (Signed)
PPM implant scheduled for Friday, 6/14. Pre procedure lab work to be completed in the hospital the morning of the procedure. Instructions reviewed w/ pt Follow up appts made Patient verbalized understanding and agreeable to plan.

## 2017-11-27 NOTE — Telephone Encounter (Signed)
Reviewed episode with Dr. Rayann Heman, ECG appears Transient Heart block, recommends to call Dr. Curt Bears to discuss scheduling pacemaker implant. Spoke with Dr. Curt Bears to discuss pause episode noted on linq and pacemaker implant. Dr. Curt Bears recommends to schedule pacemaker implant Friday afternoon.    Spoke with patient regarding heartblock and pacemaker implant Friday afternoon. Advised patient I would send a message to [name] RN to give her a call regarding the details and answer any further questions. Advised patient not to drive and to call with worsening symptoms. Patient verbalized understanding.

## 2017-11-27 NOTE — Telephone Encounter (Signed)
Spoke with patient regarding pause 11/26/17 at 07:49 for 5 seconds. Patient states she was leaning over her kitchen sink washing her hair when she stood up and felt really dizzy like she was going to pass out. Patient stated she laid down on the floor and felt her heart racing. Patient states she ate breakfast this morning already. Advised patient not to drive. Advised patient we will review episode with Dr. Rayann Heman DOD and give her a call back.

## 2017-11-29 ENCOUNTER — Encounter (HOSPITAL_COMMUNITY): Payer: Self-pay | Admitting: General Practice

## 2017-11-29 ENCOUNTER — Encounter (HOSPITAL_COMMUNITY)
Admission: RE | Disposition: A | Discharge status: Home / Self Care | Payer: Self-pay | Source: Ambulatory Visit | Attending: Cardiology

## 2017-11-29 ENCOUNTER — Other Ambulatory Visit: Payer: Self-pay

## 2017-11-29 ENCOUNTER — Ambulatory Visit (HOSPITAL_COMMUNITY)
Admission: RE | Admit: 2017-11-29 | Discharge: 2017-11-30 | Disposition: A | Discharge status: Home / Self Care | Payer: Medicare PPO | Source: Ambulatory Visit | Attending: Cardiology | Admitting: Cardiology

## 2017-11-29 DIAGNOSIS — I10 Essential (primary) hypertension: Secondary | ICD-10-CM | POA: Insufficient documentation

## 2017-11-29 DIAGNOSIS — K219 Gastro-esophageal reflux disease without esophagitis: Secondary | ICD-10-CM | POA: Diagnosis not present

## 2017-11-29 DIAGNOSIS — R55 Syncope and collapse: Secondary | ICD-10-CM | POA: Diagnosis not present

## 2017-11-29 DIAGNOSIS — Z96619 Presence of unspecified artificial shoulder joint: Secondary | ICD-10-CM | POA: Insufficient documentation

## 2017-11-29 DIAGNOSIS — Z91018 Allergy to other foods: Secondary | ICD-10-CM | POA: Diagnosis not present

## 2017-11-29 DIAGNOSIS — I4510 Unspecified right bundle-branch block: Secondary | ICD-10-CM | POA: Insufficient documentation

## 2017-11-29 DIAGNOSIS — I442 Atrioventricular block, complete: Secondary | ICD-10-CM | POA: Diagnosis present

## 2017-11-29 DIAGNOSIS — R001 Bradycardia, unspecified: Secondary | ICD-10-CM | POA: Insufficient documentation

## 2017-11-29 DIAGNOSIS — Z8249 Family history of ischemic heart disease and other diseases of the circulatory system: Secondary | ICD-10-CM | POA: Insufficient documentation

## 2017-11-29 DIAGNOSIS — Z95818 Presence of other cardiac implants and grafts: Secondary | ICD-10-CM

## 2017-11-29 DIAGNOSIS — Z880 Allergy status to penicillin: Secondary | ICD-10-CM | POA: Diagnosis not present

## 2017-11-29 DIAGNOSIS — M1990 Unspecified osteoarthritis, unspecified site: Secondary | ICD-10-CM | POA: Diagnosis not present

## 2017-11-29 DIAGNOSIS — Z950 Presence of cardiac pacemaker: Secondary | ICD-10-CM | POA: Diagnosis not present

## 2017-11-29 DIAGNOSIS — H269 Unspecified cataract: Secondary | ICD-10-CM | POA: Insufficient documentation

## 2017-11-29 DIAGNOSIS — Z9889 Other specified postprocedural states: Secondary | ICD-10-CM

## 2017-11-29 HISTORY — PX: INSERT / REPLACE / REMOVE PACEMAKER: SUR710

## 2017-11-29 HISTORY — DX: Pneumonia, unspecified organism: J18.9

## 2017-11-29 HISTORY — DX: Other forms of scoliosis, site unspecified: M41.80

## 2017-11-29 HISTORY — DX: Other specified postprocedural states: Z98.890

## 2017-11-29 HISTORY — DX: Anxiety disorder, unspecified: F41.9

## 2017-11-29 HISTORY — PX: LOOP RECORDER REMOVAL: EP1215

## 2017-11-29 HISTORY — PX: PACEMAKER IMPLANT: EP1218

## 2017-11-29 HISTORY — DX: Presence of cardiac pacemaker: Z95.0

## 2017-11-29 HISTORY — DX: Other intervertebral disc degeneration, lumbar region without mention of lumbar back pain or lower extremity pain: M51.369

## 2017-11-29 HISTORY — DX: Other intervertebral disc degeneration, lumbar region: M51.36

## 2017-11-29 HISTORY — DX: Other secondary scoliosis, site unspecified: M41.50

## 2017-11-29 HISTORY — DX: Other cervical disc degeneration, unspecified cervical region: M50.30

## 2017-11-29 LAB — BASIC METABOLIC PANEL
ANION GAP: 9 (ref 5–15)
BUN: 7 mg/dL (ref 6–20)
CHLORIDE: 102 mmol/L (ref 101–111)
CO2: 27 mmol/L (ref 22–32)
Calcium: 9 mg/dL (ref 8.9–10.3)
Creatinine, Ser: 0.67 mg/dL (ref 0.44–1.00)
GFR calc non Af Amer: >60 mL/min (ref >60)
GLUCOSE: 95 mg/dL (ref 65–99)
Potassium: 3.5 mmol/L (ref 3.5–5.1)
Sodium: 138 mmol/L (ref 135–145)

## 2017-11-29 LAB — CBC
HCT: 43.8 % (ref 36.0–46.0)
HEMOGLOBIN: 14 g/dL (ref 12.0–15.0)
MCH: 33.7 pg (ref 26.0–34.0)
MCHC: 32 g/dL (ref 30.0–36.0)
MCV: 105.3 fL — AB (ref 78.0–100.0)
Platelets: 267 10*3/uL (ref 150–400)
RBC: 4.16 MIL/uL (ref 3.87–5.11)
RDW: 11.9 % (ref 11.5–15.5)
WBC: 4.9 10*3/uL (ref 4.0–10.5)

## 2017-11-29 LAB — SURGICAL PCR SCREEN
MRSA, PCR: NEGATIVE
STAPHYLOCOCCUS AUREUS: POSITIVE — AB

## 2017-11-29 SURGERY — PACEMAKER IMPLANT

## 2017-11-29 MED ORDER — GABAPENTIN 300 MG PO CAPS
300.0000 mg | ORAL_CAPSULE | Freq: Every day | ORAL | Status: DC
  Administered 2017-11-29: 300 mg via ORAL
  Filled 2017-11-29: qty 1

## 2017-11-29 MED ORDER — MAGNESIUM OXIDE 400 (241.3 MG) MG PO TABS
200.0000 mg | ORAL_TABLET | Freq: Every day | ORAL | Status: DC
  Filled 2017-11-29: qty 1

## 2017-11-29 MED ORDER — LORATADINE 10 MG PO TABS: 10.0000 mg | ORAL_TABLET | Freq: Every day | ORAL | Status: DC | PRN

## 2017-11-29 MED ORDER — MUPIROCIN 2 % EX OINT
TOPICAL_OINTMENT | Freq: Once | CUTANEOUS | Status: AC
  Administered 2017-11-29: 22:00:00 via NASAL
  Filled 2017-11-29: qty 22

## 2017-11-29 MED ORDER — HEPARIN (PORCINE) IN NACL 1000-0.9 UT/500ML-% IV SOLN
INTRAVENOUS | Status: AC
  Filled 2017-11-29: qty 500

## 2017-11-29 MED ORDER — LISINOPRIL 10 MG PO TABS
10.0000 mg | ORAL_TABLET | Freq: Every day | ORAL | Status: DC
  Administered 2017-11-30: 10 mg via ORAL
  Filled 2017-11-29: qty 1

## 2017-11-29 MED ORDER — MIDAZOLAM HCL 5 MG/5ML IJ SOLN
INTRAMUSCULAR | Status: AC
  Filled 2017-11-29: qty 5

## 2017-11-29 MED ORDER — VANCOMYCIN HCL IN DEXTROSE 1-5 GM/200ML-% IV SOLN
INTRAVENOUS | Status: AC
  Filled 2017-11-29: qty 200

## 2017-11-29 MED ORDER — GENTAMICIN SULFATE 40 MG/ML IJ SOLN
INTRAMUSCULAR | Status: DC | PRN
  Administered 2017-11-29: 16:00:00

## 2017-11-29 MED ORDER — SODIUM CHLORIDE 0.9 % IV SOLN
INTRAVENOUS | Status: AC
  Filled 2017-11-29: qty 2

## 2017-11-29 MED ORDER — SODIUM CHLORIDE 0.9 % IV SOLN
INTRAVENOUS | Status: DC
  Administered 2017-11-29: 13:00:00 via INTRAVENOUS

## 2017-11-29 MED ORDER — VANCOMYCIN HCL IN DEXTROSE 1-5 GM/200ML-% IV SOLN
1000.0000 mg | Freq: Two times a day (BID) | INTRAVENOUS | Status: AC
  Administered 2017-11-30: 1000 mg via INTRAVENOUS
  Filled 2017-11-29: qty 200

## 2017-11-29 MED ORDER — FENTANYL CITRATE (PF) 100 MCG/2ML IJ SOLN
INTRAMUSCULAR | Status: DC | PRN
  Administered 2017-11-29 (×3): 25 ug via INTRAVENOUS

## 2017-11-29 MED ORDER — IBUPROFEN 200 MG PO TABS
200.0000 mg | ORAL_TABLET | Freq: Two times a day (BID) | ORAL | Status: DC | PRN
  Administered 2017-11-29: 400 mg via ORAL
  Filled 2017-11-29: qty 2

## 2017-11-29 MED ORDER — HYDROCODONE-ACETAMINOPHEN 10-325 MG PO TABS
0.5000 | ORAL_TABLET | Freq: Every day | ORAL | Status: DC | PRN
  Administered 2017-11-29 – 2017-11-30 (×2): 0.5 via ORAL
  Filled 2017-11-29 (×2): qty 1

## 2017-11-29 MED ORDER — FENTANYL CITRATE (PF) 100 MCG/2ML IJ SOLN
INTRAMUSCULAR | Status: AC
  Filled 2017-11-29: qty 2

## 2017-11-29 MED ORDER — VITAMIN D 1000 UNITS PO TABS
1000.0000 [IU] | ORAL_TABLET | Freq: Every day | ORAL | Status: DC
  Filled 2017-11-29: qty 1

## 2017-11-29 MED ORDER — ESTRADIOL 1 MG PO TABS
0.5000 mg | ORAL_TABLET | Freq: Every day | ORAL | Status: DC
  Filled 2017-11-29 (×2): qty 0.5

## 2017-11-29 MED ORDER — ACETAMINOPHEN 325 MG PO TABS: 325.0000 mg | ORAL_TABLET | ORAL | Status: DC | PRN

## 2017-11-29 MED ORDER — SODIUM CHLORIDE 0.9 % IV SOLN
80.0000 mg | INTRAVENOUS | Status: AC
  Administered 2017-11-29: 80 mg

## 2017-11-29 MED ORDER — ONDANSETRON HCL 4 MG/2ML IJ SOLN: 4.0000 mg | Freq: Four times a day (QID) | INTRAMUSCULAR | Status: DC | PRN

## 2017-11-29 MED ORDER — MAGNESIUM 250 MG PO TABS: 250.0000 mg | ORAL_TABLET | Freq: Every day | ORAL | Status: DC

## 2017-11-29 MED ORDER — VANCOMYCIN HCL IN DEXTROSE 1-5 GM/200ML-% IV SOLN
1000.0000 mg | INTRAVENOUS | Status: AC
  Administered 2017-11-29: 1000 mg via INTRAVENOUS
  Filled 2017-11-29: qty 200

## 2017-11-29 MED ORDER — MUPIROCIN 2 % EX OINT
TOPICAL_OINTMENT | CUTANEOUS | Status: AC
  Filled 2017-11-29: qty 22

## 2017-11-29 MED ORDER — LIDOCAINE HCL (PF) 1 % IJ SOLN
INTRAMUSCULAR | Status: AC
  Filled 2017-11-29: qty 30

## 2017-11-29 MED ORDER — ESCITALOPRAM OXALATE 10 MG PO TABS
5.0000 mg | ORAL_TABLET | Freq: Every day | ORAL | Status: DC
  Administered 2017-11-30: 5 mg via ORAL
  Filled 2017-11-29: qty 1

## 2017-11-29 MED ORDER — LIDOCAINE HCL (PF) 1 % IJ SOLN
INTRAMUSCULAR | Status: DC | PRN
  Administered 2017-11-29: 60 mL

## 2017-11-29 MED ORDER — HEPARIN (PORCINE) IN NACL 2-0.9 UNITS/ML
INTRAMUSCULAR | Status: AC | PRN
  Administered 2017-11-29: 500 mL

## 2017-11-29 MED ORDER — MIDAZOLAM HCL 5 MG/5ML IJ SOLN
INTRAMUSCULAR | Status: DC | PRN
  Administered 2017-11-29 (×3): 1 mg via INTRAVENOUS

## 2017-11-29 MED ORDER — PROGESTERONE MICRONIZED 100 MG PO CAPS
100.0000 mg | ORAL_CAPSULE | Freq: Every day | ORAL | Status: DC
  Filled 2017-11-29: qty 1

## 2017-11-29 MED ORDER — ADULT MULTIVITAMIN W/MINERALS CH
1.0000 | ORAL_TABLET | Freq: Every day | ORAL | Status: DC
  Filled 2017-11-29: qty 1

## 2017-11-29 MED ORDER — CALCIUM CARBONATE-VITAMIN D 500-200 MG-UNIT PO TABS
1.0000 | ORAL_TABLET | Freq: Every day | ORAL | Status: DC
  Filled 2017-11-29: qty 1

## 2017-11-29 SURGICAL SUPPLY — 8 items
CABLE SURGICAL S-101-97-12 (CABLE) ×2 IMPLANT
IPG PACE AZUR XT DR MRI W1DR01 (Pacemaker) IMPLANT
LEAD CAPSURE NOVUS 45CM (Lead) ×1 IMPLANT
LEAD CAPSURE NOVUS 5076-52CM (Lead) ×1 IMPLANT
PACE AZURE XT DR MRI W1DR01 (Pacemaker) ×2 IMPLANT
PAD DEFIB LIFELINK (PAD) ×1 IMPLANT
SHEATH CLASSIC 7F (SHEATH) ×4 IMPLANT
TRAY PACEMAKER INSERTION (PACKS) ×1 IMPLANT

## 2017-11-29 NOTE — H&P (Signed)
Electrophysiology Office Note   Date:  11/29/2017   ID:  Karen, Gomez [birth year], MRN [id number]  PCP:  Harlan Stains, MD Primary Electrophysiologist:  Brach Birdsall Meredith Leeds, MD    No chief complaint on file.    History of Present Illness: Karen Gomez is a 72 y.o. female who presents today for electrophysiology evaluation.   She has a history of hypertension.   She has had multiple episodes of syncope.  He had a Linq monitor implanted 07/09/2017 which showed complete heart block and symptoms of near syncope.  Plan for pacemaker implant today.  Today, denies symptoms of palpitations, chest pain, shortness of breath, orthopnea, PND, lower extremity edema, claudication, dizziness, presyncope, syncope, bleeding, or neurologic sequela. The patient is tolerating medications without difficulties.     Past Medical History:  Diagnosis Date  . Arthritis   . Cataracts, bilateral   . Complication of anesthesia    WHEN SHE HAD NECK SURGERY ?2002, HAD SOME N/V, NONE WITH HER OTHER SURGERIES  . GERD (gastroesophageal reflux disease)   . Hypertension   . Nerve pain    CYST IN LOWER LUMBAR SPINE  . PONV (postoperative nausea and vomiting)   . RBBB    Past Surgical History:  Procedure Laterality Date  . CESAREAN SECTION     X 2  . DILATION AND CURETTAGE OF UTERUS     PART OF UTERINE POLYP REMOVAL  . LOOP RECORDER INSERTION N/A 07/09/2017   Procedure: LOOP RECORDER INSERTION;  Surgeon: Constance Haw, MD;  Location: [facility];  Service: Cardiovascular;  Laterality: N/A;  . NECK SURGERY     X 1 PLACEMENT OF TITANIUM PLATE  . REVERSE SHOULDER ARTHROPLASTY Right 06/28/2016   Procedure: REVERSE SHOULDER ARTHROPLASTY;  Surgeon: Justice Britain, MD;  Location: [facility];  Service: Orthopedics;  Laterality: Right;  . UTERINE POLYPS       Current Facility-Administered Medications  Medication Dose Route Frequency Provider Last Rate Last Dose  . 0.9 %  sodium chloride infusion    Intravenous Continuous Constance Haw, MD 50 mL/hr at 11/29/17 1240    . gentamicin (GARAMYCIN) 80 mg in sodium chloride 0.9 % 500 mL irrigation  80 mg Irrigation On Call Annlee Glandon Hassell Done, MD      . mupirocin ointment (BACTROBAN) 2 %   Nasal Once Natayla Cadenhead Hassell Done, MD      . mupirocin ointment (BACTROBAN) 2 %           . vancomycin (VANCOCIN) IVPB 1000 mg/200 mL premix  1,000 mg Intravenous On Call Constance Haw, MD        Allergies:   Chocolate and Penicillins   Social History:  The patient  reports that she has never smoked. She has never used smokeless tobacco. She reports that she drinks about 8.4 oz of alcohol per week. She reports that she does not use drugs.   Family History:  The patient's family history includes Dementia in her mother; Heart disease in her brother and father.   ROS:  Please see the history of present illness.   Otherwise, review of systems is positive for none.   All other systems are reviewed and negative.   PHYSICAL EXAM: VS:  BP 123/71   Pulse (!) 54   Temp 98 F (36.7 C)   Resp 18   Ht 5\' 3"  (1.6 m)   Wt 115 lb (52.2 kg)   SpO2 100%   BMI 20.37 kg/m  ,  BMI Body mass index is 20.37 kg/m. GEN: Well nourished, well developed, in no acute distress  HEENT: normal  Neck: no JVD, carotid bruits, or masses Cardiac: RRR; no murmurs, rubs, or gallops,no edema  Respiratory:  clear to auscultation bilaterally, normal work of breathing GI: soft, nontender, nondistended, + BS MS: no deformity or atrophy  Skin: warm and dry Neuro:  Strength and sensation are intact Psych: euthymic mood, full affect  Recent Labs: 06/08/2017: TSH 3.230 11/29/2017: BUN 7; Creatinine, Ser 0.67; Hemoglobin 14.0; Platelets 267; Potassium 3.5; Sodium 138    Lipid Panel  No results found for: CHOL, TRIG, HDL, CHOLHDL, VLDL, LDLCALC, LDLDIRECT   Wt Readings from Last 3 Encounters:  11/29/17 115 lb (52.2 kg)  10/09/17 117 lb 3.2 oz (53.2 kg)  07/09/17 115 lb  (52.2 kg)      Other studies Reviewed: Additional studies/ records that were reviewed today include: TTE 06/07/16, 30 day monitor 05/24/16  Review of the above records today demonstrates:  - LVEF 60-65%, normal wall thickness, normal wall motion, diastolic   dysfunction, normal LV filling pressure, normal LA size, normal   IVC.  ASSESSMENT AND PLAN:  1.  Syncope with intermittent complete heart block:: Linq monitor implanted 07/09/2017.  Linq monitor with evidence of complete heart block.  Likely the cause of her syncope as her symptoms are similar.  Plan for pacemaker implant.  [name] has presented today for surgery, with the diagnosis of syncope, complete AV block.  The various methods of treatment have been discussed with the patient and family. After consideration of risks, benefits and other options for treatment, the patient has consented to  Procedure(s): Pacemaker implant as a surgical intervention .  Risks include but not limited to bleeding, tamponade, infection, pneumothorax, among others. The patient's history has been reviewed, patient examined, no change in status, stable for surgery.  I have reviewed the patient's chart and labs.  Questions were answered to the patient's satisfaction.    Jacorie Ernsberger Curt Bears, MD 11/29/2017 1:32 PM

## 2017-11-29 NOTE — Progress Notes (Signed)
Received patient post pacemaker placement. Alert and oriented with left arm sling in place. Pacemaker incision site left chest dressing CDI, dressing in left lower chest old blood, marked. Patient instructed to minimize movement of left arm, verbalized understanding.

## 2017-11-29 NOTE — Plan of Care (Signed)
  Problem: Education: Goal: Knowledge of General Education information will improve Outcome: Progressing   Problem: Education: Goal: Knowledge of General Education information will improve Outcome: Progressing   Problem: Safety: Goal: Ability to remain free from injury will improve Outcome: Progressing   Problem: Pain Managment: Goal: General experience of comfort will improve Outcome: Progressing

## 2017-11-30 ENCOUNTER — Encounter (HOSPITAL_COMMUNITY): Payer: Self-pay | Admitting: Cardiology

## 2017-11-30 ENCOUNTER — Ambulatory Visit (HOSPITAL_COMMUNITY): Payer: Medicare PPO

## 2017-11-30 ENCOUNTER — Other Ambulatory Visit: Payer: Self-pay

## 2017-11-30 DIAGNOSIS — I10 Essential (primary) hypertension: Secondary | ICD-10-CM | POA: Diagnosis not present

## 2017-11-30 DIAGNOSIS — Z9889 Other specified postprocedural states: Secondary | ICD-10-CM

## 2017-11-30 DIAGNOSIS — Z950 Presence of cardiac pacemaker: Secondary | ICD-10-CM

## 2017-11-30 DIAGNOSIS — I4510 Unspecified right bundle-branch block: Secondary | ICD-10-CM | POA: Diagnosis not present

## 2017-11-30 DIAGNOSIS — K219 Gastro-esophageal reflux disease without esophagitis: Secondary | ICD-10-CM | POA: Diagnosis not present

## 2017-11-30 DIAGNOSIS — I442 Atrioventricular block, complete: Secondary | ICD-10-CM | POA: Diagnosis not present

## 2017-11-30 HISTORY — DX: Presence of cardiac pacemaker: Z95.0

## 2017-11-30 HISTORY — DX: Other specified postprocedural states: Z98.890

## 2017-11-30 NOTE — Discharge Summary (Addendum)
Discharge Summary    Patient ID: Karen Gomez,  MRN: [id number], DOB/AGE: 1945-09-24 72 y.o.  Admit date: 11/29/2017 Discharge date: 11/30/2017  Primary Care Provider: Harlan Stains Primary Cardiologist: Constance Haw, MD  Discharge Diagnoses    Principal Problem:   CHB (complete heart block) [facility]) Active Problems:   S/P placement of cardiac pacemaker 11/29/17 MDT   History of loop recorder- removed 11/29/17   Allergies Allergies  Allergen Reactions  . Chocolate Swelling  . Penicillins Anaphylaxis    Has patient had a PCN reaction causing immediate rash, facial/tongue/throat swelling, SOB or lightheadedness with hypotension: Yes Has patient had a PCN reaction causing severe rash involving mucus membranes or skin necrosis: No Has patient had a PCN reaction that required hospitalization: Unknown Has patient had a PCN reaction occurring within the last 10 years: No If all of the above answers are "NO", then may proceed with Cephalosporin use.     Diagnostic Studies/Procedures    11/29/17   Procedures   LOOP RECORDER REMOVAL  PACEMAKER IMPLANT  Conclusion   SURGEON:  Will Meredith Leeds, MD     PREPROCEDURE DIAGNOSIS:  Syncope, intermittent complete AV block    POSTPROCEDURE DIAGNOSIS:  Syncope, intermittent complete AV block     PROCEDURES:   1.  Pacemaker implantation.     INTRODUCTION: Karen Gomez is a 72 y.o. female  with a history of bradycardia who presents today for pacemaker implantation.  The patient reports intermittent episodes of syncope over the past few months.  No reversible causes have been identified.  The patient therefore presents today for pacemaker implantation.        _____________   History of Present Illness     1 yoF with hx of HTN, and multiple episodes of syncope.  On one episode she had been standing for 20 min and then she slumpled down -no trauma and ws unconscious for 5-10 sec. Days later multiple episodes of syncope.   She had a Linq placed 07/09/17 this revelaed syncope associated with intermittent complete AV block.  Arrangement were made for her to have PPM 11/29/17.  Hospital Course     Consultants: none   Pt tolerated the pacemaker placement without problems.   Today 11/30/17 CXR with no pneumothorax, no active disease.  Her device has been interrogated and the numbers are good.  EKG SR  Tele with atrial pacing.   She has been seen and evaluated by Dr. Rayann Heman and found stable for discharge.   She has follow up appts made.   _____________  Discharge Vitals Blood pressure 132/66, pulse 78, temperature 98.2 F (36.8 C), temperature source Oral, resp. rate 14, height 5\' 3"  (1.6 m), weight 116 lb 6.4 oz (52.8 kg), SpO2 98 %.  Filed Weights   11/29/17 1152 11/29/17 1716 11/30/17 0407  Weight: 115 lb (52.2 kg) 118 lb (53.5 kg) 116 lb 6.4 oz (52.8 kg)   General:Pleasant affect, NAD Skin:Warm and dry, brisk capillary refill HEENT:normocephalic, sclera clear, mucus membranes moist Neck:supple, no JVD, no bruits  Heart:S1S2 RRR without murmur, gallup, rub or click Lungs:clear without rales, rhonchi, or wheezes MEQ:ASTM, non tender, + BS, do not palpate liver spleen or masses Ext:no lower ext edema, 2+ radial pulses Neuro:alert and oriented, MAE, follows commands, + facial symmetry Pacer site without hematoma.   Labs & Radiologic Studies    CBC Recent Labs    11/29/17 0818  WBC 4.9  HGB 14.0  HCT 43.8  MCV 105.3*  PLT 101   Basic Metabolic Panel Recent Labs    11/29/17 0818  NA 138  K 3.5  CL 102  CO2 27  GLUCOSE 95  BUN 7  CREATININE 0.67  CALCIUM 9.0   Liver Function Tests No results for input(s): AST, ALT, ALKPHOS, BILITOT, PROT, ALBUMIN in the last 72 hours. No results for input(s): LIPASE, AMYLASE in the last 72 hours. Cardiac Enzymes No results for input(s): CKTOTAL, CKMB, CKMBINDEX, TROPONINI in the last 72 hours. BNP Invalid input(s): POCBNP D-Dimer No results for  input(s): DDIMER in the last 72 hours. Hemoglobin A1C No results for input(s): HGBA1C in the last 72 hours. Fasting Lipid Panel No results for input(s): CHOL, HDL, LDLCALC, TRIG, CHOLHDL, LDLDIRECT in the last 72 hours. Thyroid Function Tests No results for input(s): TSH, T4TOTAL, T3FREE, THYROIDAB in the last 72 hours.  Invalid input(s): FREET3 _____________  Dg Chest 2 View  Result Date: 11/30/2017 CLINICAL DATA:  Post ICD placement EXAM: CHEST - 2 VIEW COMPARISON:  06/07/2017 FINDINGS: Left pacer has been placed with leads in the right atrium and right ventricle. No pneumothorax. Heart is normal size. Lungs clear. No effusions or acute bony abnormality. IMPRESSION: Left pacer placement.  No pneumothorax.  No active disease. Electronically Signed   By: Rolm Baptise M.D.   On: 11/30/2017 07:32   Disposition   Pt is being discharged home today in good condition.  Follow-up Plans & Appointments   Heart Healthy Diet   Follow-up Information    [facility] Follow up on 12/11/2017.   Specialty:  Cardiology Why:  12:00PM (noon), wound check visit Contact information: [address], [address]       Constance Haw, MD Follow up on 03/11/2018.   Specialty:  Cardiology Why:  9:45AM Contact information: [place] [address] [phone]            Discharge Medications   Allergies as of 11/30/2017      Reactions   Chocolate Swelling   Penicillins Anaphylaxis   Has patient had a PCN reaction causing immediate rash, facial/tongue/throat swelling, SOB or lightheadedness with hypotension: Yes Has patient had a PCN reaction causing severe rash involving mucus membranes or skin necrosis: No Has patient had a PCN reaction that required hospitalization: Unknown Has patient had a PCN reaction occurring within the last 10 years: No If all of the above answers are "NO", then may  proceed with Cephalosporin use.      Medication List    STOP taking these medications   ibuprofen 200 MG tablet Commonly known as:  ADVIL,MOTRIN     TAKE these medications   calcium-vitamin D 500-200 MG-UNIT tablet Commonly known as:  OSCAL WITH D Take 1 tablet by mouth daily.   cholecalciferol 1000 units tablet Commonly known as:  VITAMIN D Take 1,000 Units by mouth daily.   escitalopram 5 MG tablet Commonly known as:  LEXAPRO Take 5 mg by mouth daily.   estradiol 0.5 MG tablet Commonly known as:  ESTRACE Take 0.5 mg by mouth daily.   gabapentin 300 MG capsule Commonly known as:  NEURONTIN Take 300 mg by mouth at bedtime.   HYDROcodone-acetaminophen 10-325 MG tablet Commonly known as:  NORCO Take 0.5 tablets by mouth daily as needed for moderate pain.   lisinopril 10 MG tablet Commonly known as:  PRINIVIL,ZESTRIL Take 10 mg by mouth daily.   loratadine 10 MG tablet  Commonly known as:  CLARITIN Take 10 mg by mouth daily as needed for allergies.   Magnesium 250 MG Tabs Take 250 mg by mouth daily.   multivitamin with minerals Tabs tablet Take 1 tablet by mouth daily. Centrum Silver   progesterone 100 MG capsule Commonly known as:  PROMETRIUM Take 100 mg by mouth at bedtime.        Acute coronary syndrome (MI, NSTEMI, STEMI, etc) this admission?: No.    Outstanding Labs/Studies   none  Duration of Discharge Encounter   Greater than 30 minutes including physician time.  Signed, Huel Coventry, NP 11/30/2017, 10:35 AM  Thompson Grayer MD, [facility] 11/30/2017 3:01 PM

## 2017-11-30 NOTE — Discharge Instructions (Signed)
° ° °  Supplemental Discharge Instructions for  Pacemaker Patients  Activity No heavy lifting or vigorous activity with your left/right arm for 6 to 8 weeks.  Do not raise your left/right arm above your head for one week.  Gradually raise your affected arm as drawn below.              12/03/17                    12/04/17                     12/05/17                  12/06/17 __  NO DRIVING for   1 week  ; you may begin driving on  5/78/46  .  WOUND CARE - Keep the wound area clean and dry.  Do not get this area wet, no showers until cleared to at your wound check visit . - The tape/steri-strips on your wound will fall off; do not pull them off.  No bandage is needed on the site.  DO  NOT apply any creams, oils, or ointments to the wound area. - If you notice any drainage or discharge from the wound, any swelling or bruising at the site, or you develop a fever > 101? F after you are discharged home, call the office at once.  Special Instructions - You are still able to use cellular telephones; use the ear opposite the side where you have your pacemaker/defibrillator.  Avoid carrying your cellular phone near your device. - When traveling through airports, show security personnel your identification card to avoid being screened in the metal detectors.  Ask the security personnel to use the hand wand. - Avoid arc welding equipment, MRI testing (magnetic resonance imaging), TENS units (transcutaneous nerve stimulators).  Call the office for questions about other devices. - Avoid electrical appliances that are in poor condition or are not properly grounded. - Microwave ovens are safe to be near or to operate.  Heart Healthy diet

## 2017-11-30 NOTE — Progress Notes (Signed)
Doing well s/p PPM   CXR reveals stable leads, no ptx Device interrogation is personally reviewed and reveals normal device function.   DC to home with routine device care and follow-up  Thompson Grayer MD, [facility] 11/30/2017 10:25 AM

## 2017-12-02 ENCOUNTER — Encounter (HOSPITAL_COMMUNITY): Payer: Self-pay | Admitting: Cardiology

## 2017-12-02 ENCOUNTER — Encounter: Payer: Self-pay | Admitting: Cardiology

## 2017-12-05 ENCOUNTER — Other Ambulatory Visit: Payer: Self-pay | Admitting: Internal Medicine

## 2017-12-11 ENCOUNTER — Ambulatory Visit (INDEPENDENT_AMBULATORY_CARE_PROVIDER_SITE_OTHER): Payer: Medicare PPO | Admitting: *Deleted

## 2017-12-11 ENCOUNTER — Telehealth: Payer: Self-pay

## 2017-12-11 DIAGNOSIS — R55 Syncope and collapse: Secondary | ICD-10-CM

## 2017-12-11 DIAGNOSIS — Z950 Presence of cardiac pacemaker: Secondary | ICD-10-CM | POA: Diagnosis not present

## 2017-12-11 DIAGNOSIS — I442 Atrioventricular block, complete: Secondary | ICD-10-CM

## 2017-12-11 LAB — CUP PACEART INCLINIC DEVICE CHECK
Brady Statistic RA Percent Paced: 1.1 %
Implantable Lead Implant Date: 20190614
Implantable Lead Location: 753860
Implantable Lead Model: 5076
Implantable Pulse Generator Implant Date: 20190614
Lead Channel Impedance Value: 475 Ohm
Lead Channel Pacing Threshold Amplitude: 0.75 V
Lead Channel Pacing Threshold Amplitude: 1.75 V
Lead Channel Sensing Intrinsic Amplitude: 15.8 mV
Lead Channel Sensing Intrinsic Amplitude: 2.3 mV
MDC IDC LEAD IMPLANT DT: 20190614
MDC IDC LEAD LOCATION: 753859
MDC IDC MSMT LEADCHNL RA IMPEDANCE VALUE: 513 Ohm
MDC IDC MSMT LEADCHNL RA PACING THRESHOLD PULSEWIDTH: 0.4 ms
MDC IDC MSMT LEADCHNL RV PACING THRESHOLD PULSEWIDTH: 0.4 ms
MDC IDC SESS DTM: 20190626124627
MDC IDC STAT BRADY RV PERCENT PACED: 0.1 %

## 2017-12-11 NOTE — Progress Notes (Signed)
Wound check appointment s/p PPM insertion and ILR explant. Steri-strips removed. Wounds without redness or edema. Incision edges approximated, wound well healed. Normal device function.  RV threshold, sensing, and impedances consistent with implant measurements. RA threshold elevated from 0.8V @ 0.54ms to 1.75v @0 .57ms, sensing stable, 1.1% RA pacing, implant indication- syncope r/t CHB. Device programmed at 3.5V for extra safety margin until 3 month visit. Histogram distribution appropriate for patient and level of activity. No mode switches or high ventricular rates noted. Patient educated about wound care, arm mobility, lifting restrictions. ROV 03/11/18 with WC.

## 2017-12-11 NOTE — Telephone Encounter (Signed)
Spoke with pt and reminded pt of remote transmission that is due today. Pt verbalized understanding.   

## 2017-12-25 LAB — CUP PACEART REMOTE DEVICE CHECK
Date Time Interrogation Session: 20190603193649
Implantable Pulse Generator Implant Date: 20190122

## 2018-03-11 ENCOUNTER — Encounter: Payer: Self-pay | Admitting: Cardiology

## 2018-03-11 ENCOUNTER — Ambulatory Visit: Payer: Medicare PPO | Admitting: Cardiology

## 2018-03-11 VITALS — BP 124/66 | HR 59 | Ht 63.0 in | Wt 115.0 lb

## 2018-03-11 DIAGNOSIS — R55 Syncope and collapse: Secondary | ICD-10-CM | POA: Diagnosis not present

## 2018-03-11 DIAGNOSIS — M48061 Spinal stenosis, lumbar region without neurogenic claudication: Secondary | ICD-10-CM | POA: Insufficient documentation

## 2018-03-11 DIAGNOSIS — I442 Atrioventricular block, complete: Secondary | ICD-10-CM | POA: Diagnosis not present

## 2018-03-11 DIAGNOSIS — M75121 Complete rotator cuff tear or rupture of right shoulder, not specified as traumatic: Secondary | ICD-10-CM | POA: Insufficient documentation

## 2018-03-11 DIAGNOSIS — K219 Gastro-esophageal reflux disease without esophagitis: Secondary | ICD-10-CM | POA: Insufficient documentation

## 2018-03-11 DIAGNOSIS — E785 Hyperlipidemia, unspecified: Secondary | ICD-10-CM | POA: Insufficient documentation

## 2018-03-11 LAB — CUP PACEART INCLINIC DEVICE CHECK
Battery Remaining Longevity: 183 mo
Battery Voltage: 3.18 V
Brady Statistic AP VS Percent: 2.44 %
Brady Statistic AS VS Percent: 97.36 %
Brady Statistic RA Percent Paced: 2.43 %
Brady Statistic RV Percent Paced: 0.2 %
Implantable Lead Implant Date: 20190614
Implantable Lead Implant Date: 20190614
Implantable Lead Location: 753859
Implantable Lead Model: 5076
Lead Channel Impedance Value: 266 Ohm
Lead Channel Impedance Value: 532 Ohm
Lead Channel Pacing Threshold Amplitude: 0.5 V
Lead Channel Pacing Threshold Pulse Width: 0.8 ms
Lead Channel Sensing Intrinsic Amplitude: 17.75 mV
Lead Channel Sensing Intrinsic Amplitude: 2.5 mV
Lead Channel Setting Pacing Amplitude: 2 V
MDC IDC LEAD LOCATION: 753860
MDC IDC MSMT LEADCHNL RA PACING THRESHOLD AMPLITUDE: 1 V
MDC IDC MSMT LEADCHNL RV IMPEDANCE VALUE: 380 Ohm
MDC IDC MSMT LEADCHNL RV IMPEDANCE VALUE: 627 Ohm
MDC IDC MSMT LEADCHNL RV PACING THRESHOLD PULSEWIDTH: 0.4 ms
MDC IDC PG IMPLANT DT: 20190614
MDC IDC SESS DTM: 20190924115214
MDC IDC SET LEADCHNL RV PACING AMPLITUDE: 2.5 V
MDC IDC SET LEADCHNL RV PACING PULSEWIDTH: 0.4 ms
MDC IDC SET LEADCHNL RV SENSING SENSITIVITY: 0.9 mV
MDC IDC STAT BRADY AP VP PERCENT: 0.01 %
MDC IDC STAT BRADY AS VP PERCENT: 0.2 %

## 2018-03-11 NOTE — Patient Instructions (Signed)
Medication Instructions:  Your physician recommends that you continue on your current medications as directed. Please refer to the Current Medication list given to you today.  *If you need a refill on your cardiac medications before your next appointment, please call your pharmacy*  Labwork: None ordered  Testing/Procedures: None ordered  Follow-Up: Remote monitoring is used to monitor your Pacemaker or ICD from home. This monitoring reduces the number of office visits required to check your device to one time per year. It allows [place] to keep an eye on the functioning of your device to ensure it is working properly. You are scheduled for a device check from home on 06/10/2018. You may send your transmission at any time that day. If you have a wireless device, the transmission will be sent automatically. After your physician reviews your transmission, you will receive a postcard with your next transmission date.  Your physician wants you to follow-up in: 9 months with Dr. Curt Bears.  You will receive a reminder letter in the mail two months in advance. If you don't receive a letter, please call our office to schedule the follow-up appointment.  Thank you for choosing [facility]!!   [name], RN [phone]

## 2018-03-11 NOTE — Progress Notes (Signed)
Electrophysiology Office Note   Date:  03/11/2018   ID:  Karen, Gomez 1945-12-20, MRN [id number]  PCP:  Harlan Stains, MD Primary Electrophysiologist:  Aianna Fahs Meredith Leeds, MD    No chief complaint on file.    History of Present Illness: Karen Gomez is a 72 y.o. female who presents today for electrophysiology evaluation.   She has a history of hypertension.   She has had multiple episodes of syncope.  She was waiting in the line to see Gaylyn Rong at this time.  She had been waiting in line for approximately 20 minutes prior to the episode.  Per family, she slumped down and sustained no trauma.  She was unconscious for 5-10 seconds.  No seizure-like activity, loss of bowel bladder function or tongue biting.  She was not disoriented when she awoke.  A few days later, she had multiple episodes of syncope.  The first occurred when she was standing for multiple hours while making with her grandchildren.  She had 2 other syncopal episodes while standing.  She had one while sitting.  Prior to whole of her episodes, she had weakness, and dizziness.  She felt well after all of her episodes.  Today, denies symptoms of palpitations, chest pain, shortness of breath, orthopnea, PND, lower extremity edema, claudication, dizziness, presyncope, syncope, bleeding, or neurologic sequela. The patient is tolerating medications without difficulties.  Overall she is doing well.  She has not had further episodes of syncope.  Approximately 2 weeks after the pacemaker was implanted she started to walk a mile and a half.  She feels better with more energy.  She has noted a few times where she has a skipped beat and she feels like she is going to pass out.  She has had no episodes of syncope.   Past Medical History:  Diagnosis Date  . Anxiety    " husband passed unexpectedly in 2013" (11/29/2017)  . Arthritis    "right knee, hands, cervical & lumbar" (11/29/2017)  . DDD (degenerative disc disease),  cervical   . DDD (degenerative disc disease), lumbar   . Degenerative scoliosis in adult patient   . GERD (gastroesophageal reflux disease)   . History of loop recorder- removed 11/29/17 11/30/2017  . Hypertension   . Nerve pain    CYST IN LOWER LUMBAR SPINE  . Pneumonia 1990s X 1  . PONV (postoperative nausea and vomiting)    WHEN SHE HAD NECK SURGERY ?2002, HAD SOME N/V, NONE WITH HER OTHER SURGERIES  . Presence of permanent cardiac pacemaker 11/29/2017  . RBBB   . S/P placement of cardiac pacemaker 11/29/17 MDT 11/30/2017   Past Surgical History:  Procedure Laterality Date  . ANTERIOR CERVICAL DECOMP/DISCECTOMY FUSION  ~ 2002   PLACEMENT OF TITANIUM PLATE  . BACK SURGERY    . CATARACT EXTRACTION W/ INTRAOCULAR LENS  IMPLANT, BILATERAL    . CESAREAN SECTION  1973; 1977  . DILATION AND CURETTAGE OF UTERUS     PART OF UTERINE POLYP REMOVAL  . INSERT / REPLACE / REMOVE PACEMAKER  11/29/2017  . LOOP RECORDER INSERTION N/A 07/09/2017   Procedure: LOOP RECORDER INSERTION;  Surgeon: Constance Haw, MD;  Location: [place] [facility];  Service: Cardiovascular;  Laterality: N/A;  . LOOP RECORDER REMOVAL  11/29/2017  . LOOP RECORDER REMOVAL N/A 11/29/2017   Procedure: LOOP RECORDER REMOVAL;  Surgeon: Constance Haw, MD;  Location: [place] [facility];  Service: Cardiovascular;  Laterality: N/A;  . PACEMAKER  IMPLANT N/A 11/29/2017   Procedure: PACEMAKER IMPLANT;  Surgeon: Constance Haw, MD;  Location: [place] [facility];  Service: Cardiovascular;  Laterality: N/A;  . REVERSE SHOULDER ARTHROPLASTY Right 06/28/2016   Procedure: REVERSE SHOULDER ARTHROPLASTY;  Surgeon: Justice Britain, MD;  Location: [place];  Service: Orthopedics;  Laterality: Right;  . TONSILLECTOMY  1940s  . UTERINE POLYPS       Current Outpatient Medications  Medication Sig Dispense Refill  . calcium-vitamin D (OSCAL WITH D) 500-200 MG-UNIT tablet Take 1 tablet by mouth daily.    . cholecalciferol (VITAMIN D)  1000 units tablet Take 1,000 Units by mouth daily.    Marland Kitchen escitalopram (LEXAPRO) 5 MG tablet Take 5 mg by mouth daily.    Marland Kitchen estradiol (ESTRACE) 0.5 MG tablet Take 0.5 mg by mouth daily.    Marland Kitchen gabapentin (NEURONTIN) 300 MG capsule Take 300 mg by mouth at bedtime.     Marland Kitchen HYDROcodone-acetaminophen (NORCO) 10-325 MG tablet Take 0.5 tablets by mouth daily as needed for moderate pain.    Marland Kitchen lisinopril (PRINIVIL,ZESTRIL) 10 MG tablet Take 10 mg by mouth daily.    . Magnesium 250 MG TABS Take 250 mg by mouth daily.    . Multiple Vitamin (MULTIVITAMIN WITH MINERALS) TABS tablet Take 1 tablet by mouth daily. Centrum Silver    . progesterone (PROMETRIUM) 100 MG capsule Take 100 mg by mouth at bedtime.      No current facility-administered medications for this visit.     Allergies:   Chocolate and Penicillins   Social History:  The patient  reports that she has never smoked. She has never used smokeless tobacco. She reports that she drinks about 14.0 standard drinks of alcohol per week. She reports that she does not use drugs.   Family History:  The patient's family history includes Dementia in her mother; Heart disease in her brother and father.   ROS:  Please see the history of present illness.   Otherwise, review of systems is positive for none.   All other systems are reviewed and negative.   PHYSICAL EXAM: VS:  BP 124/66   Pulse (!) 59   Ht 5\' 3"  (1.6 m)   Wt 115 lb (52.2 kg)   SpO2 97%   BMI 20.37 kg/m  , BMI Body mass index is 20.37 kg/m. GEN: Well nourished, well developed, in no acute distress  HEENT: normal  Neck: no JVD, carotid bruits, or masses Cardiac: RRR; no murmurs, rubs, or gallops,no edema  Respiratory:  clear to auscultation bilaterally, normal work of breathing GI: soft, nontender, nondistended, + BS MS: no deformity or atrophy  Skin: warm and dry, device site well healed Neuro:  Strength and sensation are intact Psych: euthymic mood, full affect  EKG:  EKG is ordered  today. Personal review of the ekg ordered shows this rhythm, right bundle branch block, left anterior fascicular block, rate 59  Personal review of the device interrogation today. Results in [place]: 06/08/2017: TSH 3.230 11/29/2017: BUN 7; Creatinine, Ser 0.67; Hemoglobin 14.0; Platelets 267; Potassium 3.5; Sodium 138    Lipid Panel  No results found for: CHOL, TRIG, HDL, CHOLHDL, VLDL, LDLCALC, LDLDIRECT   Wt Readings from Last 3 Encounters:  03/11/18 115 lb (52.2 kg)  11/30/17 116 lb 6.4 oz (52.8 kg)  10/09/17 117 lb 3.2 oz (53.2 kg)      Other studies Reviewed: Additional studies/ records that were reviewed today include: TTE 06/07/16, 30 day monitor 05/24/16  Review of the above records today demonstrates:  - LVEF 60-65%, normal wall thickness, normal wall motion, diastolic   dysfunction, normal LV filling pressure, normal LA size, normal   IVC.  Sinus rhythm with sinus tachycardia.   ASSESSMENT AND PLAN:  1.  Syncope with complete heart block: Holter 06/08/18 implanted showed intermittent complete AV block, [employer] pacemaker implanted 11/29/17.  He is overall feeling well without further episodes of syncope.  At this point, no changes.  2.  Right bundle branch block, left anterior fascicular block: Is chronic.  Consistent on her EKG today.  We Jamir Rone continue with current management.  Pacemaker implanted.  Current medicines are reviewed at length with the patient today.   The patient does not have concerns regarding her medicines.  The following changes were made today: None  Labs/ tests ordered today include:  Orders Placed This Encounter  Procedures  . EKG 12-Lead     Disposition:   FU with Jeffrey Voth 9 months  Signed, Baeleigh Devincent Meredith Leeds, MD  03/11/2018 9:55 AM     [facility] [address] [place] [address] [phone] (office) [phone] (fa

## 2018-03-25 ENCOUNTER — Emergency Department (HOSPITAL_COMMUNITY): Payer: Medicare PPO

## 2018-03-25 ENCOUNTER — Inpatient Hospital Stay (HOSPITAL_COMMUNITY)
Admission: EM | Admit: 2018-03-25 | Discharge: 2018-03-28 | DRG: 281 | Disposition: A | Discharge status: Home / Self Care | Payer: Medicare PPO | Attending: Cardiology | Admitting: Cardiology

## 2018-03-25 ENCOUNTER — Encounter (HOSPITAL_COMMUNITY): Payer: Self-pay | Admitting: Emergency Medicine

## 2018-03-25 ENCOUNTER — Other Ambulatory Visit: Payer: Self-pay

## 2018-03-25 DIAGNOSIS — I5181 Takotsubo syndrome: Secondary | ICD-10-CM

## 2018-03-25 DIAGNOSIS — Z96611 Presence of right artificial shoulder joint: Secondary | ICD-10-CM | POA: Diagnosis present

## 2018-03-25 DIAGNOSIS — R931 Abnormal findings on diagnostic imaging of heart and coronary circulation: Secondary | ICD-10-CM | POA: Diagnosis present

## 2018-03-25 DIAGNOSIS — F419 Anxiety disorder, unspecified: Secondary | ICD-10-CM | POA: Diagnosis present

## 2018-03-25 DIAGNOSIS — F10129 Alcohol abuse with intoxication, unspecified: Secondary | ICD-10-CM | POA: Diagnosis present

## 2018-03-25 DIAGNOSIS — Z880 Allergy status to penicillin: Secondary | ICD-10-CM

## 2018-03-25 DIAGNOSIS — R7989 Other specified abnormal findings of blood chemistry: Secondary | ICD-10-CM

## 2018-03-25 DIAGNOSIS — I214 Non-ST elevation (NSTEMI) myocardial infarction: Secondary | ICD-10-CM | POA: Diagnosis present

## 2018-03-25 DIAGNOSIS — S0990XA Unspecified injury of head, initial encounter: Secondary | ICD-10-CM

## 2018-03-25 DIAGNOSIS — S0240CA Maxillary fracture, right side, initial encounter for closed fracture: Secondary | ICD-10-CM

## 2018-03-25 DIAGNOSIS — R55 Syncope and collapse: Secondary | ICD-10-CM | POA: Diagnosis present

## 2018-03-25 DIAGNOSIS — Y906 Blood alcohol level of 120-199 mg/100 ml: Secondary | ICD-10-CM | POA: Diagnosis present

## 2018-03-25 DIAGNOSIS — Z79890 Hormone replacement therapy (postmenopausal): Secondary | ICD-10-CM

## 2018-03-25 DIAGNOSIS — I519 Heart disease, unspecified: Secondary | ICD-10-CM | POA: Diagnosis present

## 2018-03-25 DIAGNOSIS — R402363 Coma scale, best motor response, obeys commands, at hospital admission: Secondary | ICD-10-CM | POA: Diagnosis present

## 2018-03-25 DIAGNOSIS — I10 Essential (primary) hypertension: Secondary | ICD-10-CM | POA: Diagnosis present

## 2018-03-25 DIAGNOSIS — R402143 Coma scale, eyes open, spontaneous, at hospital admission: Secondary | ICD-10-CM | POA: Diagnosis present

## 2018-03-25 DIAGNOSIS — I452 Bifascicular block: Secondary | ICD-10-CM | POA: Diagnosis present

## 2018-03-25 DIAGNOSIS — Z981 Arthrodesis status: Secondary | ICD-10-CM

## 2018-03-25 DIAGNOSIS — Z79899 Other long term (current) drug therapy: Secondary | ICD-10-CM

## 2018-03-25 DIAGNOSIS — Z950 Presence of cardiac pacemaker: Secondary | ICD-10-CM

## 2018-03-25 DIAGNOSIS — Z91018 Allergy to other foods: Secondary | ICD-10-CM

## 2018-03-25 DIAGNOSIS — R778 Other specified abnormalities of plasma proteins: Secondary | ICD-10-CM

## 2018-03-25 DIAGNOSIS — S0083XA Contusion of other part of head, initial encounter: Secondary | ICD-10-CM | POA: Diagnosis present

## 2018-03-25 DIAGNOSIS — W19XXXA Unspecified fall, initial encounter: Secondary | ICD-10-CM | POA: Diagnosis present

## 2018-03-25 DIAGNOSIS — R402253 Coma scale, best verbal response, oriented, at hospital admission: Secondary | ICD-10-CM | POA: Diagnosis present

## 2018-03-25 LAB — COMPREHENSIVE METABOLIC PANEL
ALBUMIN: 3.7 g/dL (ref 3.5–5.0)
ALK PHOS: 64 U/L (ref 38–126)
ALT: 31 U/L (ref 0–44)
ANION GAP: 7 (ref 5–15)
AST: 28 U/L (ref 15–41)
BILIRUBIN TOTAL: 0.3 mg/dL (ref 0.3–1.2)
BUN: 10 mg/dL (ref 8–23)
CALCIUM: 8.7 mg/dL — AB (ref 8.9–10.3)
CO2: 23 mmol/L (ref 22–32)
Chloride: 104 mmol/L (ref 98–111)
Creatinine, Ser: 0.49 mg/dL (ref 0.44–1.00)
GFR calc Af Amer: >60 mL/min (ref >60)
GLUCOSE: 93 mg/dL (ref 70–99)
Potassium: 3.6 mmol/L (ref 3.5–5.1)
Sodium: 134 mmol/L — ABNORMAL LOW (ref 135–145)
Total Protein: 6 g/dL — ABNORMAL LOW (ref 6.5–8.1)

## 2018-03-25 LAB — CBG MONITORING, ED: GLUCOSE-CAPILLARY: 93 mg/dL (ref 70–99)

## 2018-03-25 LAB — CBC
HCT: 42.5 % (ref 36.0–46.0)
Hemoglobin: 13.9 g/dL (ref 12.0–15.0)
MCH: 33.5 pg (ref 26.0–34.0)
MCHC: 32.7 g/dL (ref 30.0–36.0)
MCV: 102.4 fL — ABNORMAL HIGH (ref 80.0–100.0)
PLATELETS: 211 10*3/uL (ref 150–400)
RBC: 4.15 MIL/uL (ref 3.87–5.11)
RDW: 12 % (ref 11.5–15.5)
WBC: 6.4 10*3/uL (ref 4.0–10.5)
nRBC: 0 % (ref 0.0–0.2)

## 2018-03-25 LAB — I-STAT TROPONIN, ED: TROPONIN I, POC: 0.05 ng/mL (ref 0.00–0.08)

## 2018-03-25 MED ORDER — SODIUM CHLORIDE 0.9 % IV BOLUS
500.0000 mL | Freq: Once | INTRAVENOUS | Status: AC
  Administered 2018-03-26: 500 mL via INTRAVENOUS

## 2018-03-25 NOTE — ED Notes (Signed)
Patient transported to CT 

## 2018-03-25 NOTE — ED Notes (Signed)
Pacemaker interrogated, reports was that there were no problems, abnormalities or changes.  Provider notified

## 2018-03-25 NOTE — ED Provider Notes (Signed)
[facility] EMERGENCY DEPARTMENT Provider Note  CSN: [id number] Arrival date & time: 03/25/18 2219  Chief Complaint(s) Fall and Altered Mental Status  HPI Karen Gomez is a 72 y.o. female with extensive past medical history listed below including complete heart block status post pacemaker placement 2 months ago, daily alcohol drinker who presents to the emergency department for fall and altered mental status.  Patient reports that she was in her normal state of health, and actually states that she has been feeling a lot better since her pacemaker was inserted.  She reports that around 7-8 o'clock this evening, she had a fall of unknown etiology (as she cannot remember circumstances surrounding it).  At approximately 9:15 PM the patient called her son stating that she had fallen and that she needed help.  When the son arrived he found her in a chair and noted that she had right facial swelling with bleeding.  Patient was confused at that time and disoriented.  EMS was called who brought the patient in.  She was hemodynamically stable in route.  Patient reported that she typically drinks 2 glasses of wine per night.  Cannot remember if she had more to drink this evening.  She sustained trauma to the face.  But denies any pain at this time.  She denies any headache, facial pain, neck pain, back pain, extremity pain, chest pain, abdominal pain, hip pain.  HPI  Past Medical History Past Medical History:  Diagnosis Date  . Anxiety    " husband passed unexpectedly in 2013" (11/29/2017)  . Arthritis    "right knee, hands, cervical & lumbar" (11/29/2017)  . DDD (degenerative disc disease), cervical   . DDD (degenerative disc disease), lumbar   . Degenerative scoliosis in adult patient   . GERD (gastroesophageal reflux disease)   . History of loop recorder- removed 11/29/17 11/30/2017  . Hypertension   . Nerve pain    CYST IN LOWER LUMBAR SPINE  . Pneumonia 1990s X 1  . PONV  (postoperative nausea and vomiting)    WHEN SHE HAD NECK SURGERY ?2002, HAD SOME N/V, NONE WITH HER OTHER SURGERIES  . Presence of permanent cardiac pacemaker 11/29/2017  . RBBB   . S/P placement of cardiac pacemaker 11/29/17 MDT 11/30/2017   Patient Active Problem List   Diagnosis Date Noted  . Complete tear of right rotator cuff 03/11/2018  . Dyslipidemia 03/11/2018  . Gastroesophageal reflux disease without esophagitis 03/11/2018  . LPRD (laryngopharyngeal reflux disease) 03/11/2018  . Spinal stenosis of lumbar region 03/11/2018  . S/P placement of cardiac pacemaker 11/29/17 MDT 11/30/2017  . History of loop recorder- removed 11/29/17 11/30/2017  . CHB (complete heart block) ([place]) 11/29/2017  . Lumbar spondylosis 10/14/2017  . Cervical spondylosis 08/15/2017  . Long-term current use of opiate analgesic 07/29/2017  . Syncopal episodes 06/08/2017  . Essential hypertension   . S/P reverse total shoulder arthroplasty, right 06/28/2016   Home Medication(s) Prior to Admission medications   Medication Sig Start Date End Date Taking? Authorizing Provider  calcium-vitamin D (OSCAL WITH D) 500-200 MG-UNIT tablet Take 1 tablet by mouth daily.   Yes [provider]  cholecalciferol (VITAMIN D) 1000 units tablet Take 1,000 Units by mouth daily.   Yes [provider]  escitalopram (LEXAPRO) 5 MG tablet Take 5 mg by mouth daily.   Yes [provider]  estradiol (ESTRACE) 0.5 MG tablet Take 0.5 mg by mouth daily.   Yes [provider]  gabapentin (NEURONTIN)  300 MG capsule Take 300 mg by mouth at bedtime.  03/10/16  Yes [provider]  HYDROcodone-acetaminophen (NORCO) 10-325 MG tablet Take 0.5 tablets by mouth daily as needed for moderate pain.   Yes [provider]  lisinopril (PRINIVIL,ZESTRIL) 10 MG tablet Take 10 mg by mouth daily.   Yes [provider]  Magnesium 250 MG TABS Take 250 mg by mouth daily.   Yes [provider]   Multiple Vitamin (MULTIVITAMIN WITH MINERALS) TABS tablet Take 1 tablet by mouth daily. Centrum Silver   Yes [provider]  progesterone (PROMETRIUM) 100 MG capsule Take 100 mg by mouth at bedtime.    Yes [provider]                                                                                                                                    Past Surgical History Past Surgical History:  Procedure Laterality Date  . ANTERIOR CERVICAL DECOMP/DISCECTOMY FUSION  ~ 2002   PLACEMENT OF TITANIUM PLATE  . BACK SURGERY    . CATARACT EXTRACTION W/ INTRAOCULAR LENS  IMPLANT, BILATERAL    . CESAREAN SECTION  1973; 1977  . DILATION AND CURETTAGE OF UTERUS     PART OF UTERINE POLYP REMOVAL  . INSERT / REPLACE / REMOVE PACEMAKER  11/29/2017  . LOOP RECORDER INSERTION N/A 07/09/2017   Procedure: LOOP RECORDER INSERTION;  Surgeon: Constance Haw, MD;  Location: [facility];  Service: Cardiovascular;  Laterality: N/A;  . LOOP RECORDER REMOVAL  11/29/2017  . LOOP RECORDER REMOVAL N/A 11/29/2017   Procedure: LOOP RECORDER REMOVAL;  Surgeon: Constance Haw, MD;  Location: [facility];  Service: Cardiovascular;  Laterality: N/A;  . PACEMAKER IMPLANT N/A 11/29/2017   Procedure: PACEMAKER IMPLANT;  Surgeon: Constance Haw, MD;  Location: [facility];  Service: Cardiovascular;  Laterality: N/A;  . REVERSE SHOULDER ARTHROPLASTY Right 06/28/2016   Procedure: REVERSE SHOULDER ARTHROPLASTY;  Surgeon: Justice Britain, MD;  Location: [facility];  Service: Orthopedics;  Laterality: Right;  . TONSILLECTOMY  1940s  . UTERINE POLYPS     Family History Family History  Problem Relation Age of Onset  . Dementia Mother   . Heart disease Father   . Heart disease Brother     Social History Social History   Tobacco Use  . Smoking status: Never Smoker  . Smokeless tobacco: Never Used  Substance Use Topics  . Alcohol use: Yes    Alcohol/week: 14.0 standard  drinks    Types: 14 Glasses of wine per week  . Drug use: Never   Allergies Chocolate and Penicillins  Review of Systems Review of Systems All other systems are reviewed and are negative for acute change except as noted in the HPI  Physical Exam Vital Signs  I have reviewed the triage vital signs BP 113/67   Pulse 74   Temp 97.8 F (  36.6 C) (Oral)   Resp 15   Ht 5' 2.5" (1.588 m)   Wt 52.2 kg   SpO2 99%   BMI 20.70 kg/m   Physical Exam  Constitutional: She is oriented to person, place, and time. She appears well-developed and well-nourished. No distress.  HENT:  Head: Normocephalic. Head is with abrasion and with contusion. Head is without laceration.    Right Ear: External ear normal.  Left Ear: External ear normal.  Nose: Nose normal.  Eyes: Pupils are equal, round, and reactive to light. Conjunctivae and EOM are normal. Right eye exhibits no discharge. Left eye exhibits no discharge. No scleral icterus.  Neck: Normal range of motion. Neck supple. No spinous process tenderness and no muscular tenderness present. Normal range of motion present.  Cardiovascular: Normal rate, regular rhythm and normal heart sounds. Exam reveals no gallop and no friction rub.  No murmur heard. Pulses:      Radial pulses are 2+ on the right side, and 2+ on the left side.       Dorsalis pedis pulses are 2+ on the right side, and 2+ on the left side.  Pulmonary/Chest: Effort normal and breath sounds normal. No stridor. No respiratory distress. She has no wheezes.  Abdominal: Soft. She exhibits no distension. There is no tenderness.  Musculoskeletal: She exhibits no edema or tenderness.       Cervical back: She exhibits no bony tenderness.       Thoracic back: She exhibits no bony tenderness.       Lumbar back: She exhibits no bony tenderness.  Clavicles stable. Chest stable to AP/Lat compression. Pelvis stable to Lat compression. No obvious extremity deformity. No chest or abdominal wall  contusion.  Neurological: She is alert and oriented to person, place, and time.  Moving all extremities  Skin: Skin is warm and dry. No rash noted. She is not diaphoretic. No erythema.  Psychiatric: She has a normal mood and affect.    ED Results and Treatments Labs (all labs ordered are listed, but only abnormal results are displayed) Labs Reviewed  COMPREHENSIVE METABOLIC PANEL - Abnormal; Notable for the following components:      Result Value   Sodium 134 (*)    Calcium 8.7 (*)    Total Protein 6.0 (*)    All other components within normal limits  CBC - Abnormal; Notable for the following components:   MCV 102.4 (*)    All other components within normal limits  ETHANOL - Abnormal; Notable for the following components:   Alcohol, Ethyl (B) 157 (*)    All other components within normal limits  I-STAT TROPONIN, ED - Abnormal; Notable for the following components:   Troponin i, poc 0.38 (*)    All other components within normal limits  MAGNESIUM  CK  URINALYSIS, ROUTINE W REFLEX MICROSCOPIC  RAPID URINE DRUG SCREEN, HOSP PERFORMED  CBG MONITORING, ED  CBG MONITORING, ED  I-STAT TROPONIN, ED  EKG  EKG Interpretation  Date/Time:  Tuesday March 25 2018 22:27:39 EDT Ventricular Rate:  68 PR Interval:    QRS Duration: 152 QT Interval:  423 QTC Calculation: 450 R Axis:   -75 Text Interpretation:  Sinus rhythm RBBB and LAFB Probable lateral infarct, old No significant change since last tracing Reconfirmed by Addison Lank [phone]) on 03/26/2018 2:21:53 AM      Radiology Ct Head Wo Contrast  Result Date: 03/26/2018 CLINICAL DATA:  Confusion after a fall. Swelling to the left lip, I, and skin tear to the forearm. Not on blood thinners. EXAM: CT HEAD WITHOUT CONTRAST CT MAXILLOFACIAL WITHOUT CONTRAST CT CERVICAL SPINE WITHOUT CONTRAST TECHNIQUE:  Multidetector CT imaging of the head, cervical spine, and maxillofacial structures were performed using the standard protocol without intravenous contrast. Multiplanar CT image reconstructions of the cervical spine and maxillofacial structures were also generated. COMPARISON:  CT head 06/08/2017. MRI cervical spine 10/07/2014. FINDINGS: CT HEAD FINDINGS Brain: No evidence of acute infarction, hemorrhage, hydrocephalus, extra-axial collection or mass lesion/mass effect. Mild diffuse cerebral atrophy. Low-attenuation changes in the deep white matter consistent small vessel ischemia. Vascular: Mild intracranial vascular calcifications. Skull: Calvarium appears intact. Other: No significant changes since previous study. CT MAXILLOFACIAL FINDINGS Osseous: Acute depressed fracture of the anterior right maxillary antral wall extending to the inferolateral wall. The orbital rims appear intact. No additional facial fractures are identified. Degenerative changes in the mandibular heads. Orbits: Globes and extraocular muscles appear intact and symmetrical. Sinuses: Opacification and air-fluid level in the right maxillary antrum with increased density consistent with hemorrhage. Paranasal sinuses and mastoid air cells are otherwise clear. Soft tissues: Right periorbital soft tissue hematoma. No retrobulbar involvement. Soft tissue hematoma extends also over the right side of the facial bones and right mandible. CT CERVICAL SPINE FINDINGS Alignment: Straightening of the usual cervical lordosis with slight anterior subluxation of C3 on C4 and C4 on C5 as well as retrolisthesis of C5 on C6. Alignment is unchanged since previous study and is likely degenerative or postoperative. Normal alignment of the facet joints. Skull base and vertebrae: Skull base appears intact. No vertebral compression deformities. Postoperative changes with anterior plate and screw fixation and intervertebral fusion at C6-7. No destructive or expansile bone  lesions. Soft tissues and spinal canal: No prevertebral soft tissue swelling. No abnormal paraspinal soft tissue mass or infiltration. Disc levels: Degenerative narrowing and endplate hypertrophic changes at C5-6. Degenerative changes in the facet joints. Upper chest: Lung apices are clear. Other: None. IMPRESSION: 1. CT HEAD: No acute intracranial abnormalities. Stable atrophy and small vessel ischemic changes. 2. CT maxillofacial: Acute depressed fracture of the anterior right maxillary antral wall with extension to the inferolateral wall of the right maxillary sinus. Associated hemorrhage in the right maxillary antrum. Right periorbital and facial soft tissue hematomas. No additional facial fractures identified. 3. CT CERVICAL SPINE: Stable alignment. No acute displaced fractures identified. Electronically Signed   By: Lucienne Capers M.D.   On: 03/26/2018 00:37   Ct Angio Chest Pe W And/or Wo Contrast  Result Date: 03/26/2018 CLINICAL DATA:  Syncope. EXAM: CT ANGIOGRAPHY CHEST WITH CONTRAST TECHNIQUE: Multidetector CT imaging of the chest was performed using the standard protocol during bolus administration of intravenous contrast. Multiplanar CT image reconstructions and MIPs were obtained to evaluate the vascular anatomy. CONTRAST:  118mL ISOVUE-370 IOPAMIDOL (ISOVUE-370) INJECTION 76% COMPARISON:  None. FINDINGS: Cardiovascular: --Pulmonary arteries: Contrast injection is sufficient to demonstrate satisfactory opacification of the pulmonary arteries to the segmental level. There is  no pulmonary embolus. The main pulmonary artery is within normal limits for size. --Aorta: Limited opacification of the aorta due to bolus timing optimization for the pulmonary arteries. Conventional 3 vessel aortic branching pattern. The aortic course and caliber are normal. There is no aortic atherosclerosis. --Heart: Normal size. No pericardial effusion. Left chest wall pacemaker leads end in the right atrium and right  ventricle. Mediastinum/Nodes: No mediastinal, hilar or axillary lymphadenopathy. The visualized thyroid and thoracic esophageal course are unremarkable. Lungs/Pleura: No pulmonary nodules or masses. No pleural effusion or pneumothorax. No focal airspace consolidation. No focal pleural abnormality. Upper Abdomen: Contrast bolus timing is not optimized for evaluation of the abdominal organs. Within this limitation, the visualized organs of the upper abdomen are normal. Musculoskeletal: Pectus excavatum. Review of the MIP images confirms the above findings. IMPRESSION: No pulmonary embolus or other acute thoracic abnormality. Electronically Signed   By: Ulyses Jarred M.D.   On: 03/26/2018 04:23   Ct Cervical Spine Wo Contrast  Result Date: 03/26/2018 CLINICAL DATA:  Confusion after a fall. Swelling to the left lip, I, and skin tear to the forearm. Not on blood thinners. EXAM: CT HEAD WITHOUT CONTRAST CT MAXILLOFACIAL WITHOUT CONTRAST CT CERVICAL SPINE WITHOUT CONTRAST TECHNIQUE: Multidetector CT imaging of the head, cervical spine, and maxillofacial structures were performed using the standard protocol without intravenous contrast. Multiplanar CT image reconstructions of the cervical spine and maxillofacial structures were also generated. COMPARISON:  CT head 06/08/2017. MRI cervical spine 10/07/2014. FINDINGS: CT HEAD FINDINGS Brain: No evidence of acute infarction, hemorrhage, hydrocephalus, extra-axial collection or mass lesion/mass effect. Mild diffuse cerebral atrophy. Low-attenuation changes in the deep white matter consistent small vessel ischemia. Vascular: Mild intracranial vascular calcifications. Skull: Calvarium appears intact. Other: No significant changes since previous study. CT MAXILLOFACIAL FINDINGS Osseous: Acute depressed fracture of the anterior right maxillary antral wall extending to the inferolateral wall. The orbital rims appear intact. No additional facial fractures are identified.  Degenerative changes in the mandibular heads. Orbits: Globes and extraocular muscles appear intact and symmetrical. Sinuses: Opacification and air-fluid level in the right maxillary antrum with increased density consistent with hemorrhage. Paranasal sinuses and mastoid air cells are otherwise clear. Soft tissues: Right periorbital soft tissue hematoma. No retrobulbar involvement. Soft tissue hematoma extends also over the right side of the facial bones and right mandible. CT CERVICAL SPINE FINDINGS Alignment: Straightening of the usual cervical lordosis with slight anterior subluxation of C3 on C4 and C4 on C5 as well as retrolisthesis of C5 on C6. Alignment is unchanged since previous study and is likely degenerative or postoperative. Normal alignment of the facet joints. Skull base and vertebrae: Skull base appears intact. No vertebral compression deformities. Postoperative changes with anterior plate and screw fixation and intervertebral fusion at C6-7. No destructive or expansile bone lesions. Soft tissues and spinal canal: No prevertebral soft tissue swelling. No abnormal paraspinal soft tissue mass or infiltration. Disc levels: Degenerative narrowing and endplate hypertrophic changes at C5-6. Degenerative changes in the facet joints. Upper chest: Lung apices are clear. Other: None. IMPRESSION: 1. CT HEAD: No acute intracranial abnormalities. Stable atrophy and small vessel ischemic changes. 2. CT maxillofacial: Acute depressed fracture of the anterior right maxillary antral wall with extension to the inferolateral wall of the right maxillary sinus. Associated hemorrhage in the right maxillary antrum. Right periorbital and facial soft tissue hematomas. No additional facial fractures identified. 3. CT CERVICAL SPINE: Stable alignment. No acute displaced fractures identified. Electronically Signed   By: Oren Beckmann.D.  On: 03/26/2018 00:37   Ct Maxillofacial Wo Cm  Result Date: 03/26/2018 CLINICAL  DATA:  Confusion after a fall. Swelling to the left lip, I, and skin tear to the forearm. Not on blood thinners. EXAM: CT HEAD WITHOUT CONTRAST CT MAXILLOFACIAL WITHOUT CONTRAST CT CERVICAL SPINE WITHOUT CONTRAST TECHNIQUE: Multidetector CT imaging of the head, cervical spine, and maxillofacial structures were performed using the standard protocol without intravenous contrast. Multiplanar CT image reconstructions of the cervical spine and maxillofacial structures were also generated. COMPARISON:  CT head 06/08/2017. MRI cervical spine 10/07/2014. FINDINGS: CT HEAD FINDINGS Brain: No evidence of acute infarction, hemorrhage, hydrocephalus, extra-axial collection or mass lesion/mass effect. Mild diffuse cerebral atrophy. Low-attenuation changes in the deep white matter consistent small vessel ischemia. Vascular: Mild intracranial vascular calcifications. Skull: Calvarium appears intact. Other: No significant changes since previous study. CT MAXILLOFACIAL FINDINGS Osseous: Acute depressed fracture of the anterior right maxillary antral wall extending to the inferolateral wall. The orbital rims appear intact. No additional facial fractures are identified. Degenerative changes in the mandibular heads. Orbits: Globes and extraocular muscles appear intact and symmetrical. Sinuses: Opacification and air-fluid level in the right maxillary antrum with increased density consistent with hemorrhage. Paranasal sinuses and mastoid air cells are otherwise clear. Soft tissues: Right periorbital soft tissue hematoma. No retrobulbar involvement. Soft tissue hematoma extends also over the right side of the facial bones and right mandible. CT CERVICAL SPINE FINDINGS Alignment: Straightening of the usual cervical lordosis with slight anterior subluxation of C3 on C4 and C4 on C5 as well as retrolisthesis of C5 on C6. Alignment is unchanged since previous study and is likely degenerative or postoperative. Normal alignment of the facet  joints. Skull base and vertebrae: Skull base appears intact. No vertebral compression deformities. Postoperative changes with anterior plate and screw fixation and intervertebral fusion at C6-7. No destructive or expansile bone lesions. Soft tissues and spinal canal: No prevertebral soft tissue swelling. No abnormal paraspinal soft tissue mass or infiltration. Disc levels: Degenerative narrowing and endplate hypertrophic changes at C5-6. Degenerative changes in the facet joints. Upper chest: Lung apices are clear. Other: None. IMPRESSION: 1. CT HEAD: No acute intracranial abnormalities. Stable atrophy and small vessel ischemic changes. 2. CT maxillofacial: Acute depressed fracture of the anterior right maxillary antral wall with extension to the inferolateral wall of the right maxillary sinus. Associated hemorrhage in the right maxillary antrum. Right periorbital and facial soft tissue hematomas. No additional facial fractures identified. 3. CT CERVICAL SPINE: Stable alignment. No acute displaced fractures identified. Electronically Signed   By: Lucienne Capers M.D.   On: 03/26/2018 00:37   Pertinent labs & imaging results that were available during my care of the patient were reviewed by me and considered in my medical decision making (see chart for details).  Medications Ordered in ED Medications  iopamidol (ISOVUE-370) 76 % injection (has no administration in time range)  fentaNYL (SUBLIMAZE) injection 50 mcg (has no administration in time range)  sodium chloride 0.9 % bolus 500 mL (0 mLs Intravenous Stopped 03/26/18 0203)  fentaNYL (SUBLIMAZE) injection 50 mcg (50 mcg Intravenous Given 03/26/18 0201)  clindamycin (CLEOCIN) capsule 300 mg (300 mg Oral Given 03/26/18 0159)  iopamidol (ISOVUE-370) 76 % injection 100 mL (100 mLs Intravenous Contrast Given 03/26/18 0354)  Procedures Procedures   EMERGENCY DEPARTMENT [place] CARDIAC EXAM "Study: Limited Ultrasound of the Heart and Pericardium"  INDICATIONS:Possible syncope Multiple views of the heart and pericardium were obtained in real-time with a multi-frequency probe.  PERFORMED UT:MLYYTK IMAGES ARCHIVED?: Yes LIMITATIONS:  None VIEWS USED: Parasternal long axis, Parasternal short axis and Apical 4 chamber  INTERPRETATION: Cardiac activity present, Pericardial effusioin absent and Normal contractility   (including critical care time)   CRITICAL CARE Performed by: Grayce Sessions Cardama Total critical care time: 65 minutes Critical care time was exclusive of separately billable procedures and treating other patients. Critical care was necessary to treat or prevent imminent or life-threatening deterioration. Critical care was time spent personally by me on the following activities: development of treatment plan with patient and/or surrogate as well as nursing, discussions with consultants, evaluation of patient's response to treatment, examination of patient, obtaining history from patient or surrogate, ordering and performing treatments and interventions, ordering and review of laboratory studies, ordering and review of radiographic studies, pulse oximetry and re-evaluation of patient's condition.   Medical Decision Making / ED Course I have reviewed the nursing notes for this encounter and the patient's prior records (if available in EHR or on provided paperwork).    Patient presents after a fall with facial trauma.  Altered mental status has resolved and was likely due to concussion.  Will obtain CT head, face and cervical spine to assess for any injuries.  No other injuries noted on exam requiring additional imaging.  Fall etiology uncertain.  Pacemaker was interrogated and did not reveal any acute dysrhythmias or bradycardia.  EKG unchanged from prior.  Initial troponin negative. Will obtain  delta.  Orthostatics were reassuring.  Work-up was consistent with alcohol intoxication; not consistent with the patient's history of 2 glasses of wine.   Rest of the work-up is grossly reassuring without leukocytosis or anemia.  No significant electrolyte derangements or renal insufficiency.  CBG within normal limits.  CK within normal limits.  Patient provided with IV fluids.  CT head, face, cervical spine revealed right maxillary sinus fracture without evidence of ICH or other acute injuries.  No sign of instability; likely nonsurgical. Will place her on prophylactic antibiotics and have her follow up with ENT.   Delta trop elevated to 0.38.  Possible NSTEMI.  CTA was obtained ruling out PE.  We will discuss case with cardiology for admission and further management.    Since patient is being admitted, she will require prophylactic clindamycin for at least 5 days.  ENT can be consulted during admission, but she will most likely require outpatient follow-up.  Final Clinical Impression(s) / ED Diagnoses Final diagnoses:  Traumatic injury of head, initial encounter  Closed fracture of right side of maxilla, initial encounter ([place])  Elevated troponin      This chart was dictated using voice recognition software.  Despite best efforts to proofread,  errors can occur which can change the documentation meaning.   Fatima Blank, MD 03/26/18 [phone]

## 2018-03-25 NOTE — ED Triage Notes (Signed)
Pt called son to say she needed help, had fallen.  He found her sitting in a chair a few feet from where there was blood on floor.  Swelling to left lip, eye, skin tear to left forearm.  Pt was confused initially, only knowing self.  Now she is alert and only confused about the situation which she does not remember, does have some repetitive questioning.  Did have a pacemaker placed two months ago. Not on blood thinners.

## 2018-03-26 ENCOUNTER — Observation Stay (HOSPITAL_BASED_OUTPATIENT_CLINIC_OR_DEPARTMENT_OTHER): Payer: Medicare PPO

## 2018-03-26 ENCOUNTER — Emergency Department (HOSPITAL_COMMUNITY): Payer: Medicare PPO

## 2018-03-26 DIAGNOSIS — I214 Non-ST elevation (NSTEMI) myocardial infarction: Secondary | ICD-10-CM

## 2018-03-26 DIAGNOSIS — R55 Syncope and collapse: Secondary | ICD-10-CM

## 2018-03-26 DIAGNOSIS — I5030 Unspecified diastolic (congestive) heart failure: Secondary | ICD-10-CM

## 2018-03-26 DIAGNOSIS — S02401A Maxillary fracture, unspecified, initial encounter for closed fracture: Secondary | ICD-10-CM

## 2018-03-26 LAB — URINALYSIS, ROUTINE W REFLEX MICROSCOPIC
BILIRUBIN URINE: NEGATIVE
Glucose, UA: NEGATIVE mg/dL
Hgb urine dipstick: NEGATIVE
KETONES UR: NEGATIVE mg/dL
LEUKOCYTES UA: NEGATIVE
Nitrite: NEGATIVE
PROTEIN: NEGATIVE mg/dL
Specific Gravity, Urine: 1.031 — ABNORMAL HIGH (ref 1.005–1.030)
pH: 7 (ref 5.0–8.0)

## 2018-03-26 LAB — MAGNESIUM: MAGNESIUM: 2.2 mg/dL (ref 1.7–2.4)

## 2018-03-26 LAB — ECHOCARDIOGRAM COMPLETE
Height: 62.5 in
Weight: 1840 oz

## 2018-03-26 LAB — RAPID URINE DRUG SCREEN, HOSP PERFORMED
Amphetamines: NOT DETECTED
BENZODIAZEPINES: NOT DETECTED
Barbiturates: NOT DETECTED
COCAINE: NOT DETECTED
Opiates: NOT DETECTED
Tetrahydrocannabinol: NOT DETECTED

## 2018-03-26 LAB — ETHANOL: ALCOHOL ETHYL (B): 157 mg/dL — AB (ref <10)

## 2018-03-26 LAB — HEPARIN LEVEL (UNFRACTIONATED): Heparin Unfractionated: 0.28 IU/mL — ABNORMAL LOW (ref 0.30–0.70)

## 2018-03-26 LAB — I-STAT TROPONIN, ED: TROPONIN I, POC: 0.38 ng/mL — AB (ref 0.00–0.08)

## 2018-03-26 LAB — LIPID PANEL
Cholesterol: 179 mg/dL (ref 0–200)
HDL: 66 mg/dL (ref >40)
LDL Cholesterol: 103 mg/dL — ABNORMAL HIGH (ref 0–99)
Total CHOL/HDL Ratio: 2.7 RATIO
Triglycerides: 49 mg/dL (ref <150)
VLDL: 10 mg/dL (ref 0–40)

## 2018-03-26 LAB — TROPONIN I
TROPONIN I: 0.71 ng/mL — AB (ref <0.03)
Troponin I: 0.46 ng/mL (ref <0.03)

## 2018-03-26 LAB — CK: Total CK: 132 U/L (ref 38–234)

## 2018-03-26 MED ORDER — ONDANSETRON HCL 4 MG/2ML IJ SOLN: 4.0000 mg | Freq: Four times a day (QID) | INTRAMUSCULAR | Status: DC | PRN

## 2018-03-26 MED ORDER — NITROGLYCERIN 0.4 MG SL SUBL: 0.4000 mg | SUBLINGUAL_TABLET | SUBLINGUAL | Status: DC | PRN

## 2018-03-26 MED ORDER — IOPAMIDOL (ISOVUE-370) INJECTION 76%
INTRAVENOUS | Status: AC
  Filled 2018-03-26: qty 100

## 2018-03-26 MED ORDER — FENTANYL CITRATE (PF) 100 MCG/2ML IJ SOLN
50.0000 ug | Freq: Once | INTRAMUSCULAR | Status: AC
  Administered 2018-03-26: 50 ug via INTRAVENOUS
  Filled 2018-03-26: qty 2

## 2018-03-26 MED ORDER — HEPARIN (PORCINE) IN NACL 100-0.45 UNIT/ML-% IJ SOLN
750.0000 [IU]/h | INTRAMUSCULAR | Status: DC
  Administered 2018-03-26 (×2): 650 [IU]/h via INTRAVENOUS
  Filled 2018-03-26: qty 250

## 2018-03-26 MED ORDER — ATORVASTATIN CALCIUM 80 MG PO TABS
80.0000 mg | ORAL_TABLET | Freq: Every day | ORAL | Status: DC
  Administered 2018-03-26 – 2018-03-28 (×3): 80 mg via ORAL
  Filled 2018-03-26 (×3): qty 1

## 2018-03-26 MED ORDER — OXYCODONE HCL 5 MG PO TABS
5.0000 mg | ORAL_TABLET | Freq: Four times a day (QID) | ORAL | Status: DC | PRN
  Administered 2018-03-26 – 2018-03-27 (×5): 5 mg via ORAL
  Filled 2018-03-26 (×5): qty 1

## 2018-03-26 MED ORDER — ACETAMINOPHEN 325 MG PO TABS
650.0000 mg | ORAL_TABLET | ORAL | Status: DC | PRN
  Administered 2018-03-26 – 2018-03-27 (×3): 650 mg via ORAL
  Filled 2018-03-26 (×6): qty 2

## 2018-03-26 MED ORDER — SODIUM CHLORIDE 0.9 % WEIGHT BASED INFUSION: 1.0000 mL/kg/h | INTRAVENOUS | Status: DC

## 2018-03-26 MED ORDER — SODIUM CHLORIDE 0.9% FLUSH: 3.0000 mL | INTRAVENOUS | Status: DC | PRN

## 2018-03-26 MED ORDER — ASPIRIN 81 MG PO CHEW
81.0000 mg | CHEWABLE_TABLET | ORAL | Status: AC
  Administered 2018-03-27: 81 mg via ORAL
  Filled 2018-03-26: qty 1

## 2018-03-26 MED ORDER — SODIUM CHLORIDE 0.9 % IV SOLN
250.0000 mL | INTRAVENOUS | Status: DC | PRN
  Administered 2018-03-27: 250 mL via INTRAVENOUS

## 2018-03-26 MED ORDER — ASPIRIN EC 81 MG PO TBEC
81.0000 mg | DELAYED_RELEASE_TABLET | Freq: Every day | ORAL | Status: DC
  Administered 2018-03-26 – 2018-03-28 (×2): 81 mg via ORAL
  Filled 2018-03-26 (×3): qty 1

## 2018-03-26 MED ORDER — SODIUM CHLORIDE 0.9% FLUSH
3.0000 mL | Freq: Two times a day (BID) | INTRAVENOUS | Status: DC
  Administered 2018-03-26: 3 mL via INTRAVENOUS

## 2018-03-26 MED ORDER — IOPAMIDOL (ISOVUE-370) INJECTION 76%
100.0000 mL | Freq: Once | INTRAVENOUS | Status: AC | PRN
  Administered 2018-03-26: 100 mL via INTRAVENOUS

## 2018-03-26 MED ORDER — CLINDAMYCIN HCL 150 MG PO CAPS
300.0000 mg | ORAL_CAPSULE | Freq: Once | ORAL | Status: AC
  Administered 2018-03-26: 300 mg via ORAL
  Filled 2018-03-26: qty 2

## 2018-03-26 MED ORDER — SODIUM CHLORIDE 0.9 % WEIGHT BASED INFUSION
3.0000 mL/kg/h | INTRAVENOUS | Status: DC
  Administered 2018-03-27: 2.989 mL/kg/h via INTRAVENOUS

## 2018-03-26 NOTE — ED Notes (Signed)
Attempted report x1. 

## 2018-03-26 NOTE — Consult Note (Signed)
HPI:  Karen Gomez is an 72 y.o. female who was brought in by EMS last night after an unwitnessed fall/syncope. The patient has extensive past medical history, including complete heart block status post pacemaker placement 2 months ago.  Patient reports that she was in her normal state of health around 7-8 o'clock last night, when she had a fall of unknown etiology (as she cannot remember circumstances surrounding it).  At approximately 9:15 PM the patient called her son stating that she had fallen and that she needed help.  When the son arrived, he found her in a chair and noted that she had right facial swelling with bleeding.  Patient was confused at that time and disoriented.  EMS was called who brought the patient in.  Her CT scan in the ER showed mildly depressed right maxillary fractures.  Past Medical History:  Diagnosis Date  . Anxiety    " husband passed unexpectedly in 2013" (11/29/2017)  . Arthritis    "right knee, hands, cervical & lumbar" (11/29/2017)  . DDD (degenerative disc disease), cervical   . DDD (degenerative disc disease), lumbar   . Degenerative scoliosis in adult patient   . GERD (gastroesophageal reflux disease)   . History of loop recorder- removed 11/29/17 11/30/2017  . Hypertension   . Nerve pain    CYST IN LOWER LUMBAR SPINE  . Pneumonia 1990s X 1  . PONV (postoperative nausea and vomiting)    WHEN SHE HAD NECK SURGERY ?2002, HAD SOME N/V, NONE WITH HER OTHER SURGERIES  . Presence of permanent cardiac pacemaker 11/29/2017  . RBBB   . S/P placement of cardiac pacemaker 11/29/17 MDT 11/30/2017    Past Surgical History:  Procedure Laterality Date  . ANTERIOR CERVICAL DECOMP/DISCECTOMY FUSION  ~ 2002   PLACEMENT OF TITANIUM PLATE  . BACK SURGERY    . CATARACT EXTRACTION W/ INTRAOCULAR LENS  IMPLANT, BILATERAL    . CESAREAN SECTION  1973; 1977  . DILATION AND CURETTAGE OF UTERUS     PART OF UTERINE POLYP REMOVAL  . INSERT / REPLACE / REMOVE PACEMAKER  11/29/2017   . LOOP RECORDER INSERTION N/A 07/09/2017   Procedure: LOOP RECORDER INSERTION;  Surgeon: Constance Haw, MD;  Location: [facility];  Service: Cardiovascular;  Laterality: N/A;  . LOOP RECORDER REMOVAL  11/29/2017  . LOOP RECORDER REMOVAL N/A 11/29/2017   Procedure: LOOP RECORDER REMOVAL;  Surgeon: Constance Haw, MD;  Location: [facility];  Service: Cardiovascular;  Laterality: N/A;  . PACEMAKER IMPLANT N/A 11/29/2017   Procedure: PACEMAKER IMPLANT;  Surgeon: Constance Haw, MD;  Location: [facility];  Service: Cardiovascular;  Laterality: N/A;  . REVERSE SHOULDER ARTHROPLASTY Right 06/28/2016   Procedure: REVERSE SHOULDER ARTHROPLASTY;  Surgeon: Justice Britain, MD;  Location: [facility];  Service: Orthopedics;  Laterality: Right;  . TONSILLECTOMY  1940s  . UTERINE POLYPS      Family History  Problem Relation Age of Onset  . Dementia Mother   . Heart disease Father   . Heart disease Brother     Social History:  reports that she has never smoked. She has never used smokeless tobacco. She reports that she drinks about 14.0 standard drinks of alcohol per week. She reports that she does not use drugs.  Allergies:  Allergies  Allergen Reactions  . Chocolate Swelling  . Penicillins Anaphylaxis    Has patient had a PCN reaction causing immediate rash, facial/tongue/throat swelling, SOB or lightheadedness with hypotension:  Yes Has patient had a PCN reaction causing severe rash involving mucus membranes or skin necrosis: No Has patient had a PCN reaction that required hospitalization: Unknown Has patient had a PCN reaction occurring within the last 10 years: No If all of the above answers are "NO", then may proceed with Cephalosporin use.     Prior to Admission medications   Medication Sig Start Date End Date Taking? Authorizing Provider  calcium-vitamin D (OSCAL WITH D) 500-200 MG-UNIT tablet Take 1 tablet by mouth daily.   Yes [provider]   cholecalciferol (VITAMIN D) 1000 units tablet Take 1,000 Units by mouth daily.   Yes [provider]  escitalopram (LEXAPRO) 5 MG tablet Take 5 mg by mouth daily.   Yes [provider]  estradiol (ESTRACE) 0.5 MG tablet Take 0.5 mg by mouth daily.   Yes [provider]  gabapentin (NEURONTIN) 300 MG capsule Take 300 mg by mouth at bedtime.  03/10/16  Yes [provider]  HYDROcodone-acetaminophen (NORCO) 10-325 MG tablet Take 0.5 tablets by mouth daily as needed for moderate pain.   Yes [provider]  lisinopril (PRINIVIL,ZESTRIL) 10 MG tablet Take 10 mg by mouth daily.   Yes [provider]  Magnesium 250 MG TABS Take 250 mg by mouth daily.   Yes [provider]  Multiple Vitamin (MULTIVITAMIN WITH MINERALS) TABS tablet Take 1 tablet by mouth daily. Centrum Silver   Yes [provider]  progesterone (PROMETRIUM) 100 MG capsule Take 100 mg by mouth at bedtime.    Yes [provider]    Results for orders placed or performed during the hospital encounter of 03/25/18 (from the past 48 hour(s))  CBG monitoring, ED     Status: None   Collection Time: 03/25/18 10:27 PM  Result Value Ref Range   Glucose-Capillary 93 70 - 99 mg/dL   Comment 1 Notify RN   Comprehensive metabolic panel     Status: Abnormal   Collection Time: 03/25/18 10:55 PM  Result Value Ref Range   Sodium 134 (L) 135 - 145 mmol/L   Potassium 3.6 3.5 - 5.1 mmol/L   Chloride 104 98 - 111 mmol/L   CO2 23 22 - 32 mmol/L   Glucose, Bld 93 70 - 99 mg/dL   BUN 10 8 - 23 mg/dL   Creatinine, Ser 0.49 0.44 - 1.00 mg/dL   Calcium 8.7 (L) 8.9 - 10.3 mg/dL   Total Protein 6.0 (L) 6.5 - 8.1 g/dL   Albumin 3.7 3.5 - 5.0 g/dL   AST 28 15 - 41 U/L   ALT 31 0 - 44 U/L   Alkaline Phosphatase 64 38 - 126 U/L   Total Bilirubin 0.3 0.3 - 1.2 mg/dL   GFR calc non Af Amer >60 >60 mL/min   GFR calc Af Amer >60 >60 mL/min    Comment: (NOTE) The eGFR has been  calculated using the CKD EPI equation. This calculation has not been validated in all clinical situations. eGFR's persistently <60 mL/min signify possible Chronic Kidney Disease.    Anion gap 7 5 - 15    Comment: Performed at [facility] [address], [place] [address]  CBC     Status: Abnormal   Collection Time: 03/25/18 10:55 PM  Result Value Ref Range   WBC 6.4 4.0 - 10.5 K/uL   RBC 4.15 3.87 - 5.11 MIL/uL   Hemoglobin 13.9 12.0 - 15.0 g/dL   HCT 42.5 36.0 - 46.0 %  MCV 102.4 (H) 80.0 - 100.0 fL   MCH 33.5 26.0 - 34.0 pg   MCHC 32.7 30.0 - 36.0 g/dL   RDW 12.0 11.5 - 15.5 %   Platelets 211 150 - 400 K/uL   nRBC 0.0 0.0 - 0.2 %    Comment: Performed at [facility] [address], [place], [place] [address]  I-Stat Troponin, ED (not at [facility])     Status: None   Collection Time: 03/25/18 10:59 PM  Result Value Ref Range   Troponin i, poc 0.05 0.00 - 0.08 ng/mL   Comment 3            Comment: Due to the release kinetics of cTnI, a negative result within the first hours of the onset of symptoms does not rule out myocardial infarction with certainty. If myocardial infarction is still suspected, repeat the test at appropriate intervals.   Ethanol     Status: Abnormal   Collection Time: 03/25/18 11:23 PM  Result Value Ref Range   Alcohol, Ethyl (B) 157 (H) <10 mg/dL    Comment: (NOTE) Lowest detectable limit for serum alcohol is 10 mg/dL. For medical purposes only. Performed at [facility], [place] [address], [place], [place] [address]   Magnesium     Status: None   Collection Time: 03/25/18 11:23 PM  Result Value Ref Range   Magnesium 2.2 1.7 - 2.4 mg/dL    Comment: Performed at [facility], [place] [address], [place], [place] [address]  CK     Status: None   Collection Time: 03/25/18 11:23 PM  Result Value Ref Range   Total CK 132 38 - 234 U/L    Comment: Performed at [facility], [place] [address], [place], [place] [address]   I-Stat Troponin, ED (not at [facility])     Status: Abnormal   Collection Time: 03/26/18  2:37 AM  Result Value Ref Range   Troponin i, poc 0.38 (HH) 0.00 - 0.08 ng/mL   Comment NOTIFIED PHYSICIAN    Comment 3            Comment: Due to the release kinetics of cTnI, a negative result within the first hours of the onset of symptoms does not rule out myocardial infarction with certainty. If myocardial infarction is still suspected, repeat the test at appropriate intervals.   Troponin I     Status: Abnormal   Collection Time: 03/26/18  5:50 AM  Result Value Ref Range   Troponin I 0.46 (HH) <0.03 ng/mL    Comment: CRITICAL RESULT CALLED TO, READ BACK BY AND VERIFIED WITH: W HICKS,RN AT 1572 03/26/18 BY L BENFIELD Performed at [facility], [place] [address], [place],  [address]   Lipid panel     Status: Abnormal   Collection Time: 03/26/18  5:50 AM  Result Value Ref Range   Cholesterol 179 0 - 200 mg/dL   Triglycerides 49 <150 mg/dL   HDL 66 >40 mg/dL   Total CHOL/HDL Ratio 2.7 RATIO   VLDL 10 0 - 40 mg/dL   LDL Cholesterol 103 (H) 0 - 99 mg/dL    Comment:        Total Cholesterol/HDL:CHD Risk Coronary Heart Disease Risk Table                     Men   Women  1/2 Average Risk   3.4   3.3  Average Risk  5.0   4.4  2 X Average Risk   9.6   7.1  3 X Average Risk  23.4   11.0        Use the calculated Patient Ratio above and the CHD Risk Table to determine the patient's CHD Risk.        ATP III CLASSIFICATION (LDL):  <100     mg/dL   Optimal  100-129  mg/dL   Near or Above                    Optimal  130-159  mg/dL   Borderline  160-189  mg/dL   High  >190     mg/dL   Very High Performed at [facility] [address] [place], [place] [address]   Urinalysis, Routine w reflex microscopic     Status: Abnormal   Collection Time: 03/26/18  7:16 AM  Result Value Ref Range   Color, Urine STRAW (A) YELLOW   APPearance CLEAR CLEAR   Specific Gravity, Urine  1.031 (H) 1.005 - 1.030   pH 7.0 5.0 - 8.0   Glucose, UA NEGATIVE NEGATIVE mg/dL   Hgb urine dipstick NEGATIVE NEGATIVE   Bilirubin Urine NEGATIVE NEGATIVE   Ketones, ur NEGATIVE NEGATIVE mg/dL   Protein, ur NEGATIVE NEGATIVE mg/dL   Nitrite NEGATIVE NEGATIVE   Leukocytes, UA NEGATIVE NEGATIVE    Comment: Performed at [facility] [address], [place], [place] [address]  Rapid urine drug screen (hospital performed)     Status: None   Collection Time: 03/26/18  7:16 AM  Result Value Ref Range   Opiates NONE DETECTED NONE DETECTED   Cocaine NONE DETECTED NONE DETECTED   Benzodiazepines NONE DETECTED NONE DETECTED   Amphetamines NONE DETECTED NONE DETECTED   Tetrahydrocannabinol NONE DETECTED NONE DETECTED   Barbiturates NONE DETECTED NONE DETECTED    Comment: (NOTE) DRUG SCREEN FOR MEDICAL PURPOSES ONLY.  IF CONFIRMATION IS NEEDED FOR ANY PURPOSE, NOTIFY LAB WITHIN 5 DAYS. LOWEST DETECTABLE LIMITS FOR URINE DRUG SCREEN Drug Class                     Cutoff (ng/mL) Amphetamine and metabolites    1000 Barbiturate and metabolites    200 Benzodiazepine                 859 Tricyclics and metabolites     300 Opiates and metabolites        300 Cocaine and metabolites        300 THC                            50 Performed at [facility], [place] [address], [place], [place] [address]     Ct Head Wo Contrast  Result Date: 03/26/2018 CLINICAL DATA:  Confusion after a fall. Swelling to the left lip, I, and skin tear to the forearm. Not on blood thinners. EXAM: CT HEAD WITHOUT CONTRAST CT MAXILLOFACIAL WITHOUT CONTRAST CT CERVICAL SPINE WITHOUT CONTRAST TECHNIQUE: Multidetector CT imaging of the head, cervical spine, and maxillofacial structures were performed using the standard protocol without intravenous contrast. Multiplanar CT image reconstructions of the cervical spine and maxillofacial structures were also generated. COMPARISON:  CT head 06/08/2017. MRI cervical spine  10/07/2014. FINDINGS: CT HEAD FINDINGS Brain: No evidence of acute infarction, hemorrhage, hydrocephalus, extra-axial collection or mass lesion/mass effect. Mild diffuse cerebral atrophy. Low-attenuation changes  in the deep white matter consistent small vessel ischemia. Vascular: Mild intracranial vascular calcifications. Skull: Calvarium appears intact. Other: No significant changes since previous study. CT MAXILLOFACIAL FINDINGS Osseous: Acute depressed fracture of the anterior right maxillary antral wall extending to the inferolateral wall. The orbital rims appear intact. No additional facial fractures are identified. Degenerative changes in the mandibular heads. Orbits: Globes and extraocular muscles appear intact and symmetrical. Sinuses: Opacification and air-fluid level in the right maxillary antrum with increased density consistent with hemorrhage. Paranasal sinuses and mastoid air cells are otherwise clear. Soft tissues: Right periorbital soft tissue hematoma. No retrobulbar involvement. Soft tissue hematoma extends also over the right side of the facial bones and right mandible. CT CERVICAL SPINE FINDINGS Alignment: Straightening of the usual cervical lordosis with slight anterior subluxation of C3 on C4 and C4 on C5 as well as retrolisthesis of C5 on C6. Alignment is unchanged since previous study and is likely degenerative or postoperative. Normal alignment of the facet joints. Skull base and vertebrae: Skull base appears intact. No vertebral compression deformities. Postoperative changes with anterior plate and screw fixation and intervertebral fusion at C6-7. No destructive or expansile bone lesions. Soft tissues and spinal canal: No prevertebral soft tissue swelling. No abnormal paraspinal soft tissue mass or infiltration. Disc levels: Degenerative narrowing and endplate hypertrophic changes at C5-6. Degenerative changes in the facet joints. Upper chest: Lung apices are clear. Other: None. IMPRESSION:  1. CT HEAD: No acute intracranial abnormalities. Stable atrophy and small vessel ischemic changes. 2. CT maxillofacial: Acute depressed fracture of the anterior right maxillary antral wall with extension to the inferolateral wall of the right maxillary sinus. Associated hemorrhage in the right maxillary antrum. Right periorbital and facial soft tissue hematomas. No additional facial fractures identified. 3. CT CERVICAL SPINE: Stable alignment. No acute displaced fractures identified. Electronically Signed   By: Lucienne Capers M.D.   On: 03/26/2018 00:37   Ct Angio Chest Pe W And/or Wo Contrast  Result Date: 03/26/2018 CLINICAL DATA:  Syncope. EXAM: CT ANGIOGRAPHY CHEST WITH CONTRAST TECHNIQUE: Multidetector CT imaging of the chest was performed using the standard protocol during bolus administration of intravenous contrast. Multiplanar CT image reconstructions and MIPs were obtained to evaluate the vascular anatomy. CONTRAST:  175m ISOVUE-370 IOPAMIDOL (ISOVUE-370) INJECTION 76% COMPARISON:  None. FINDINGS: Cardiovascular: --Pulmonary arteries: Contrast injection is sufficient to demonstrate satisfactory opacification of the pulmonary arteries to the segmental level. There is no pulmonary embolus. The main pulmonary artery is within normal limits for size. --Aorta: Limited opacification of the aorta due to bolus timing optimization for the pulmonary arteries. Conventional 3 vessel aortic branching pattern. The aortic course and caliber are normal. There is no aortic atherosclerosis. --Heart: Normal size. No pericardial effusion. Left chest wall pacemaker leads end in the right atrium and right ventricle. Mediastinum/Nodes: No mediastinal, hilar or axillary lymphadenopathy. The visualized thyroid and thoracic esophageal course are unremarkable. Lungs/Pleura: No pulmonary nodules or masses. No pleural effusion or pneumothorax. No focal airspace consolidation. No focal pleural abnormality. Upper Abdomen:  Contrast bolus timing is not optimized for evaluation of the abdominal organs. Within this limitation, the visualized organs of the upper abdomen are normal. Musculoskeletal: Pectus excavatum. Review of the MIP images confirms the above findings. IMPRESSION: No pulmonary embolus or other acute thoracic abnormality. Electronically Signed   By: KUlyses JarredM.D.   On: 03/26/2018 04:23   Ct Cervical Spine Wo Contrast  Result Date: 03/26/2018 CLINICAL DATA:  Confusion after a fall. Swelling to the left  lip, I, and skin tear to the forearm. Not on blood thinners. EXAM: CT HEAD WITHOUT CONTRAST CT MAXILLOFACIAL WITHOUT CONTRAST CT CERVICAL SPINE WITHOUT CONTRAST TECHNIQUE: Multidetector CT imaging of the head, cervical spine, and maxillofacial structures were performed using the standard protocol without intravenous contrast. Multiplanar CT image reconstructions of the cervical spine and maxillofacial structures were also generated. COMPARISON:  CT head 06/08/2017. MRI cervical spine 10/07/2014. FINDINGS: CT HEAD FINDINGS Brain: No evidence of acute infarction, hemorrhage, hydrocephalus, extra-axial collection or mass lesion/mass effect. Mild diffuse cerebral atrophy. Low-attenuation changes in the deep white matter consistent small vessel ischemia. Vascular: Mild intracranial vascular calcifications. Skull: Calvarium appears intact. Other: No significant changes since previous study. CT MAXILLOFACIAL FINDINGS Osseous: Acute depressed fracture of the anterior right maxillary antral wall extending to the inferolateral wall. The orbital rims appear intact. No additional facial fractures are identified. Degenerative changes in the mandibular heads. Orbits: Globes and extraocular muscles appear intact and symmetrical. Sinuses: Opacification and air-fluid level in the right maxillary antrum with increased density consistent with hemorrhage. Paranasal sinuses and mastoid air cells are otherwise clear. Soft tissues: Right  periorbital soft tissue hematoma. No retrobulbar involvement. Soft tissue hematoma extends also over the right side of the facial bones and right mandible. CT CERVICAL SPINE FINDINGS Alignment: Straightening of the usual cervical lordosis with slight anterior subluxation of C3 on C4 and C4 on C5 as well as retrolisthesis of C5 on C6. Alignment is unchanged since previous study and is likely degenerative or postoperative. Normal alignment of the facet joints. Skull base and vertebrae: Skull base appears intact. No vertebral compression deformities. Postoperative changes with anterior plate and screw fixation and intervertebral fusion at C6-7. No destructive or expansile bone lesions. Soft tissues and spinal canal: No prevertebral soft tissue swelling. No abnormal paraspinal soft tissue mass or infiltration. Disc levels: Degenerative narrowing and endplate hypertrophic changes at C5-6. Degenerative changes in the facet joints. Upper chest: Lung apices are clear. Other: None. IMPRESSION: 1. CT HEAD: No acute intracranial abnormalities. Stable atrophy and small vessel ischemic changes. 2. CT maxillofacial: Acute depressed fracture of the anterior right maxillary antral wall with extension to the inferolateral wall of the right maxillary sinus. Associated hemorrhage in the right maxillary antrum. Right periorbital and facial soft tissue hematomas. No additional facial fractures identified. 3. CT CERVICAL SPINE: Stable alignment. No acute displaced fractures identified. Electronically Signed   By: Lucienne Capers M.D.   On: 03/26/2018 00:37   Ct Maxillofacial Wo Cm  Result Date: 03/26/2018 CLINICAL DATA:  Confusion after a fall. Swelling to the left lip, I, and skin tear to the forearm. Not on blood thinners. EXAM: CT HEAD WITHOUT CONTRAST CT MAXILLOFACIAL WITHOUT CONTRAST CT CERVICAL SPINE WITHOUT CONTRAST TECHNIQUE: Multidetector CT imaging of the head, cervical spine, and maxillofacial structures were performed  using the standard protocol without intravenous contrast. Multiplanar CT image reconstructions of the cervical spine and maxillofacial structures were also generated. COMPARISON:  CT head 06/08/2017. MRI cervical spine 10/07/2014. FINDINGS: CT HEAD FINDINGS Brain: No evidence of acute infarction, hemorrhage, hydrocephalus, extra-axial collection or mass lesion/mass effect. Mild diffuse cerebral atrophy. Low-attenuation changes in the deep white matter consistent small vessel ischemia. Vascular: Mild intracranial vascular calcifications. Skull: Calvarium appears intact. Other: No significant changes since previous study. CT MAXILLOFACIAL FINDINGS Osseous: Acute depressed fracture of the anterior right maxillary antral wall extending to the inferolateral wall. The orbital rims appear intact. No additional facial fractures are identified. Degenerative changes in the mandibular heads. Orbits: Globes  and extraocular muscles appear intact and symmetrical. Sinuses: Opacification and air-fluid level in the right maxillary antrum with increased density consistent with hemorrhage. Paranasal sinuses and mastoid air cells are otherwise clear. Soft tissues: Right periorbital soft tissue hematoma. No retrobulbar involvement. Soft tissue hematoma extends also over the right side of the facial bones and right mandible. CT CERVICAL SPINE FINDINGS Alignment: Straightening of the usual cervical lordosis with slight anterior subluxation of C3 on C4 and C4 on C5 as well as retrolisthesis of C5 on C6. Alignment is unchanged since previous study and is likely degenerative or postoperative. Normal alignment of the facet joints. Skull base and vertebrae: Skull base appears intact. No vertebral compression deformities. Postoperative changes with anterior plate and screw fixation and intervertebral fusion at C6-7. No destructive or expansile bone lesions. Soft tissues and spinal canal: No prevertebral soft tissue swelling. No abnormal  paraspinal soft tissue mass or infiltration. Disc levels: Degenerative narrowing and endplate hypertrophic changes at C5-6. Degenerative changes in the facet joints. Upper chest: Lung apices are clear. Other: None. IMPRESSION: 1. CT HEAD: No acute intracranial abnormalities. Stable atrophy and small vessel ischemic changes. 2. CT maxillofacial: Acute depressed fracture of the anterior right maxillary antral wall with extension to the inferolateral wall of the right maxillary sinus. Associated hemorrhage in the right maxillary antrum. Right periorbital and facial soft tissue hematomas. No additional facial fractures identified. 3. CT CERVICAL SPINE: Stable alignment. No acute displaced fractures identified. Electronically Signed   By: Lucienne Capers M.D.   On: 03/26/2018 00:37   Review of Systems All other systems are reviewed and are negative except as noted in the HPI  Blood pressure 116/61, pulse 61, temperature 97.8 F (36.6 C), temperature source Oral, resp. rate 14, height 5' 2.5" (1.588 m), weight 52.2 kg, SpO2 98 %. Physical Exam  Constitutional: She is oriented to person, place, and time. She appears well-developed and well-nourished. No distress.  Head: Normocephalic. Abrasion and contusion on the right face. Head is without laceration.  Eyes: Pupils are equal, round, reactive to light. Extraocular motion is intact.  Ears: Examination of the ears shows normal auricles and external auditory canals bilaterally. Nose: Nasal examination shows normal mucosa, septum, turbinates.  Face: Facial examination shows right maxillary abrasion and edema. Palpation of the face elicit right midface tenderness.  Mouth: Oral cavity examination shows no mucosal lacerations. No significant trismus is noted.  Neck Palpation of the neck reveals no lymphadenopathy or mass. The trachea is midline. The thyroid is not significantly enlarged.  Cardiovascular: Normal rate, regular rhythm and normal heart  sounds. Pulmonary/Chest: Effort normal and breath sounds normal. No stridor. No respiratory distress. She has no wheezes.  Abdominal: Soft. She exhibits no distension.  Neurological: She is alert and oriented to person, place, and time.  Moving all extremities  Skin: Skin is warm and dry. No rash noted. She is not diaphoretic. No erythema.  Psychiatric: She has a normal mood and affect.   Assessment/Plan: Mildly depressed right maxillary fractures. No acute intervention is needed at this time. Likely will not need surgical intervention. Patient may follow up with me as an outpatient in 1-2 weeks.   Alani Sabbagh W Terrell Ostrand 03/26/2018, 10:19 AM

## 2018-03-26 NOTE — Progress Notes (Signed)
ANTICOAGULATION CONSULT NOTE - Initial Consult  Pharmacy Consult for Heparin  Indication: chest pain/ACS  Allergies  Allergen Reactions  . Chocolate Swelling  . Penicillins Anaphylaxis    Has patient had a PCN reaction causing immediate rash, facial/tongue/throat swelling, SOB or lightheadedness with hypotension: Yes Has patient had a PCN reaction causing severe rash involving mucus membranes or skin necrosis: No Has patient had a PCN reaction that required hospitalization: Unknown Has patient had a PCN reaction occurring within the last 10 years: No If all of the above answers are "NO", then may proceed with Cephalosporin use.     Patient Measurements: Height: 5' 2.5" (158.8 cm) Weight: 115 lb (52.2 kg) IBW/kg (Calculated) : 51.25  Vital Signs: Temp: 97.8 F (36.6 C) (10/09 0040) Temp Source: Oral (10/09 0040) BP: 129/67 (10/09 0500) Pulse Rate: 78 (10/09 0500)  Labs: Recent Labs    03/25/18 2255 03/25/18 2323  HGB 13.9  --   HCT 42.5  --   PLT 211  --   CREATININE 0.49  --   CKTOTAL  --  132    Estimated Creatinine Clearance: 50.7 mL/min (by C-G formula based on SCr of 0.49 mg/dL).   Medical History: Past Medical History:  Diagnosis Date  . Anxiety    " husband passed unexpectedly in 2013" (11/29/2017)  . Arthritis    "right knee, hands, cervical & lumbar" (11/29/2017)  . DDD (degenerative disc disease), cervical   . DDD (degenerative disc disease), lumbar   . Degenerative scoliosis in adult patient   . GERD (gastroesophageal reflux disease)   . History of loop recorder- removed 11/29/17 11/30/2017  . Hypertension   . Nerve pain    CYST IN LOWER LUMBAR SPINE  . Pneumonia 1990s X 1  . PONV (postoperative nausea and vomiting)    WHEN SHE HAD NECK SURGERY ?2002, HAD SOME N/V, NONE WITH HER OTHER SURGERIES  . Presence of permanent cardiac pacemaker 11/29/2017  . RBBB   . S/P placement of cardiac pacemaker 11/29/17 MDT 11/30/2017    Assessment: 72 y/o F to  start heparin for r/o NSTEMI, CBC/renal function good, PTA meds reviewed.   Goal of Therapy:  Heparin level 0.3-0.7 units/ml Monitor platelets by anticoagulation protocol: Yes   Plan:  No bolus with facial trauma from fall 10/8 Start heparin drip at 650 units/hr 1500 HL Daily CBC/HL Monitor for bleeding  Narda Bonds 03/26/2018,6:27 AM

## 2018-03-26 NOTE — Progress Notes (Signed)
[facility] for Heparin  Indication: chest pain/ACS  Allergies  Allergen Reactions  . Chocolate Swelling  . Penicillins Anaphylaxis    Has patient had a PCN reaction causing immediate rash, facial/tongue/throat swelling, SOB or lightheadedness with hypotension: Yes Has patient had a PCN reaction causing severe rash involving mucus membranes or skin necrosis: No Has patient had a PCN reaction that required hospitalization: Unknown Has patient had a PCN reaction occurring within the last 10 years: No If all of the above answers are "NO", then may proceed with Cephalosporin use.     Patient Measurements: Height: 5' 2.5" (158.8 cm) Weight: 115 lb (52.2 kg) IBW/kg (Calculated) : 51.25  Vital Signs: Temp: 99.2 F (37.3 C) (10/09 1554) Temp Source: Oral (10/09 1554) BP: 130/57 (10/09 1600) Pulse Rate: 73 (10/09 1100)  Labs: Recent Labs    03/25/18 2255 03/25/18 2323 03/26/18 0550 03/26/18 1537  HGB 13.9  --   --   --   HCT 42.5  --   --   --   PLT 211  --   --   --   HEPARINUNFRC  --   --   --  0.28*  CREATININE 0.49  --   --   --   CKTOTAL  --  132  --   --   TROPONINI  --   --  0.46* 0.71*    Estimated Creatinine Clearance: 50.7 mL/min (by C-G formula based on SCr of 0.49 mg/dL).   Medical History: Past Medical History:  Diagnosis Date  . Anxiety    " husband passed unexpectedly in 2013" (11/29/2017)  . Arthritis    "right knee, hands, cervical & lumbar" (11/29/2017)  . DDD (degenerative disc disease), cervical   . DDD (degenerative disc disease), lumbar   . Degenerative scoliosis in adult patient   . GERD (gastroesophageal reflux disease)   . History of loop recorder- removed 11/29/17 11/30/2017  . Hypertension   . Nerve pain    CYST IN LOWER LUMBAR SPINE  . Pneumonia 1990s X 1  . PONV (postoperative nausea and vomiting)    WHEN SHE HAD NECK SURGERY ?2002, HAD SOME N/V, NONE WITH HER OTHER SURGERIES  . Presence of permanent  cardiac pacemaker 11/29/2017  . RBBB   . S/P placement of cardiac pacemaker 11/29/17 MDT 11/30/2017    Assessment: 72 y/o F to start heparin for r/o NSTEMI, CBC/renal function good, PTA meds reviewed.   Heparin level just below goal at 0.28 this afternoon on 650 units/hr. No bleeding issues noted. Planning cath tomorrow.   Goal of Therapy:  Heparin level 0.3-0.7 units/ml Monitor platelets by anticoagulation protocol: Yes   Plan:  No bolus with facial trauma from fall 10/8 Increase heparin drip to 750 units/hr Daily CBC/HL Monitor for bleeding  Erin Hearing PharmD., BCPS Clinical Pharmacist 03/26/2018 6:20 PM

## 2018-03-26 NOTE — ED Notes (Signed)
Re-paged Cardiology, did not return call after first page

## 2018-03-26 NOTE — Progress Notes (Signed)
Brief cardiology update: Seen by overnight fellow. I reviewed the events with her today. She does not remember anything. She was sitting in her chair reading a book, and the next she remembers is that she was in the hospital. Per her family, she must have walked to the kitchen and had the event there, then gotten up and call her son. On the arrival of her family, she was awake but had trouble answering specific questions. She was bleeding on her arm and face, and they also noted blood in the kitchen and in the bathroom. CT head did not show any acute abnormalities.  Today she has a mild headache but otherwise is doing well. She has felt much improved since getting her pacemaker and has had no additional syncope since that time.  She denies increased alcohol use (has a small glass in the afternoon and one with dinner). Widowed, independent, no tobacco or illicits. Uses occasional low dose pain medication for her neck and back pain.  Her troponins have risen, and we are treating her for an NSTEMI. It is unclear to me what the inciting event was. Awaiting echo. Plan for cath tomorrow.  Risks and benefits of cardiac catheterization have been discussed with the patient.  These include bleeding, infection, kidney damage, stroke, heart attack, death.  The patient understands these risks and is willing to proceed.  NPO at midnight.   Buford Dresser, MD, PhD [facility]  [address], [place], [place] [address] [phone]

## 2018-03-26 NOTE — ED Notes (Signed)
x2 attempts when pt.went to bathrooms she did go.didnt use cup

## 2018-03-26 NOTE — ED Notes (Signed)
Cardiology notified that pts troponin resulted at 0.46.

## 2018-03-26 NOTE — ED Notes (Signed)
ED Provider at bedside. 

## 2018-03-26 NOTE — H&P (Signed)
Cardiology History & Physical    Patient ID: DEMARIS BOUSQUET MRN: [id number], DOB: 24-Nov-1945 Date of Encounter: 03/26/2018, 5:50 AM Primary Physician: Harlan Stains, MD  Chief Complaint: Syncope   HPI: Karen Gomez is a 72 y.o. female with history of intermittent AV block s/p dual chamber MDT PPM, HTN, who presents with an episode of unheralded syncope.  The patientn was feeling well this evening and had no prodrome whatsoever.  At some point she lost consciousness and had a fall; she called her son, who came and summoned EMS to bring her to the [facility] ED, but the first thing she recalls is being in the emergency room.  She denied CP, SOB, palpitations before or after the episode.  She has not had any recent medication changes, viral illnesses, sick contacts or GI symtpoms.  This is her first episode of syncope since implantation of her PPM in June.  In the ED, her initial labs were WNL, save for blood EtOH level of 157.  She underwent a CT of her head, spine and sinuses; this showed fracture of the right maxilla.  Her 1st troponin was WNL, but her 3 hour delta was elevated at 0.38.  She then had a CT-PE study which showed no evidence of PE. Her device interrogation showed no arrhythmias.  Upon my interview, the patient has significant facial pain, but no other complaints.  Past Medical History:  Diagnosis Date  . Anxiety    " husband passed unexpectedly in 2013" (11/29/2017)  . Arthritis    "right knee, hands, cervical & lumbar" (11/29/2017)  . DDD (degenerative disc disease), cervical   . DDD (degenerative disc disease), lumbar   . Degenerative scoliosis in adult patient   . GERD (gastroesophageal reflux disease)   . History of loop recorder- removed 11/29/17 11/30/2017  . Hypertension   . Nerve pain    CYST IN LOWER LUMBAR SPINE  . Pneumonia 1990s X 1  . PONV (postoperative nausea and vomiting)    WHEN SHE HAD NECK SURGERY ?2002, HAD SOME N/V, NONE WITH HER OTHER SURGERIES  . Presence of  permanent cardiac pacemaker 11/29/2017  . RBBB   . S/P placement of cardiac pacemaker 11/29/17 MDT 11/30/2017     Surgical History:  Past Surgical History:  Procedure Laterality Date  . ANTERIOR CERVICAL DECOMP/DISCECTOMY FUSION  ~ 2002   PLACEMENT OF TITANIUM PLATE  . BACK SURGERY    . CATARACT EXTRACTION W/ INTRAOCULAR LENS  IMPLANT, BILATERAL    . CESAREAN SECTION  1973; 1977  . DILATION AND CURETTAGE OF UTERUS     PART OF UTERINE POLYP REMOVAL  . INSERT / REPLACE / REMOVE PACEMAKER  11/29/2017  . LOOP RECORDER INSERTION N/A 07/09/2017   Procedure: LOOP RECORDER INSERTION;  Surgeon: Constance Haw, MD;  Location: [facility];  Service: Cardiovascular;  Laterality: N/A;  . LOOP RECORDER REMOVAL  11/29/2017  . LOOP RECORDER REMOVAL N/A 11/29/2017   Procedure: LOOP RECORDER REMOVAL;  Surgeon: Constance Haw, MD;  Location: [facility];  Service: Cardiovascular;  Laterality: N/A;  . PACEMAKER IMPLANT N/A 11/29/2017   Procedure: PACEMAKER IMPLANT;  Surgeon: Constance Haw, MD;  Location: [facility];  Service: Cardiovascular;  Laterality: N/A;  . REVERSE SHOULDER ARTHROPLASTY Right 06/28/2016   Procedure: REVERSE SHOULDER ARTHROPLASTY;  Surgeon: Justice Britain, MD;  Location: [facility];  Service: Orthopedics;  Laterality: Right;  . TONSILLECTOMY  1940s  . UTERINE POLYPS  Home Meds: Prior to Admission medications   Medication Sig Start Date End Date Taking? Authorizing Provider  calcium-vitamin D (OSCAL WITH D) 500-200 MG-UNIT tablet Take 1 tablet by mouth daily.   Yes [provider]  cholecalciferol (VITAMIN D) 1000 units tablet Take 1,000 Units by mouth daily.   Yes [provider]  escitalopram (LEXAPRO) 5 MG tablet Take 5 mg by mouth daily.   Yes [provider]  estradiol (ESTRACE) 0.5 MG tablet Take 0.5 mg by mouth daily.   Yes [provider]  gabapentin (NEURONTIN) 300 MG capsule Take 300 mg by mouth at  bedtime.  03/10/16  Yes [provider]  HYDROcodone-acetaminophen (NORCO) 10-325 MG tablet Take 0.5 tablets by mouth daily as needed for moderate pain.   Yes [provider]  lisinopril (PRINIVIL,ZESTRIL) 10 MG tablet Take 10 mg by mouth daily.   Yes [provider]  Magnesium 250 MG TABS Take 250 mg by mouth daily.   Yes [provider]  Multiple Vitamin (MULTIVITAMIN WITH MINERALS) TABS tablet Take 1 tablet by mouth daily. Centrum Silver   Yes [provider]  progesterone (PROMETRIUM) 100 MG capsule Take 100 mg by mouth at bedtime.    Yes [provider]    Allergies:  Allergies  Allergen Reactions  . Chocolate Swelling  . Penicillins Anaphylaxis    Has patient had a PCN reaction causing immediate rash, facial/tongue/throat swelling, SOB or lightheadedness with hypotension: Yes Has patient had a PCN reaction causing severe rash involving mucus membranes or skin necrosis: No Has patient had a PCN reaction that required hospitalization: Unknown Has patient had a PCN reaction occurring within the last 10 years: No If all of the above answers are "NO", then may proceed with Cephalosporin use.     Social History   Socioeconomic History  . Marital status: Widowed    Spouse name: Not on file  . Number of children: Not on file  . Years of education: Not on file  . Highest education level: Not on file  Occupational History  . Not on file  Social Needs  . Financial resource strain: Not on file  . Food insecurity:    Worry: Not on file    Inability: Not on file  . Transportation needs:    Medical: Not on file    Non-medical: Not on file  Tobacco Use  . Smoking status: Never Smoker  . Smokeless tobacco: Never Used  Substance and Sexual Activity  . Alcohol use: Yes    Alcohol/week: 14.0 standard drinks    Types: 14 Glasses of wine per week  . Drug use: Never  . Sexual activity: Not Currently  Lifestyle  . Physical activity:      Days per week: Not on file    Minutes per session: Not on file  . Stress: Not on file  Relationships  . Social connections:    Talks on phone: Not on file    Gets together: Not on file    Attends religious service: Not on file    Active member of club or organization: Not on file    Attends meetings of clubs or organizations: Not on file    Relationship status: Not on file  . Intimate partner violence:    Fear of current or ex partner: Not on file    Emotionally abused: Not on file    Physically abused: Not on file    Forced sexual activity: Not on file  Other Topics  Concern  . Not on file  Social History Narrative  . Not on file     Family History  Problem Relation Age of Onset  . Dementia Mother   . Heart disease Father   . Heart disease Brother     Review of Systems: All other systems reviewed and are otherwise negative except as noted above.  Labs:   Lab Results  Component Value Date   WBC 6.4 03/25/2018   HGB 13.9 03/25/2018   HCT 42.5 03/25/2018   MCV 102.4 (H) 03/25/2018   PLT 211 03/25/2018    Recent Labs  Lab 03/25/18 2255  NA 134*  K 3.6  CL 104  CO2 23  BUN 10  CREATININE 0.49  CALCIUM 8.7*  PROT 6.0*  BILITOT 0.3  ALKPHOS 64  ALT 31  AST 28  GLUCOSE 93   Recent Labs    03/25/18 2323  CKTOTAL 132   No results found for: CHOL, HDL, LDLCALC, TRIG Lab Results  Component Value Date   DDIMER 0.32 06/08/2017    Radiology/Studies:  Ct Head Wo Contrast  Result Date: 03/26/2018 CLINICAL DATA:  Confusion after a fall. Swelling to the left lip, I, and skin tear to the forearm. Not on blood thinners. EXAM: CT HEAD WITHOUT CONTRAST CT MAXILLOFACIAL WITHOUT CONTRAST CT CERVICAL SPINE WITHOUT CONTRAST TECHNIQUE: Multidetector CT imaging of the head, cervical spine, and maxillofacial structures were performed using the standard protocol without intravenous contrast. Multiplanar CT image reconstructions of the cervical spine and maxillofacial  structures were also generated. COMPARISON:  CT head 06/08/2017. MRI cervical spine 10/07/2014. FINDINGS: CT HEAD FINDINGS Brain: No evidence of acute infarction, hemorrhage, hydrocephalus, extra-axial collection or mass lesion/mass effect. Mild diffuse cerebral atrophy. Low-attenuation changes in the deep white matter consistent small vessel ischemia. Vascular: Mild intracranial vascular calcifications. Skull: Calvarium appears intact. Other: No significant changes since previous study. CT MAXILLOFACIAL FINDINGS Osseous: Acute depressed fracture of the anterior right maxillary antral wall extending to the inferolateral wall. The orbital rims appear intact. No additional facial fractures are identified. Degenerative changes in the mandibular heads. Orbits: Globes and extraocular muscles appear intact and symmetrical. Sinuses: Opacification and air-fluid level in the right maxillary antrum with increased density consistent with hemorrhage. Paranasal sinuses and mastoid air cells are otherwise clear. Soft tissues: Right periorbital soft tissue hematoma. No retrobulbar involvement. Soft tissue hematoma extends also over the right side of the facial bones and right mandible. CT CERVICAL SPINE FINDINGS Alignment: Straightening of the usual cervical lordosis with slight anterior subluxation of C3 on C4 and C4 on C5 as well as retrolisthesis of C5 on C6. Alignment is unchanged since previous study and is likely degenerative or postoperative. Normal alignment of the facet joints. Skull base and vertebrae: Skull base appears intact. No vertebral compression deformities. Postoperative changes with anterior plate and screw fixation and intervertebral fusion at C6-7. No destructive or expansile bone lesions. Soft tissues and spinal canal: No prevertebral soft tissue swelling. No abnormal paraspinal soft tissue mass or infiltration. Disc levels: Degenerative narrowing and endplate hypertrophic changes at C5-6. Degenerative  changes in the facet joints. Upper chest: Lung apices are clear. Other: None. IMPRESSION: 1. CT HEAD: No acute intracranial abnormalities. Stable atrophy and small vessel ischemic changes. 2. CT maxillofacial: Acute depressed fracture of the anterior right maxillary antral wall with extension to the inferolateral wall of the right maxillary sinus. Associated hemorrhage in the right maxillary antrum. Right periorbital and facial soft tissue hematomas. No additional facial fractures identified.  3. CT CERVICAL SPINE: Stable alignment. No acute displaced fractures identified. Electronically Signed   By: Lucienne Capers M.D.   On: 03/26/2018 00:37   Ct Angio Chest Pe W And/or Wo Contrast  Result Date: 03/26/2018 CLINICAL DATA:  Syncope. EXAM: CT ANGIOGRAPHY CHEST WITH CONTRAST TECHNIQUE: Multidetector CT imaging of the chest was performed using the standard protocol during bolus administration of intravenous contrast. Multiplanar CT image reconstructions and MIPs were obtained to evaluate the vascular anatomy. CONTRAST:  165mL ISOVUE-370 IOPAMIDOL (ISOVUE-370) INJECTION 76% COMPARISON:  None. FINDINGS: Cardiovascular: --Pulmonary arteries: Contrast injection is sufficient to demonstrate satisfactory opacification of the pulmonary arteries to the segmental level. There is no pulmonary embolus. The main pulmonary artery is within normal limits for size. --Aorta: Limited opacification of the aorta due to bolus timing optimization for the pulmonary arteries. Conventional 3 vessel aortic branching pattern. The aortic course and caliber are normal. There is no aortic atherosclerosis. --Heart: Normal size. No pericardial effusion. Left chest wall pacemaker leads end in the right atrium and right ventricle. Mediastinum/Nodes: No mediastinal, hilar or axillary lymphadenopathy. The visualized thyroid and thoracic esophageal course are unremarkable. Lungs/Pleura: No pulmonary nodules or masses. No pleural effusion or  pneumothorax. No focal airspace consolidation. No focal pleural abnormality. Upper Abdomen: Contrast bolus timing is not optimized for evaluation of the abdominal organs. Within this limitation, the visualized organs of the upper abdomen are normal. Musculoskeletal: Pectus excavatum. Review of the MIP images confirms the above findings. IMPRESSION: No pulmonary embolus or other acute thoracic abnormality. Electronically Signed   By: Ulyses Jarred M.D.   On: 03/26/2018 04:23   Ct Cervical Spine Wo Contrast  Result Date: 03/26/2018 CLINICAL DATA:  Confusion after a fall. Swelling to the left lip, I, and skin tear to the forearm. Not on blood thinners. EXAM: CT HEAD WITHOUT CONTRAST CT MAXILLOFACIAL WITHOUT CONTRAST CT CERVICAL SPINE WITHOUT CONTRAST TECHNIQUE: Multidetector CT imaging of the head, cervical spine, and maxillofacial structures were performed using the standard protocol without intravenous contrast. Multiplanar CT image reconstructions of the cervical spine and maxillofacial structures were also generated. COMPARISON:  CT head 06/08/2017. MRI cervical spine 10/07/2014. FINDINGS: CT HEAD FINDINGS Brain: No evidence of acute infarction, hemorrhage, hydrocephalus, extra-axial collection or mass lesion/mass effect. Mild diffuse cerebral atrophy. Low-attenuation changes in the deep white matter consistent small vessel ischemia. Vascular: Mild intracranial vascular calcifications. Skull: Calvarium appears intact. Other: No significant changes since previous study. CT MAXILLOFACIAL FINDINGS Osseous: Acute depressed fracture of the anterior right maxillary antral wall extending to the inferolateral wall. The orbital rims appear intact. No additional facial fractures are identified. Degenerative changes in the mandibular heads. Orbits: Globes and extraocular muscles appear intact and symmetrical. Sinuses: Opacification and air-fluid level in the right maxillary antrum with increased density consistent with  hemorrhage. Paranasal sinuses and mastoid air cells are otherwise clear. Soft tissues: Right periorbital soft tissue hematoma. No retrobulbar involvement. Soft tissue hematoma extends also over the right side of the facial bones and right mandible. CT CERVICAL SPINE FINDINGS Alignment: Straightening of the usual cervical lordosis with slight anterior subluxation of C3 on C4 and C4 on C5 as well as retrolisthesis of C5 on C6. Alignment is unchanged since previous study and is likely degenerative or postoperative. Normal alignment of the facet joints. Skull base and vertebrae: Skull base appears intact. No vertebral compression deformities. Postoperative changes with anterior plate and screw fixation and intervertebral fusion at C6-7. No destructive or expansile bone lesions. Soft tissues and spinal canal: No  prevertebral soft tissue swelling. No abnormal paraspinal soft tissue mass or infiltration. Disc levels: Degenerative narrowing and endplate hypertrophic changes at C5-6. Degenerative changes in the facet joints. Upper chest: Lung apices are clear. Other: None. IMPRESSION: 1. CT HEAD: No acute intracranial abnormalities. Stable atrophy and small vessel ischemic changes. 2. CT maxillofacial: Acute depressed fracture of the anterior right maxillary antral wall with extension to the inferolateral wall of the right maxillary sinus. Associated hemorrhage in the right maxillary antrum. Right periorbital and facial soft tissue hematomas. No additional facial fractures identified. 3. CT CERVICAL SPINE: Stable alignment. No acute displaced fractures identified. Electronically Signed   By: Lucienne Capers M.D.   On: 03/26/2018 00:37   Ct Maxillofacial Wo Cm  Result Date: 03/26/2018 CLINICAL DATA:  Confusion after a fall. Swelling to the left lip, I, and skin tear to the forearm. Not on blood thinners. EXAM: CT HEAD WITHOUT CONTRAST CT MAXILLOFACIAL WITHOUT CONTRAST CT CERVICAL SPINE WITHOUT CONTRAST TECHNIQUE:  Multidetector CT imaging of the head, cervical spine, and maxillofacial structures were performed using the standard protocol without intravenous contrast. Multiplanar CT image reconstructions of the cervical spine and maxillofacial structures were also generated. COMPARISON:  CT head 06/08/2017. MRI cervical spine 10/07/2014. FINDINGS: CT HEAD FINDINGS Brain: No evidence of acute infarction, hemorrhage, hydrocephalus, extra-axial collection or mass lesion/mass effect. Mild diffuse cerebral atrophy. Low-attenuation changes in the deep white matter consistent small vessel ischemia. Vascular: Mild intracranial vascular calcifications. Skull: Calvarium appears intact. Other: No significant changes since previous study. CT MAXILLOFACIAL FINDINGS Osseous: Acute depressed fracture of the anterior right maxillary antral wall extending to the inferolateral wall. The orbital rims appear intact. No additional facial fractures are identified. Degenerative changes in the mandibular heads. Orbits: Globes and extraocular muscles appear intact and symmetrical. Sinuses: Opacification and air-fluid level in the right maxillary antrum with increased density consistent with hemorrhage. Paranasal sinuses and mastoid air cells are otherwise clear. Soft tissues: Right periorbital soft tissue hematoma. No retrobulbar involvement. Soft tissue hematoma extends also over the right side of the facial bones and right mandible. CT CERVICAL SPINE FINDINGS Alignment: Straightening of the usual cervical lordosis with slight anterior subluxation of C3 on C4 and C4 on C5 as well as retrolisthesis of C5 on C6. Alignment is unchanged since previous study and is likely degenerative or postoperative. Normal alignment of the facet joints. Skull base and vertebrae: Skull base appears intact. No vertebral compression deformities. Postoperative changes with anterior plate and screw fixation and intervertebral fusion at C6-7. No destructive or expansile bone  lesions. Soft tissues and spinal canal: No prevertebral soft tissue swelling. No abnormal paraspinal soft tissue mass or infiltration. Disc levels: Degenerative narrowing and endplate hypertrophic changes at C5-6. Degenerative changes in the facet joints. Upper chest: Lung apices are clear. Other: None. IMPRESSION: 1. CT HEAD: No acute intracranial abnormalities. Stable atrophy and small vessel ischemic changes. 2. CT maxillofacial: Acute depressed fracture of the anterior right maxillary antral wall with extension to the inferolateral wall of the right maxillary sinus. Associated hemorrhage in the right maxillary antrum. Right periorbital and facial soft tissue hematomas. No additional facial fractures identified. 3. CT CERVICAL SPINE: Stable alignment. No acute displaced fractures identified. Electronically Signed   By: Lucienne Capers M.D.   On: 03/26/2018 00:37   Wt Readings from Last 3 Encounters:  03/25/18 52.2 kg  03/11/18 52.2 kg  11/30/17 52.8 kg    EKG: NSR, RBBB, LAFB.  Physical Exam: Blood pressure 129/67, pulse 78, temperature 97.8 F (  36.6 C), temperature source Oral, resp. rate 17, height 5' 2.5" (1.588 m), weight 52.2 kg, SpO2 98 %. Body mass index is 20.7 kg/m. General: Well developed, well nourished, in no acute distress. Head: Normocephalic, atraumatic, sclera non-icteric, no xanthomas, nares are without discharge.  Neck: Negative for carotid bruits. JVD not elevated. Lungs: Clear bilaterally to auscultation without wheezes, rales, or rhonchi. Breathing is unlabored. Heart: RRR with S1 S2. No murmurs, rubs, or gallops appreciated. Abdomen: Soft, non-tender, non-distended with normoactive bowel sounds. No hepatomegaly. No rebound/guarding. No obvious abdominal masses. Msk:  Strength and tone appear normal for age. Extremities: No clubbing or cyanosis. No edema.  Distal pedal pulses are 2+ and equal bilaterally. Neuro: Alert and oriented X 3. No focal deficit. No facial  asymmetry. Moves all extremities spontaneously. Psych:  Responds to questions appropriately with a normal affect.    Assessment and Plan  19F with history of intermittent CHB s/p PPM, who presents with syncopal episode of unclear etiology and elevated troponin.  1.  Syncope: Unclear etiology.  No evidence of arrythmia on device interrogation.  CT-PE scan negative.  Her blood alcohol level was 157, so it is possible that she was intoxicated and had a mechanical fall.  Continue to monitor on telemetry, check echocardiogram this AM.  Could consider spot EEG/neurologic workup as well.  2.  NSTEMI: Given lack of anginal symptoms, would favor a type 2 event 2/2 hypoperfusion in the context of syncopal episode.  Per ENT, no contraindications to heparin, so will start anticoagulation while awaiting CBM trend.  Plan for ischemic evaluation to be determined pending biomarker trend and TTE.  3.  Maxillary fracture:  ENT consulted in the ED.  She may need prophylactic antibiotics, but will defer to ENT.  Oxycodone for pain control.  Signed, Doylene Canning, MD 03/26/2018, 5:50 AM

## 2018-03-26 NOTE — Progress Notes (Signed)
  Echocardiogram 2D Echocardiogram has been performed.  Merrie Roof F 03/26/2018, 3:57 PM

## 2018-03-27 ENCOUNTER — Encounter (HOSPITAL_COMMUNITY)
Admission: EM | Disposition: A | Discharge status: Home / Self Care | Payer: Self-pay | Source: Home / Self Care | Attending: Cardiology

## 2018-03-27 ENCOUNTER — Encounter (HOSPITAL_COMMUNITY): Payer: Self-pay | Admitting: Cardiovascular Disease

## 2018-03-27 DIAGNOSIS — Z91018 Allergy to other foods: Secondary | ICD-10-CM | POA: Diagnosis not present

## 2018-03-27 DIAGNOSIS — S0240CA Maxillary fracture, right side, initial encounter for closed fracture: Secondary | ICD-10-CM | POA: Diagnosis present

## 2018-03-27 DIAGNOSIS — R55 Syncope and collapse: Secondary | ICD-10-CM

## 2018-03-27 DIAGNOSIS — Z981 Arthrodesis status: Secondary | ICD-10-CM | POA: Diagnosis not present

## 2018-03-27 DIAGNOSIS — I519 Heart disease, unspecified: Secondary | ICD-10-CM | POA: Diagnosis present

## 2018-03-27 DIAGNOSIS — I452 Bifascicular block: Secondary | ICD-10-CM | POA: Diagnosis present

## 2018-03-27 DIAGNOSIS — Z79890 Hormone replacement therapy (postmenopausal): Secondary | ICD-10-CM | POA: Diagnosis not present

## 2018-03-27 DIAGNOSIS — Z880 Allergy status to penicillin: Secondary | ICD-10-CM | POA: Diagnosis not present

## 2018-03-27 DIAGNOSIS — Z79899 Other long term (current) drug therapy: Secondary | ICD-10-CM | POA: Diagnosis not present

## 2018-03-27 DIAGNOSIS — Z96611 Presence of right artificial shoulder joint: Secondary | ICD-10-CM | POA: Diagnosis present

## 2018-03-27 DIAGNOSIS — I214 Non-ST elevation (NSTEMI) myocardial infarction: Secondary | ICD-10-CM | POA: Diagnosis present

## 2018-03-27 DIAGNOSIS — I5181 Takotsubo syndrome: Secondary | ICD-10-CM

## 2018-03-27 DIAGNOSIS — W19XXXA Unspecified fall, initial encounter: Secondary | ICD-10-CM | POA: Diagnosis present

## 2018-03-27 DIAGNOSIS — S0083XA Contusion of other part of head, initial encounter: Secondary | ICD-10-CM | POA: Diagnosis present

## 2018-03-27 DIAGNOSIS — R7989 Other specified abnormal findings of blood chemistry: Secondary | ICD-10-CM | POA: Diagnosis present

## 2018-03-27 DIAGNOSIS — R402143 Coma scale, eyes open, spontaneous, at hospital admission: Secondary | ICD-10-CM | POA: Diagnosis present

## 2018-03-27 DIAGNOSIS — R402363 Coma scale, best motor response, obeys commands, at hospital admission: Secondary | ICD-10-CM | POA: Diagnosis present

## 2018-03-27 DIAGNOSIS — R931 Abnormal findings on diagnostic imaging of heart and coronary circulation: Secondary | ICD-10-CM | POA: Diagnosis present

## 2018-03-27 DIAGNOSIS — Z950 Presence of cardiac pacemaker: Secondary | ICD-10-CM | POA: Diagnosis not present

## 2018-03-27 DIAGNOSIS — F419 Anxiety disorder, unspecified: Secondary | ICD-10-CM | POA: Diagnosis present

## 2018-03-27 DIAGNOSIS — F10129 Alcohol abuse with intoxication, unspecified: Secondary | ICD-10-CM | POA: Diagnosis present

## 2018-03-27 DIAGNOSIS — R402253 Coma scale, best verbal response, oriented, at hospital admission: Secondary | ICD-10-CM | POA: Diagnosis present

## 2018-03-27 DIAGNOSIS — I10 Essential (primary) hypertension: Secondary | ICD-10-CM | POA: Diagnosis present

## 2018-03-27 DIAGNOSIS — Y906 Blood alcohol level of 120-199 mg/100 ml: Secondary | ICD-10-CM | POA: Diagnosis present

## 2018-03-27 HISTORY — PX: LEFT HEART CATH AND CORONARY ANGIOGRAPHY: CATH118249

## 2018-03-27 LAB — BASIC METABOLIC PANEL
ANION GAP: 8 (ref 5–15)
BUN: 6 mg/dL — ABNORMAL LOW (ref 8–23)
CO2: 26 mmol/L (ref 22–32)
Calcium: 8.9 mg/dL (ref 8.9–10.3)
Chloride: 106 mmol/L (ref 98–111)
Creatinine, Ser: 0.65 mg/dL (ref 0.44–1.00)
GFR calc Af Amer: >60 mL/min (ref >60)
GFR calc non Af Amer: >60 mL/min (ref >60)
GLUCOSE: 104 mg/dL — AB (ref 70–99)
Potassium: 4.2 mmol/L (ref 3.5–5.1)
Sodium: 140 mmol/L (ref 135–145)

## 2018-03-27 LAB — POCT ACTIVATED CLOTTING TIME: ACTIVATED CLOTTING TIME: 92 s

## 2018-03-27 LAB — MRSA PCR SCREENING: MRSA BY PCR: NEGATIVE

## 2018-03-27 LAB — HEPARIN LEVEL (UNFRACTIONATED): Heparin Unfractionated: 0.4 IU/mL (ref 0.30–0.70)

## 2018-03-27 SURGERY — LEFT HEART CATH AND CORONARY ANGIOGRAPHY
Anesthesia: LOCAL

## 2018-03-27 MED ORDER — SODIUM CHLORIDE 0.9% FLUSH
3.0000 mL | Freq: Two times a day (BID) | INTRAVENOUS | Status: DC
  Administered 2018-03-26 – 2018-03-28 (×2): 3 mL via INTRAVENOUS

## 2018-03-27 MED ORDER — MIDAZOLAM HCL 2 MG/2ML IJ SOLN
INTRAMUSCULAR | Status: DC | PRN
  Administered 2018-03-27: 1 mg via INTRAVENOUS

## 2018-03-27 MED ORDER — HEPARIN (PORCINE) IN NACL 1000-0.9 UT/500ML-% IV SOLN
INTRAVENOUS | Status: AC
  Filled 2018-03-27: qty 1000

## 2018-03-27 MED ORDER — MIDAZOLAM HCL 2 MG/2ML IJ SOLN
INTRAMUSCULAR | Status: AC
  Filled 2018-03-27: qty 2

## 2018-03-27 MED ORDER — SODIUM CHLORIDE 0.9 % IV SOLN: 250.0000 mL | INTRAVENOUS | Status: DC | PRN

## 2018-03-27 MED ORDER — LIDOCAINE HCL (PF) 1 % IJ SOLN
INTRAMUSCULAR | Status: DC | PRN
  Administered 2018-03-27: 15 mL via SUBCUTANEOUS

## 2018-03-27 MED ORDER — FENTANYL CITRATE (PF) 100 MCG/2ML IJ SOLN
INTRAMUSCULAR | Status: AC
  Filled 2018-03-27: qty 2

## 2018-03-27 MED ORDER — SODIUM CHLORIDE 0.9% FLUSH: 3.0000 mL | INTRAVENOUS | Status: DC | PRN

## 2018-03-27 MED ORDER — ACETAMINOPHEN 325 MG PO TABS
650.0000 mg | ORAL_TABLET | ORAL | Status: DC | PRN
  Administered 2018-03-27 – 2018-03-28 (×3): 650 mg via ORAL

## 2018-03-27 MED ORDER — SODIUM CHLORIDE 0.9 % IV SOLN: INTRAVENOUS | Status: AC

## 2018-03-27 MED ORDER — FENTANYL CITRATE (PF) 100 MCG/2ML IJ SOLN
INTRAMUSCULAR | Status: DC | PRN
  Administered 2018-03-27: 25 ug via INTRAVENOUS

## 2018-03-27 MED ORDER — ONDANSETRON HCL 4 MG/2ML IJ SOLN: 4.0000 mg | Freq: Four times a day (QID) | INTRAMUSCULAR | Status: DC | PRN

## 2018-03-27 MED ORDER — LIDOCAINE HCL (PF) 1 % IJ SOLN
INTRAMUSCULAR | Status: AC
  Filled 2018-03-27: qty 30

## 2018-03-27 MED ORDER — DIAZEPAM 5 MG PO TABS
5.0000 mg | ORAL_TABLET | Freq: Four times a day (QID) | ORAL | Status: DC | PRN
  Administered 2018-03-27: 5 mg via ORAL
  Filled 2018-03-27: qty 1

## 2018-03-27 MED ORDER — IOHEXOL 350 MG/ML SOLN
INTRAVENOUS | Status: DC | PRN
  Administered 2018-03-27: 60 mL via INTRA_ARTERIAL

## 2018-03-27 MED ORDER — HEPARIN (PORCINE) IN NACL 1000-0.9 UT/500ML-% IV SOLN
INTRAVENOUS | Status: DC | PRN
  Administered 2018-03-27 (×2): 500 mL

## 2018-03-27 MED ORDER — METOPROLOL SUCCINATE 12.5 MG HALF TABLET
12.5000 mg | ORAL_TABLET | Freq: Every day | ORAL | Status: DC
  Administered 2018-03-27 – 2018-03-28 (×2): 12.5 mg via ORAL
  Filled 2018-03-27 (×2): qty 1

## 2018-03-27 SURGICAL SUPPLY — 8 items
CATH INFINITI 5FR MULTPACK ANG (CATHETERS) ×1 IMPLANT
KIT HEART LEFT (KITS) ×2 IMPLANT
PACK CARDIAC CATHETERIZATION (CUSTOM PROCEDURE TRAY) ×2 IMPLANT
SHEATH PINNACLE 5F 10CM (SHEATH) ×2 IMPLANT
SYR MEDRAD MARK V 150ML (SYRINGE) ×2 IMPLANT
TRANSDUCER W/STOPCOCK (MISCELLANEOUS) ×2 IMPLANT
TUBING CIL FLEX 10 FLL-RA (TUBING) ×2 IMPLANT
WIRE EMERALD 3MM-J .035X150CM (WIRE) ×1 IMPLANT

## 2018-03-27 NOTE — Progress Notes (Signed)
Progress Note  Patient Name: Karen Gomez Date of Encounter: 03/27/2018  Primary Cardiologist: Will Meredith Leeds, MD   Subjective   No acute events overnight. Facial hematomas are changing as expected. Amenable to cath this AM.  Inpatient Medications    Scheduled Meds: . aspirin EC  81 mg Oral Daily  . atorvastatin  80 mg Oral q1800  . sodium chloride flush  3 mL Intravenous Q12H   Continuous Infusions: . sodium chloride Stopped (03/27/18 0700)  . sodium chloride 1 mL/kg/hr (03/27/18 0741)  . heparin 750 Units/hr (03/27/18 0700)   PRN Meds: sodium chloride, acetaminophen, nitroGLYCERIN, ondansetron (ZOFRAN) IV, oxyCODONE, sodium chloride flush   Vital Signs    Vitals:   03/27/18 0000 03/27/18 0405 03/27/18 0555 03/27/18 0600  BP: (!) 100/43 (!) 115/58 108/60   Pulse: 65 62    Resp: (!) 9 16 (!) 26   Temp: 98.6 F (37 C)     TempSrc: Oral     SpO2: 97% 97%    Weight:    52 kg  Height:        Intake/Output Summary (Last 24 hours) at 03/27/2018 0746 Last data filed at 03/27/2018 0741 Gross per 24 hour  Intake 499.7 ml  Output 600 ml  Net -100.3 ml   Filed Weights   03/25/18 2229 03/27/18 0600  Weight: 52.2 kg 52 kg    Telemetry    SR with artifact- Personally Reviewed  ECG    10/8 SR, RBBB, LAFB- Personally Reviewed  Physical Exam   GEN: No acute distress.   Facial: hematoma under and around right eye/cheek/jaw area, progressing/resolving as expected Neck: No JVD Cardiac: RRR, no murmurs, rubs, or gallops.  Respiratory: Clear to auscultation bilaterally. GI: Soft, nontender, non-distended  MS: No edema; No deformity. Neuro:  CN intact, moves all limbs independently. Psych: Normal affect   Labs    Chemistry Recent Labs  Lab 03/25/18 2255  NA 134*  K 3.6  CL 104  CO2 23  GLUCOSE 93  BUN 10  CREATININE 0.49  CALCIUM 8.7*  PROT 6.0*  ALBUMIN 3.7  AST 28  ALT 31  ALKPHOS 64  BILITOT 0.3  GFRNONAA >60  GFRAA >60  ANIONGAP 7       Hematology Recent Labs  Lab 03/25/18 2255  WBC 6.4  RBC 4.15  HGB 13.9  HCT 42.5  MCV 102.4*  MCH 33.5  MCHC 32.7  RDW 12.0  PLT 211    Cardiac Enzymes Recent Labs  Lab 03/26/18 0550 03/26/18 1537  TROPONINI 0.46* 0.71*    Recent Labs  Lab 03/25/18 2259 03/26/18 0237  TROPIPOC 0.05 0.38*     BNPNo results for input(s): BNP, PROBNP in the last 168 hours.   DDimer No results for input(s): DDIMER in the last 168 hours.   Radiology    Ct Head Wo Contrast  Result Date: 03/26/2018 CLINICAL DATA:  Confusion after a fall. Swelling to the left lip, I, and skin tear to the forearm. Not on blood thinners. EXAM: CT HEAD WITHOUT CONTRAST CT MAXILLOFACIAL WITHOUT CONTRAST CT CERVICAL SPINE WITHOUT CONTRAST TECHNIQUE: Multidetector CT imaging of the head, cervical spine, and maxillofacial structures were performed using the standard protocol without intravenous contrast. Multiplanar CT image reconstructions of the cervical spine and maxillofacial structures were also generated. COMPARISON:  CT head 06/08/2017. MRI cervical spine 10/07/2014. FINDINGS: CT HEAD FINDINGS Brain: No evidence of acute infarction, hemorrhage, hydrocephalus, extra-axial collection or mass lesion/mass effect. Mild diffuse cerebral atrophy.  Low-attenuation changes in the deep white matter consistent small vessel ischemia. Vascular: Mild intracranial vascular calcifications. Skull: Calvarium appears intact. Other: No significant changes since previous study. CT MAXILLOFACIAL FINDINGS Osseous: Acute depressed fracture of the anterior right maxillary antral wall extending to the inferolateral wall. The orbital rims appear intact. No additional facial fractures are identified. Degenerative changes in the mandibular heads. Orbits: Globes and extraocular muscles appear intact and symmetrical. Sinuses: Opacification and air-fluid level in the right maxillary antrum with increased density consistent with hemorrhage.  Paranasal sinuses and mastoid air cells are otherwise clear. Soft tissues: Right periorbital soft tissue hematoma. No retrobulbar involvement. Soft tissue hematoma extends also over the right side of the facial bones and right mandible. CT CERVICAL SPINE FINDINGS Alignment: Straightening of the usual cervical lordosis with slight anterior subluxation of C3 on C4 and C4 on C5 as well as retrolisthesis of C5 on C6. Alignment is unchanged since previous study and is likely degenerative or postoperative. Normal alignment of the facet joints. Skull base and vertebrae: Skull base appears intact. No vertebral compression deformities. Postoperative changes with anterior plate and screw fixation and intervertebral fusion at C6-7. No destructive or expansile bone lesions. Soft tissues and spinal canal: No prevertebral soft tissue swelling. No abnormal paraspinal soft tissue mass or infiltration. Disc levels: Degenerative narrowing and endplate hypertrophic changes at C5-6. Degenerative changes in the facet joints. Upper chest: Lung apices are clear. Other: None. IMPRESSION: 1. CT HEAD: No acute intracranial abnormalities. Stable atrophy and small vessel ischemic changes. 2. CT maxillofacial: Acute depressed fracture of the anterior right maxillary antral wall with extension to the inferolateral wall of the right maxillary sinus. Associated hemorrhage in the right maxillary antrum. Right periorbital and facial soft tissue hematomas. No additional facial fractures identified. 3. CT CERVICAL SPINE: Stable alignment. No acute displaced fractures identified. Electronically Signed   By: Lucienne Capers M.D.   On: 03/26/2018 00:37   Ct Angio Chest Pe W And/or Wo Contrast  Result Date: 03/26/2018 CLINICAL DATA:  Syncope. EXAM: CT ANGIOGRAPHY CHEST WITH CONTRAST TECHNIQUE: Multidetector CT imaging of the chest was performed using the standard protocol during bolus administration of intravenous contrast. Multiplanar CT image  reconstructions and MIPs were obtained to evaluate the vascular anatomy. CONTRAST:  129mL ISOVUE-370 IOPAMIDOL (ISOVUE-370) INJECTION 76% COMPARISON:  None. FINDINGS: Cardiovascular: --Pulmonary arteries: Contrast injection is sufficient to demonstrate satisfactory opacification of the pulmonary arteries to the segmental level. There is no pulmonary embolus. The main pulmonary artery is within normal limits for size. --Aorta: Limited opacification of the aorta due to bolus timing optimization for the pulmonary arteries. Conventional 3 vessel aortic branching pattern. The aortic course and caliber are normal. There is no aortic atherosclerosis. --Heart: Normal size. No pericardial effusion. Left chest wall pacemaker leads end in the right atrium and right ventricle. Mediastinum/Nodes: No mediastinal, hilar or axillary lymphadenopathy. The visualized thyroid and thoracic esophageal course are unremarkable. Lungs/Pleura: No pulmonary nodules or masses. No pleural effusion or pneumothorax. No focal airspace consolidation. No focal pleural abnormality. Upper Abdomen: Contrast bolus timing is not optimized for evaluation of the abdominal organs. Within this limitation, the visualized organs of the upper abdomen are normal. Musculoskeletal: Pectus excavatum. Review of the MIP images confirms the above findings. IMPRESSION: No pulmonary embolus or other acute thoracic abnormality. Electronically Signed   By: Ulyses Jarred M.D.   On: 03/26/2018 04:23   Ct Cervical Spine Wo Contrast  Result Date: 03/26/2018 CLINICAL DATA:  Confusion after a fall. Swelling to  the left lip, I, and skin tear to the forearm. Not on blood thinners. EXAM: CT HEAD WITHOUT CONTRAST CT MAXILLOFACIAL WITHOUT CONTRAST CT CERVICAL SPINE WITHOUT CONTRAST TECHNIQUE: Multidetector CT imaging of the head, cervical spine, and maxillofacial structures were performed using the standard protocol without intravenous contrast. Multiplanar CT image  reconstructions of the cervical spine and maxillofacial structures were also generated. COMPARISON:  CT head 06/08/2017. MRI cervical spine 10/07/2014. FINDINGS: CT HEAD FINDINGS Brain: No evidence of acute infarction, hemorrhage, hydrocephalus, extra-axial collection or mass lesion/mass effect. Mild diffuse cerebral atrophy. Low-attenuation changes in the deep white matter consistent small vessel ischemia. Vascular: Mild intracranial vascular calcifications. Skull: Calvarium appears intact. Other: No significant changes since previous study. CT MAXILLOFACIAL FINDINGS Osseous: Acute depressed fracture of the anterior right maxillary antral wall extending to the inferolateral wall. The orbital rims appear intact. No additional facial fractures are identified. Degenerative changes in the mandibular heads. Orbits: Globes and extraocular muscles appear intact and symmetrical. Sinuses: Opacification and air-fluid level in the right maxillary antrum with increased density consistent with hemorrhage. Paranasal sinuses and mastoid air cells are otherwise clear. Soft tissues: Right periorbital soft tissue hematoma. No retrobulbar involvement. Soft tissue hematoma extends also over the right side of the facial bones and right mandible. CT CERVICAL SPINE FINDINGS Alignment: Straightening of the usual cervical lordosis with slight anterior subluxation of C3 on C4 and C4 on C5 as well as retrolisthesis of C5 on C6. Alignment is unchanged since previous study and is likely degenerative or postoperative. Normal alignment of the facet joints. Skull base and vertebrae: Skull base appears intact. No vertebral compression deformities. Postoperative changes with anterior plate and screw fixation and intervertebral fusion at C6-7. No destructive or expansile bone lesions. Soft tissues and spinal canal: No prevertebral soft tissue swelling. No abnormal paraspinal soft tissue mass or infiltration. Disc levels: Degenerative narrowing and  endplate hypertrophic changes at C5-6. Degenerative changes in the facet joints. Upper chest: Lung apices are clear. Other: None. IMPRESSION: 1. CT HEAD: No acute intracranial abnormalities. Stable atrophy and small vessel ischemic changes. 2. CT maxillofacial: Acute depressed fracture of the anterior right maxillary antral wall with extension to the inferolateral wall of the right maxillary sinus. Associated hemorrhage in the right maxillary antrum. Right periorbital and facial soft tissue hematomas. No additional facial fractures identified. 3. CT CERVICAL SPINE: Stable alignment. No acute displaced fractures identified. Electronically Signed   By: Lucienne Capers M.D.   On: 03/26/2018 00:37   Ct Maxillofacial Wo Cm  Result Date: 03/26/2018 CLINICAL DATA:  Confusion after a fall. Swelling to the left lip, I, and skin tear to the forearm. Not on blood thinners. EXAM: CT HEAD WITHOUT CONTRAST CT MAXILLOFACIAL WITHOUT CONTRAST CT CERVICAL SPINE WITHOUT CONTRAST TECHNIQUE: Multidetector CT imaging of the head, cervical spine, and maxillofacial structures were performed using the standard protocol without intravenous contrast. Multiplanar CT image reconstructions of the cervical spine and maxillofacial structures were also generated. COMPARISON:  CT head 06/08/2017. MRI cervical spine 10/07/2014. FINDINGS: CT HEAD FINDINGS Brain: No evidence of acute infarction, hemorrhage, hydrocephalus, extra-axial collection or mass lesion/mass effect. Mild diffuse cerebral atrophy. Low-attenuation changes in the deep white matter consistent small vessel ischemia. Vascular: Mild intracranial vascular calcifications. Skull: Calvarium appears intact. Other: No significant changes since previous study. CT MAXILLOFACIAL FINDINGS Osseous: Acute depressed fracture of the anterior right maxillary antral wall extending to the inferolateral wall. The orbital rims appear intact. No additional facial fractures are identified. Degenerative  changes in the mandibular  heads. Orbits: Globes and extraocular muscles appear intact and symmetrical. Sinuses: Opacification and air-fluid level in the right maxillary antrum with increased density consistent with hemorrhage. Paranasal sinuses and mastoid air cells are otherwise clear. Soft tissues: Right periorbital soft tissue hematoma. No retrobulbar involvement. Soft tissue hematoma extends also over the right side of the facial bones and right mandible. CT CERVICAL SPINE FINDINGS Alignment: Straightening of the usual cervical lordosis with slight anterior subluxation of C3 on C4 and C4 on C5 as well as retrolisthesis of C5 on C6. Alignment is unchanged since previous study and is likely degenerative or postoperative. Normal alignment of the facet joints. Skull base and vertebrae: Skull base appears intact. No vertebral compression deformities. Postoperative changes with anterior plate and screw fixation and intervertebral fusion at C6-7. No destructive or expansile bone lesions. Soft tissues and spinal canal: No prevertebral soft tissue swelling. No abnormal paraspinal soft tissue mass or infiltration. Disc levels: Degenerative narrowing and endplate hypertrophic changes at C5-6. Degenerative changes in the facet joints. Upper chest: Lung apices are clear. Other: None. IMPRESSION: 1. CT HEAD: No acute intracranial abnormalities. Stable atrophy and small vessel ischemic changes. 2. CT maxillofacial: Acute depressed fracture of the anterior right maxillary antral wall with extension to the inferolateral wall of the right maxillary sinus. Associated hemorrhage in the right maxillary antrum. Right periorbital and facial soft tissue hematomas. No additional facial fractures identified. 3. CT CERVICAL SPINE: Stable alignment. No acute displaced fractures identified. Electronically Signed   By: Lucienne Capers M.D.   On: 03/26/2018 00:37    Cardiac Studies   Echo 10/9 Study Conclusions  - Left ventricle:  Apical ballooning. Systolic function was mildly   to moderately reduced. The estimated ejection fraction was in the   range of 40% to 45%. There is akinesis of the apical anterior,   lateral, inferolateral, inferior, and apical myocardium   consistent with Takotsubo Cardiomyopathy. There was an increased   relative contribution of atrial contraction to ventricular   filling. Doppler parameters are consistent with abnormal left   ventricular relaxation (grade 1 diastolic dysfunction). - Mitral valve: There was trivial regurgitation. - Right ventricle: Pacer wire or catheter noted in right ventricle. - Right atrium: Pacer wire or catheter noted in right atrium. - Tricuspid valve: There was trivial regurgitation. - Pulmonic valve: There was mild regurgitation.  Cath pending today  Patient Profile     72 y.o. female with history of intermittent AV block s/p dual chamber MDT PPM, HTN, who presents with an episode of unheralded syncope vs. Mechanical fall. Possible postconcussive syndrome as she does not remember. Elevated troponins and abnormal echo.  Assessment & Plan    Syncope versus fall: no presyncopal symptoms, not like prior events. She denies any abnormal events prior. BAC was elevated but states she does not think she had been drinking more than usual. Has no memory of the event after until she got to the hospital, per the family she was mildly confused. Head CT unremarkable for acute process. Pacemaker interrogated in ER and reported as functioning normally. -question MI as inciting event, though not typical -may have been mechanical fall with possible concussion and resultant takotsubo's -no arrhythmias on monitor -if no clear cardiac etiology, would have neuro evaluate (?inpatient vs. Outpatient) to further evaluate  Elevated troponins, abnormal echo: NSTEMI vs. Takotsubo's. No chest pain. Cath today for definitive evaluation. If cath unremarkable, likely transfer out of ICU  today.  Maxillary fracture and hematoma: appreciate ENT evaluation. No plans  for surgical intervention. Follow up as an outpatient in 1-2 weeks.  For questions or updates, please contact [place] Please consult [email or web address] for contact info under     Signed, Buford Dresser, MD  03/27/2018, 7:46 AM

## 2018-03-27 NOTE — Progress Notes (Signed)
[facility] for Heparin  Indication: chest pain/ACS  Allergies  Allergen Reactions  . Chocolate Swelling  . Penicillins Anaphylaxis    Has patient had a PCN reaction causing immediate rash, facial/tongue/throat swelling, SOB or lightheadedness with hypotension: Yes Has patient had a PCN reaction causing severe rash involving mucus membranes or skin necrosis: No Has patient had a PCN reaction that required hospitalization: Unknown Has patient had a PCN reaction occurring within the last 10 years: No If all of the above answers are "NO", then may proceed with Cephalosporin use.     Patient Measurements: Height: 5' 2.5" (158.8 cm) Weight: 114 lb 10.2 oz (52 kg) IBW/kg (Calculated) : 51.25  Vital Signs: Temp: 98.9 F (37.2 C) (10/10 0755) Temp Source: Oral (10/10 0755) BP: 108/60 (10/10 0555) Pulse Rate: 62 (10/10 0405)  Labs: Recent Labs    03/25/18 2255 03/25/18 2323 03/26/18 0550 03/26/18 1537 03/27/18 0703  HGB 13.9  --   --   --   --   HCT 42.5  --   --   --   --   PLT 211  --   --   --   --   HEPARINUNFRC  --   --   --  0.28* 0.40  CREATININE 0.49  --   --   --   --   CKTOTAL  --  132  --   --   --   TROPONINI  --   --  0.46* 0.71*  --     Estimated Creatinine Clearance: 50.7 mL/min (by C-G formula based on SCr of 0.49 mg/dL).   Medical History: Past Medical History:  Diagnosis Date  . Anxiety    " husband passed unexpectedly in 2013" (11/29/2017)  . Arthritis    "right knee, hands, cervical & lumbar" (11/29/2017)  . DDD (degenerative disc disease), cervical   . DDD (degenerative disc disease), lumbar   . Degenerative scoliosis in adult patient   . GERD (gastroesophageal reflux disease)   . History of loop recorder- removed 11/29/17 11/30/2017  . Hypertension   . Nerve pain    CYST IN LOWER LUMBAR SPINE  . Pneumonia 1990s X 1  . PONV (postoperative nausea and vomiting)    WHEN SHE HAD NECK SURGERY ?2002, HAD SOME N/V,  NONE WITH HER OTHER SURGERIES  . Presence of permanent cardiac pacemaker 11/29/2017  . RBBB   . S/P placement of cardiac pacemaker 11/29/17 MDT 11/30/2017    Assessment: 72 y/o F to start heparin for r/o NSTEMI, CBC/renal function good, PTA meds reviewed.   Heparin level within goal at 0.40 this morning on 750 units/hr. No bleeding issues noted. Patient is scheduled to go for cath today. Will follow up with anticoagulation plans post-cath.  Goal of Therapy:  Heparin level 0.3-0.7 units/ml Monitor platelets by anticoagulation protocol: Yes   Plan:  Continue heparin drip at 750 units/hr Daily CBC/HL Monitor for bleeding  Jackson Latino, PharmD PGY1 Pharmacy Resident Phone [phone] 03/27/2018     8:37 AM

## 2018-03-27 NOTE — Progress Notes (Signed)
Site area: rt fa sheath Site Prior to Removal:  Level 0 Pressure Applied For:  20 minutes Manual:   yes Patient Status During Pull:  stable Post Pull Site:  Level  0 Post Pull Instructions Given:  yes Post Pull Pulses Present:  Rt dp palpable Dressing Applied:  Gauze and tegaderm Bedrest begins @ [id number] Comments:

## 2018-03-28 ENCOUNTER — Other Ambulatory Visit: Payer: Self-pay

## 2018-03-28 ENCOUNTER — Encounter (HOSPITAL_COMMUNITY): Payer: Self-pay

## 2018-03-28 LAB — CBC
HCT: 38.2 % (ref 36.0–46.0)
Hemoglobin: 11.8 g/dL — ABNORMAL LOW (ref 12.0–15.0)
MCH: 32.5 pg (ref 26.0–34.0)
MCHC: 30.9 g/dL (ref 30.0–36.0)
MCV: 105.2 fL — ABNORMAL HIGH (ref 80.0–100.0)
NRBC: 0 % (ref 0.0–0.2)
PLATELETS: 188 10*3/uL (ref 150–400)
RBC: 3.63 MIL/uL — ABNORMAL LOW (ref 3.87–5.11)
RDW: 12 % (ref 11.5–15.5)
WBC: 3.8 10*3/uL — ABNORMAL LOW (ref 4.0–10.5)

## 2018-03-28 LAB — BASIC METABOLIC PANEL
ANION GAP: 4 — AB (ref 5–15)
BUN: 10 mg/dL (ref 8–23)
CALCIUM: 8.6 mg/dL — AB (ref 8.9–10.3)
CO2: 27 mmol/L (ref 22–32)
Chloride: 109 mmol/L (ref 98–111)
Creatinine, Ser: 0.6 mg/dL (ref 0.44–1.00)
Glucose, Bld: 99 mg/dL (ref 70–99)
Potassium: 4.4 mmol/L (ref 3.5–5.1)
Sodium: 140 mmol/L (ref 135–145)

## 2018-03-28 LAB — GLUCOSE, CAPILLARY: Glucose-Capillary: 76 mg/dL (ref 70–99)

## 2018-03-28 MED ORDER — METOPROLOL SUCCINATE ER 25 MG PO TB24: 12.5000 mg | ORAL_TABLET | Freq: Every day | ORAL | 4 refills | Status: DC

## 2018-03-28 NOTE — Discharge Summary (Signed)
Discharge Summary    Patient ID: Karen Gomez MRN: [id number]; DOB: 27-Jan-1946  Admit date: 03/25/2018 Discharge date: 03/28/2018  Primary Care Provider: Harlan Stains, MD  Primary Cardiologist: Will Meredith Leeds, MD   Discharge Diagnoses    Active Problems:   NSTEMI (non-ST elevated myocardial infarction) [facility])   Syncope   Takotsubo cardiomyopathy  Allergies Allergies  Allergen Reactions  . Chocolate Swelling  . Penicillins Anaphylaxis    Has patient had a PCN reaction causing immediate rash, facial/tongue/throat swelling, SOB or lightheadedness with hypotension: Yes Has patient had a PCN reaction causing severe rash involving mucus membranes or skin necrosis: No Has patient had a PCN reaction that required hospitalization: Unknown Has patient had a PCN reaction occurring within the last 10 years: No If all of the above answers are "NO", then may proceed with Cephalosporin use.     Diagnostic Studies/Procedures    Cardiac catheterization 78/29/56:  LV end diastolic pressure is normal.  There is moderate left ventricular systolic dysfunction.   Normal epicardial coronary arteries with a dominant RCA circulation.  Moderately reduced LV function with apical ballooning/akinesis of the mid distal anterolateral wall apex and mid distal inferior wall suggestive of Takotsubo cardiomyopathy.  Global ejection fraction is 35 to 40%.  LVEDP 11 mmHg.  RECOMMENDATION: Guideline directed medical therapy for reduced LV function. No indication for antiplatelet therapy at this time.  Echocardiogram 03/26/18: Study Conclusions  - Left ventricle: Apical ballooning. Systolic function was mildly   to moderately reduced. The estimated ejection fraction was in the   range of 40% to 45%. There is akinesis of the apical anterior,   lateral, inferolateral, inferior, and apical myocardium   consistent with Takotsubo Cardiomyopathy. There was an increased   relative contribution of  atrial contraction to ventricular   filling. Doppler parameters are consistent with abnormal left   ventricular relaxation (grade 1 diastolic dysfunction). - Mitral valve: There was trivial regurgitation. - Right ventricle: Pacer wire or catheter noted in right ventricle. - Right atrium: Pacer wire or catheter noted in right atrium. - Tricuspid valve: There was trivial regurgitation. - Pulmonic valve: There was mild regurgitation.   History of Present Illness     72 y.o. female with history ofintermittent AV block s/p dual chamber MDT PPM, HTN, who presents with an episode of unheralded syncope vs. Mechanical fall. Possible postconcussive syndrome as she does not remember. Elevated troponins and abnormal echo.   Karen Gomez is a 72 y.o. female with history of intermittent AV block s/p dual chamber MDT PPM and HTN who presented with an episode of unheralded syncope. The patient reported feeling well on evening of admission and had no prodromal symptoms.  At some point she lost consciousness and sustained a fall. Afterwards, she called her son who came and called EMS in which they proceeded to [facility] for further evaluation. The first thing that that the patient recalled was being in the emergency room. She denied chest pain, SOB, palpitations before or after the episode. She did not report  any recent medication changes, viral illnesses, sick contacts or GI symtpoms. This is her first episode of syncope since implantation of her PPM in June.    In the ED, her initial labs were WNL, excerpt for blood EtOH level of 157. She underwent a CT of her head, spine and sinuses; this showed fracture of the right maxilla. Her 1st troponin was WNL, but her 3 hour delta was elevated at 0.38.  She then had a CT-PE  study which showed no evidence of PE. Her device interrogation showed no arrhythmias.  Upon initial cardiology interview, the patient reported significant facial pain, but no other complaints.  Hospital  Course   Over the course of her hospitalization, she underwent an echocardiogram which was found to be abnormal with an LVEF of 40-45% with akinesis of the apical anterior, lateral, inferolateral, inferior, and apical myocardium consistent with Takotsubo Cardiomyopathy and grade 1 diastolic dysfunction. Given this, she underwent a cardiac catheterization on 03/27/18 which showed normal epicardial coronary arteries with a dominant RCA circulation. She had moderate ballooning/akinesis of the mid distal anterolateral wall apex and mid distal inferior wall suggestive of Takotsubo cardiomyopathy with an estimated EF of 35-40%. Guideline directed medical therapy for reduced LV function was recommended.   Post cath creatinine is stable at 0.60. Her Hb is 11.8 and stable as well. Cath site is unremarkable.   Medication changes: -Will stop lisinopril and start Toprol-XL.  -Can add ACE-I as OP once BP more stable   Other hospital problems: 1. Elevated troponin and abnormal echo:  -NSTEMI vs. Takotsubo's with cardiac catheterization confirming  -No chest pain  2. Maxillary fracture and hematoma:  -ENT consultation/evaluation -No plans for surgical intervention  -Follow up as an outpatient in 1-2 weeks     Consultants: ENT  The patient was seen and examined by Dr. Harrell Gave who feels that she is stable and ready for discharge.  ___________  Discharge Vitals Blood pressure (!) 118/52, pulse 61, temperature 98.5 F (36.9 C), temperature source Oral, resp. rate 16, height 5' 2.5" (1.588 m), weight 52 kg, SpO2 98 %.  Filed Weights   03/25/18 2229 03/27/18 0600  Weight: 52.2 kg 52 kg   Labs & Radiologic Studies    CBC Recent Labs    03/25/18 2255 03/28/18 0216  WBC 6.4 3.8*  HGB 13.9 11.8*  HCT 42.5 38.2  MCV 102.4* 105.2*  PLT 211 536   Basic Metabolic Panel Recent Labs    03/25/18 2323 03/27/18 0709 03/28/18 0216  NA  --  140 140  K  --  4.2 4.4  CL  --  106 109  CO2  --   26 27  GLUCOSE  --  104* 99  BUN  --  6* 10  CREATININE  --  0.65 0.60  CALCIUM  --  8.9 8.6*  MG 2.2  --   --    Liver Function Tests Recent Labs    03/25/18 2255  AST 28  ALT 31  ALKPHOS 64  BILITOT 0.3  PROT 6.0*  ALBUMIN 3.7   Cardiac Enzymes Recent Labs    03/25/18 2323 03/26/18 0550 03/26/18 1537  CKTOTAL 132  --   --   TROPONINI  --  0.46* 0.71*   Fasting Lipid Panel Recent Labs    03/26/18 0550  CHOL 179  HDL 66  LDLCALC 103*  TRIG 49  CHOLHDL 2.7   __________  Ct Head Wo Contrast  Result Date: 03/26/2018 CLINICAL DATA:  Confusion after a fall. Swelling to the left lip, I, and skin tear to the forearm. Not on blood thinners. EXAM: CT HEAD WITHOUT CONTRAST CT MAXILLOFACIAL WITHOUT CONTRAST CT CERVICAL SPINE WITHOUT CONTRAST TECHNIQUE: Multidetector CT imaging of the head, cervical spine, and maxillofacial structures were performed using the standard protocol without intravenous contrast. Multiplanar CT image reconstructions of the cervical spine and maxillofacial structures were also generated. COMPARISON:  CT head 06/08/2017. MRI cervical spine 10/07/2014. FINDINGS: CT HEAD FINDINGS Brain:  No evidence of acute infarction, hemorrhage, hydrocephalus, extra-axial collection or mass lesion/mass effect. Mild diffuse cerebral atrophy. Low-attenuation changes in the deep white matter consistent small vessel ischemia. Vascular: Mild intracranial vascular calcifications. Skull: Calvarium appears intact. Other: No significant changes since previous study. CT MAXILLOFACIAL FINDINGS Osseous: Acute depressed fracture of the anterior right maxillary antral wall extending to the inferolateral wall. The orbital rims appear intact. No additional facial fractures are identified. Degenerative changes in the mandibular heads. Orbits: Globes and extraocular muscles appear intact and symmetrical. Sinuses: Opacification and air-fluid level in the right maxillary antrum with increased density  consistent with hemorrhage. Paranasal sinuses and mastoid air cells are otherwise clear. Soft tissues: Right periorbital soft tissue hematoma. No retrobulbar involvement. Soft tissue hematoma extends also over the right side of the facial bones and right mandible. CT CERVICAL SPINE FINDINGS Alignment: Straightening of the usual cervical lordosis with slight anterior subluxation of C3 on C4 and C4 on C5 as well as retrolisthesis of C5 on C6. Alignment is unchanged since previous study and is likely degenerative or postoperative. Normal alignment of the facet joints. Skull base and vertebrae: Skull base appears intact. No vertebral compression deformities. Postoperative changes with anterior plate and screw fixation and intervertebral fusion at C6-7. No destructive or expansile bone lesions. Soft tissues and spinal canal: No prevertebral soft tissue swelling. No abnormal paraspinal soft tissue mass or infiltration. Disc levels: Degenerative narrowing and endplate hypertrophic changes at C5-6. Degenerative changes in the facet joints. Upper chest: Lung apices are clear. Other: None. IMPRESSION: 1. CT HEAD: No acute intracranial abnormalities. Stable atrophy and small vessel ischemic changes. 2. CT maxillofacial: Acute depressed fracture of the anterior right maxillary antral wall with extension to the inferolateral wall of the right maxillary sinus. Associated hemorrhage in the right maxillary antrum. Right periorbital and facial soft tissue hematomas. No additional facial fractures identified. 3. CT CERVICAL SPINE: Stable alignment. No acute displaced fractures identified. Electronically Signed   By: Lucienne Capers M.D.   On: 03/26/2018 00:37   Ct Angio Chest Pe W And/or Wo Contrast  Result Date: 03/26/2018 CLINICAL DATA:  Syncope. EXAM: CT ANGIOGRAPHY CHEST WITH CONTRAST TECHNIQUE: Multidetector CT imaging of the chest was performed using the standard protocol during bolus administration of intravenous  contrast. Multiplanar CT image reconstructions and MIPs were obtained to evaluate the vascular anatomy. CONTRAST:  17mL ISOVUE-370 IOPAMIDOL (ISOVUE-370) INJECTION 76% COMPARISON:  None. FINDINGS: Cardiovascular: --Pulmonary arteries: Contrast injection is sufficient to demonstrate satisfactory opacification of the pulmonary arteries to the segmental level. There is no pulmonary embolus. The main pulmonary artery is within normal limits for size. --Aorta: Limited opacification of the aorta due to bolus timing optimization for the pulmonary arteries. Conventional 3 vessel aortic branching pattern. The aortic course and caliber are normal. There is no aortic atherosclerosis. --Heart: Normal size. No pericardial effusion. Left chest wall pacemaker leads end in the right atrium and right ventricle. Mediastinum/Nodes: No mediastinal, hilar or axillary lymphadenopathy. The visualized thyroid and thoracic esophageal course are unremarkable. Lungs/Pleura: No pulmonary nodules or masses. No pleural effusion or pneumothorax. No focal airspace consolidation. No focal pleural abnormality. Upper Abdomen: Contrast bolus timing is not optimized for evaluation of the abdominal organs. Within this limitation, the visualized organs of the upper abdomen are normal. Musculoskeletal: Pectus excavatum. Review of the MIP images confirms the above findings. IMPRESSION: No pulmonary embolus or other acute thoracic abnormality. Electronically Signed   By: Ulyses Jarred M.D.   On: 03/26/2018 04:23   Ct  Cervical Spine Wo Contrast  Result Date: 03/26/2018 CLINICAL DATA:  Confusion after a fall. Swelling to the left lip, I, and skin tear to the forearm. Not on blood thinners. EXAM: CT HEAD WITHOUT CONTRAST CT MAXILLOFACIAL WITHOUT CONTRAST CT CERVICAL SPINE WITHOUT CONTRAST TECHNIQUE: Multidetector CT imaging of the head, cervical spine, and maxillofacial structures were performed using the standard protocol without intravenous contrast.  Multiplanar CT image reconstructions of the cervical spine and maxillofacial structures were also generated. COMPARISON:  CT head 06/08/2017. MRI cervical spine 10/07/2014. FINDINGS: CT HEAD FINDINGS Brain: No evidence of acute infarction, hemorrhage, hydrocephalus, extra-axial collection or mass lesion/mass effect. Mild diffuse cerebral atrophy. Low-attenuation changes in the deep white matter consistent small vessel ischemia. Vascular: Mild intracranial vascular calcifications. Skull: Calvarium appears intact. Other: No significant changes since previous study. CT MAXILLOFACIAL FINDINGS Osseous: Acute depressed fracture of the anterior right maxillary antral wall extending to the inferolateral wall. The orbital rims appear intact. No additional facial fractures are identified. Degenerative changes in the mandibular heads. Orbits: Globes and extraocular muscles appear intact and symmetrical. Sinuses: Opacification and air-fluid level in the right maxillary antrum with increased density consistent with hemorrhage. Paranasal sinuses and mastoid air cells are otherwise clear. Soft tissues: Right periorbital soft tissue hematoma. No retrobulbar involvement. Soft tissue hematoma extends also over the right side of the facial bones and right mandible. CT CERVICAL SPINE FINDINGS Alignment: Straightening of the usual cervical lordosis with slight anterior subluxation of C3 on C4 and C4 on C5 as well as retrolisthesis of C5 on C6. Alignment is unchanged since previous study and is likely degenerative or postoperative. Normal alignment of the facet joints. Skull base and vertebrae: Skull base appears intact. No vertebral compression deformities. Postoperative changes with anterior plate and screw fixation and intervertebral fusion at C6-7. No destructive or expansile bone lesions. Soft tissues and spinal canal: No prevertebral soft tissue swelling. No abnormal paraspinal soft tissue mass or infiltration. Disc levels:  Degenerative narrowing and endplate hypertrophic changes at C5-6. Degenerative changes in the facet joints. Upper chest: Lung apices are clear. Other: None. IMPRESSION: 1. CT HEAD: No acute intracranial abnormalities. Stable atrophy and small vessel ischemic changes. 2. CT maxillofacial: Acute depressed fracture of the anterior right maxillary antral wall with extension to the inferolateral wall of the right maxillary sinus. Associated hemorrhage in the right maxillary antrum. Right periorbital and facial soft tissue hematomas. No additional facial fractures identified. 3. CT CERVICAL SPINE: Stable alignment. No acute displaced fractures identified. Electronically Signed   By: Lucienne Capers M.D.   On: 03/26/2018 00:37   Ct Maxillofacial Wo Cm  Result Date: 03/26/2018 CLINICAL DATA:  Confusion after a fall. Swelling to the left lip, I, and skin tear to the forearm. Not on blood thinners. EXAM: CT HEAD WITHOUT CONTRAST CT MAXILLOFACIAL WITHOUT CONTRAST CT CERVICAL SPINE WITHOUT CONTRAST TECHNIQUE: Multidetector CT imaging of the head, cervical spine, and maxillofacial structures were performed using the standard protocol without intravenous contrast. Multiplanar CT image reconstructions of the cervical spine and maxillofacial structures were also generated. COMPARISON:  CT head 06/08/2017. MRI cervical spine 10/07/2014. FINDINGS: CT HEAD FINDINGS Brain: No evidence of acute infarction, hemorrhage, hydrocephalus, extra-axial collection or mass lesion/mass effect. Mild diffuse cerebral atrophy. Low-attenuation changes in the deep white matter consistent small vessel ischemia. Vascular: Mild intracranial vascular calcifications. Skull: Calvarium appears intact. Other: No significant changes since previous study. CT MAXILLOFACIAL FINDINGS Osseous: Acute depressed fracture of the anterior right maxillary antral wall extending to the inferolateral wall.  The orbital rims appear intact. No additional facial fractures  are identified. Degenerative changes in the mandibular heads. Orbits: Globes and extraocular muscles appear intact and symmetrical. Sinuses: Opacification and air-fluid level in the right maxillary antrum with increased density consistent with hemorrhage. Paranasal sinuses and mastoid air cells are otherwise clear. Soft tissues: Right periorbital soft tissue hematoma. No retrobulbar involvement. Soft tissue hematoma extends also over the right side of the facial bones and right mandible. CT CERVICAL SPINE FINDINGS Alignment: Straightening of the usual cervical lordosis with slight anterior subluxation of C3 on C4 and C4 on C5 as well as retrolisthesis of C5 on C6. Alignment is unchanged since previous study and is likely degenerative or postoperative. Normal alignment of the facet joints. Skull base and vertebrae: Skull base appears intact. No vertebral compression deformities. Postoperative changes with anterior plate and screw fixation and intervertebral fusion at C6-7. No destructive or expansile bone lesions. Soft tissues and spinal canal: No prevertebral soft tissue swelling. No abnormal paraspinal soft tissue mass or infiltration. Disc levels: Degenerative narrowing and endplate hypertrophic changes at C5-6. Degenerative changes in the facet joints. Upper chest: Lung apices are clear. Other: None. IMPRESSION: 1. CT HEAD: No acute intracranial abnormalities. Stable atrophy and small vessel ischemic changes. 2. CT maxillofacial: Acute depressed fracture of the anterior right maxillary antral wall with extension to the inferolateral wall of the right maxillary sinus. Associated hemorrhage in the right maxillary antrum. Right periorbital and facial soft tissue hematomas. No additional facial fractures identified. 3. CT CERVICAL SPINE: Stable alignment. No acute displaced fractures identified. Electronically Signed   By: Lucienne Capers M.D.   On: 03/26/2018 00:37   Disposition   Pt is being discharged home  today in good condition.  Follow-up Plans & Appointments    Follow-up Information    Constance Haw, MD Follow up on 04/08/2018.   Specialty:  Cardiology Why:  Your follow up appointment is on 04/08/18 at 345pm with Dr. Curt Bears.  Contact information: [address] [place] [address] [phone]          Discharge Instructions    Call MD for:  difficulty breathing, headache or visual disturbances   Complete by:  As directed    Call MD for:  persistant dizziness or light-headedness   Complete by:  As directed    Call MD for:  persistant nausea and vomiting   Complete by:  As directed    Call MD for:  redness, tenderness, or signs of infection (pain, swelling, redness, odor or green/yellow discharge around incision site)   Complete by:  As directed    Call MD for:  severe uncontrolled pain   Complete by:  As directed    Call MD for:  temperature >100.4   Complete by:  As directed    Diet - low sodium heart healthy   Complete by:  As directed    Discharge instructions   Complete by:  As directed    No driving for 3 days. No lifting over 5 lbs for 1 week. No sexual activity for 1 week. Keep procedure site clean & dry. If you notice increased pain, swelling, bleeding or pus, call/return!  You may shower, but no soaking baths/hot tubs/pools for 1 week.   If you notice any bleeding such as blood in stool, black tarry stools, blood in urine, nosebleeds or any other unusual bleeding, call your doctor immediately.  Please follow up with ENT for facial fracture   Increase activity  slowly   Complete by:  As directed      Discharge Medications   Allergies as of 03/28/2018      Reactions   Chocolate Swelling   Penicillins Anaphylaxis   Has patient had a PCN reaction causing immediate rash, facial/tongue/throat swelling, SOB or lightheadedness with hypotension: Yes Has patient had a PCN reaction causing severe rash involving mucus membranes or skin necrosis:  No Has patient had a PCN reaction that required hospitalization: Unknown Has patient had a PCN reaction occurring within the last 10 years: No If all of the above answers are "NO", then may proceed with Cephalosporin use.      Medication List    STOP taking these medications   lisinopril 10 MG tablet Commonly known as:  PRINIVIL,ZESTRIL     TAKE these medications   calcium-vitamin D 500-200 MG-UNIT tablet Commonly known as:  OSCAL WITH D Take 1 tablet by mouth daily.   cholecalciferol 1000 units tablet Commonly known as:  VITAMIN D Take 1,000 Units by mouth daily.   escitalopram 5 MG tablet Commonly known as:  LEXAPRO Take 5 mg by mouth daily.   estradiol 0.5 MG tablet Commonly known as:  ESTRACE Take 0.5 mg by mouth daily.   gabapentin 300 MG capsule Commonly known as:  NEURONTIN Take 300 mg by mouth at bedtime.   HYDROcodone-acetaminophen 10-325 MG tablet Commonly known as:  NORCO Take 0.5 tablets by mouth daily as needed for moderate pain.   Magnesium 250 MG Tabs Take 250 mg by mouth daily.   metoprolol succinate 25 MG 24 hr tablet Commonly known as:  TOPROL-XL Take 0.5 tablets (12.5 mg total) by mouth daily. Start taking on:  03/29/2018   multivitamin with minerals Tabs tablet Take 1 tablet by mouth daily. Centrum Silver   progesterone 100 MG capsule Commonly known as:  PROMETRIUM Take 100 mg by mouth at bedtime.        Acute coronary syndrome (MI, NSTEMI, STEMI, etc) this admission?:  No.  The elevated Troponin was due to the acute medical illness or demand ischemia.    Outstanding Labs/Studies   Can titrate medications for cardiomyopathy as BP allows.   Duration of Discharge Encounter   Greater than 30 minutes including physician time.  Signed, Kathyrn Drown, NP 03/28/2018, 1:00 PM

## 2018-03-28 NOTE — Plan of Care (Signed)
  Problem: Education: Goal: Knowledge of General Education information will improve Description Including pain rating scale, medication(s)/side effects and non-pharmacologic comfort measures Outcome: Progressing   Problem: Clinical Measurements: Goal: Ability to maintain clinical measurements within normal limits will improve Outcome: Progressing   Problem: Coping: Goal: Level of anxiety will decrease Outcome: Progressing   Problem: Elimination: Goal: Will not experience complications related to bowel motility Outcome: Progressing    Problem: Pain Managment: Goal: General experience of comfort will improve Outcome: Progressing  PRN Oxy, scheduled Tylenol Problem: Safety: Goal: Ability to remain free from injury will improve Outcome: Progressing

## 2018-03-31 NOTE — Consult Note (Signed)
            [facility] CM Primary Care Navigator  03/31/2018  Karen Gomez 1946/01/19 [id number]   Attempt to seepatient at the bedside to identify possible discharge needs butshe wasalreadydischargedhometowards the weekend.  Per MD note,patientpresented with an episode of syncopevs. mechanical fall, possible post concussive syndrome with elevated troponin and abnormal echo.  Primary care provider's office is listed as providing transition of care (TOC) follow-up.  Patient has discharge instruction to follow-up withcardiology on 04/08/18 and ENT outpatient follow-up in 1- 2 weeks.   For additional questions please contact:  Edwena Felty A. Tamura Lasky, BSN, RN-BC [facility] PRIMARY CARE Navigator Cell: [phone]

## 2018-04-08 ENCOUNTER — Ambulatory Visit: Payer: Medicare PPO | Admitting: Cardiology

## 2018-04-08 ENCOUNTER — Encounter: Payer: Self-pay | Admitting: Cardiology

## 2018-04-08 VITALS — BP 122/62 | HR 71 | Ht 62.5 in | Wt 114.0 lb

## 2018-04-08 DIAGNOSIS — I5181 Takotsubo syndrome: Secondary | ICD-10-CM | POA: Diagnosis not present

## 2018-04-08 DIAGNOSIS — I442 Atrioventricular block, complete: Secondary | ICD-10-CM

## 2018-04-08 NOTE — Progress Notes (Signed)
Electrophysiology Office Note   Date:  04/08/2018   ID:  Karen Gomez September 05, 1945, MRN [id number]  PCP:  Harlan Stains, MD Primary Electrophysiologist:  Will Meredith Leeds, MD    No chief complaint on file.    History of Present Illness: Karen Gomez is a 72 y.o. female who presents today for electrophysiology evaluation.   She has a history of hypertension.   She has had multiple episodes of syncope.  She was waiting in the line to see Gaylyn Rong at this time.  She had been waiting in line for approximately 20 minutes prior to the episode.  Per family, she slumped down and sustained no trauma.  She was unconscious for 5-10 seconds.  No seizure-like activity, loss of bowel bladder function or tongue biting.  She was not disoriented when she awoke.  A few days later, she had multiple episodes of syncope.  The first occurred when she was standing for multiple hours while making with her grandchildren.  She had 2 other syncopal episodes while standing.  She had one while sitting.  Prior to whole of her episodes, she had weakness, and dizziness.  She felt well after all of her episodes.  A Medtronic dual-chamber pacemaker implanted 11/29/2017.  She was recently discharged from the hospital on 03/27/2018 after a fall.  It was thought that the fall was due to a mechanical issue, but on echo her ejection fraction was 40 to 45%.  Left heart catheterization showed normal coronary arteries and a possible takotsubo's cardiomyopathy pattern on her LV gram.  Today, denies symptoms of palpitations, chest pain, shortness of breath, orthopnea, PND, lower extremity edema, claudication, dizziness, presyncope, syncope, bleeding, or neurologic sequela. The patient is tolerating medications without difficulties.     Past Medical History:  Diagnosis Date  . Anxiety    " husband passed unexpectedly in 2013" (11/29/2017)  . Arthritis    "right knee, hands, cervical & lumbar" (11/29/2017)  . DDD  (degenerative disc disease), cervical   . DDD (degenerative disc disease), lumbar   . Degenerative scoliosis in adult patient   . GERD (gastroesophageal reflux disease)   . History of loop recorder- removed 11/29/17 11/30/2017  . Hypertension   . Nerve pain    CYST IN LOWER LUMBAR SPINE  . Pneumonia 1990s X 1  . PONV (postoperative nausea and vomiting)    WHEN SHE HAD NECK SURGERY ?2002, HAD SOME N/V, NONE WITH HER OTHER SURGERIES  . Presence of permanent cardiac pacemaker 11/29/2017  . RBBB   . S/P placement of cardiac pacemaker 11/29/17 MDT 11/30/2017   Past Surgical History:  Procedure Laterality Date  . ANTERIOR CERVICAL DECOMP/DISCECTOMY FUSION  ~ 2002   PLACEMENT OF TITANIUM PLATE  . BACK SURGERY    . CATARACT EXTRACTION W/ INTRAOCULAR LENS  IMPLANT, BILATERAL    . CESAREAN SECTION  1973; 1977  . DILATION AND CURETTAGE OF UTERUS     PART OF UTERINE POLYP REMOVAL  . INSERT / REPLACE / REMOVE PACEMAKER  11/29/2017  . LEFT HEART CATH AND CORONARY ANGIOGRAPHY N/A 03/27/2018   Procedure: LEFT HEART CATH AND CORONARY ANGIOGRAPHY;  Surgeon: Troy Sine, MD;  Location: [facility];  Service: Cardiovascular;  Laterality: N/A;  . LOOP RECORDER INSERTION N/A 07/09/2017   Procedure: LOOP RECORDER INSERTION;  Surgeon: Constance Haw, MD;  Location: [facility];  Service: Cardiovascular;  Laterality: N/A;  . LOOP RECORDER REMOVAL  11/29/2017  . LOOP RECORDER REMOVAL  N/A 11/29/2017   Procedure: LOOP RECORDER REMOVAL;  Surgeon: Constance Haw, MD;  Location: [facility];  Service: Cardiovascular;  Laterality: N/A;  . PACEMAKER IMPLANT N/A 11/29/2017   Procedure: PACEMAKER IMPLANT;  Surgeon: Constance Haw, MD;  Location: [facility];  Service: Cardiovascular;  Laterality: N/A;  . REVERSE SHOULDER ARTHROPLASTY Right 06/28/2016   Procedure: REVERSE SHOULDER ARTHROPLASTY;  Surgeon: Justice Britain, MD;  Location: [facility];  Service: Orthopedics;  Laterality:  Right;  . TONSILLECTOMY  1940s  . UTERINE POLYPS       Current Outpatient Medications  Medication Sig Dispense Refill  . calcium-vitamin D (OSCAL WITH D) 500-200 MG-UNIT tablet Take 1 tablet by mouth daily.    . cholecalciferol (VITAMIN D) 1000 units tablet Take 1,000 Units by mouth daily.    Marland Kitchen escitalopram (LEXAPRO) 5 MG tablet Take 5 mg by mouth daily.    Marland Kitchen estradiol (ESTRACE) 0.5 MG tablet Take 0.5 mg by mouth daily.    Marland Kitchen gabapentin (NEURONTIN) 300 MG capsule Take 300 mg by mouth at bedtime.     Marland Kitchen HYDROcodone-acetaminophen (NORCO) 10-325 MG tablet Take 0.5 tablets by mouth daily as needed for moderate pain.    . Magnesium 250 MG TABS Take 250 mg by mouth daily.    . metoprolol succinate (TOPROL-XL) 25 MG 24 hr tablet Take 0.5 tablets (12.5 mg total) by mouth daily. 90 tablet 4  . Multiple Vitamin (MULTIVITAMIN WITH MINERALS) TABS tablet Take 1 tablet by mouth daily. Centrum Silver    . progesterone (PROMETRIUM) 100 MG capsule Take 100 mg by mouth at bedtime.      No current facility-administered medications for this visit.     Allergies:   Chocolate and Penicillins   Social History:  The patient  reports that she has never smoked. She has never used smokeless tobacco. She reports that she drinks about 14.0 standard drinks of alcohol per week. She reports that she does not use drugs.   Family History:  The patient's family history includes Dementia in her mother; Heart disease in her brother and father.   ROS:  Please see the history of present illness.   Otherwise, review of systems is positive for facial swelling, headaches, passing out.   All other systems are reviewed and negative.   PHYSICAL EXAM: VS:  BP 122/62   Pulse 71   Ht 5' 2.5" (1.588 m)   Wt 114 lb (51.7 kg)   SpO2 98%   BMI 20.52 kg/m  , BMI Body mass index is 20.52 kg/m. GEN: Well nourished, well developed, in no acute distress  HEENT: normal  Neck: no JVD, carotid bruits, or masses Cardiac: RRR; no  murmurs, rubs, or gallops,no edema  Respiratory:  clear to auscultation bilaterally, normal work of breathing GI: soft, nontender, nondistended, + BS MS: no deformity or atrophy  Skin: warm and dry, device site well healed Neuro:  Strength and sensation are intact Psych: euthymic mood, full affect  EKG:  EKG is not ordered today. Personal review of the ekg ordered 03/28/18 shows SR, RBBB, LAFB  Personal review of the device interrogation today. Results in [place]: 06/08/2017: TSH 3.230 03/25/2018: ALT 31; Magnesium 2.2 03/28/2018: BUN 10; Creatinine, Ser 0.60; Hemoglobin 11.8; Platelets 188; Potassium 4.4; Sodium 140    Lipid Panel     Component Value Date/Time   CHOL 179 03/26/2018 0550   TRIG 49 03/26/2018 0550   HDL 66 03/26/2018 0550   CHOLHDL  2.7 03/26/2018 0550   VLDL 10 03/26/2018 0550   LDLCALC 103 (H) 03/26/2018 0550     Wt Readings from Last 3 Encounters:  04/08/18 114 lb (51.7 kg)  03/27/18 114 lb 10.2 oz (52 kg)  03/11/18 115 lb (52.2 kg)      Other studies Reviewed: Additional studies/ records that were reviewed today include: LHC 03/27/18 Review of the above records today demonstrates:   LV end diastolic pressure is normal.  There is moderate left ventricular systolic dysfunction.   Normal epicardial coronary arteries with a dominant RCA circulation.  Moderately reduced LV function with apical ballooning/akinesis of the mid distal anterolateral wall apex and mid distal inferior wall suggestive of Takotsubo cardiomyopathy.  Global ejection fraction is 35 to 40%.  LVEDP 11 mmHg.  TTE 03/26/18 - Left ventricle: Apical ballooning. Systolic function was mildly   to moderately reduced. The estimated ejection fraction was in the   range of 40% to 45%. There is akinesis of the apical anterior,   lateral, inferolateral, inferior, and apical myocardium   consistent with Takotsubo Cardiomyopathy. There was an increased   relative contribution of  atrial contraction to ventricular   filling. Doppler parameters are consistent with abnormal left   ventricular relaxation (grade 1 diastolic dysfunction). - Mitral valve: There was trivial regurgitation. - Right ventricle: Pacer wire or catheter noted in right ventricle. - Right atrium: Pacer wire or catheter noted in right atrium. - Tricuspid valve: There was trivial regurgitation. - Pulmonic valve: There was mild regurgitation.   ASSESSMENT AND PLAN:  1.  Syncope with complete heart block: Holter monitor showed intermittent complete heart block and she is now supposed Medtronic dual-chamber pacemaker implanted 11/29/2017.  Device functioning appropriately.  No change.    2.  Right bundle branch block, left anterior fascicular block: Chronic and on EKG from her hospitalization.  No changes.  3.  Takotsubo's cardiomyopathy: No arrhythmias seen to explain her episode of syncope.  I will discuss her episode of syncope and driving with her cardiologist that saw her in the hospital.  At this point it is unclear to me as to the cause of her syncope.  She is currently on therapy for her cardiomyopathy.  We will repeat an echo in 3 months.  Current medicines are reviewed at length with the patient today.   The patient does not have concerns regarding her medicines.  The following changes were made today: none  Labs/ tests ordered today include:  Orders Placed This Encounter  Procedures  . ECHOCARDIOGRAM COMPLETE     Disposition:   FU with Will Camnitz 6 months  Signed, Will Meredith Leeds, MD  04/08/2018 4:31 PM     [place] [address] [phone] (office) [phone] (fa

## 2018-04-08 NOTE — Patient Instructions (Signed)
Medication Instructions:  Your physician recommends that you continue on your current medications as directed. Please refer to the Current Medication list given to you today.  If you need a refill on your cardiac medications before your next appointment, please call your pharmacy.   Lab work: None ordered  Testing/Procedures: Your physician has requested that you have an echocardiogram in 3 months. Echocardiography is a painless test that uses sound waves to create images of your heart. It provides your doctor with information about the size and shape of your heart and how well your heart's chambers and valves are working. This procedure takes approximately one hour. There are no restrictions for this procedure.   Follow-Up: At [facility], you and your health needs are our priority.  As part of our continuing mission to provide you with exceptional heart care, we have created designated Provider Care Teams.  These Care Teams include your primary Cardiologist (physician) and Advanced Practice Providers (APPs -  Physician Assistants and Nurse Practitioners) who all work together to provide you with the care you need, when you need it. You will need a follow up appointment in 6 months.  Please call our office 2 months in advance to schedule this appointment.  You may see Will Meredith Leeds, MD or one of the following Advanced Practice Providers on your designated Care Team:   Chanetta Marshall, NP . Tommye Standard, PA-C  Thank you for choosing [facility]!!   [name], RN [phone]

## 2018-04-09 LAB — CUP PACEART INCLINIC DEVICE CHECK
Date Time Interrogation Session: 20191023085654
Implantable Lead Implant Date: 20190614
Implantable Lead Implant Date: 20190614
Implantable Lead Location: 753860
Implantable Lead Model: 5076
Implantable Lead Model: 5076
Implantable Pulse Generator Implant Date: 20190614
MDC IDC LEAD LOCATION: 753859

## 2018-06-13 ENCOUNTER — Encounter: Payer: Self-pay | Admitting: Cardiology

## 2018-06-16 ENCOUNTER — Telehealth: Payer: Self-pay | Admitting: Cardiology

## 2018-06-16 ENCOUNTER — Ambulatory Visit (INDEPENDENT_AMBULATORY_CARE_PROVIDER_SITE_OTHER): Payer: Medicare PPO

## 2018-06-16 DIAGNOSIS — I442 Atrioventricular block, complete: Secondary | ICD-10-CM

## 2018-06-16 NOTE — Telephone Encounter (Signed)
Spoke w/ pt and informed her that her 06/10/2018 remote was not received but once we receive a manual transmission that will take care of the no show. Instructed pt how to send a manual transmission.

## 2018-06-20 ENCOUNTER — Encounter: Payer: Self-pay | Admitting: Cardiology

## 2018-06-20 NOTE — Progress Notes (Signed)
Remote pacemaker transmission.   

## 2018-06-21 LAB — CUP PACEART REMOTE DEVICE CHECK
Battery Voltage: 3.14 V
Brady Statistic AP VP Percent: 0.01 %
Brady Statistic AP VS Percent: 19.3 %
Brady Statistic AS VS Percent: 80.65 %
Brady Statistic RA Percent Paced: 19.28 %
Brady Statistic RV Percent Paced: 0.05 %
Implantable Lead Location: 753859
Implantable Lead Location: 753860
Implantable Lead Model: 5076
Lead Channel Impedance Value: 247 Ohm
Lead Channel Setting Pacing Amplitude: 2.5 V
Lead Channel Setting Pacing Pulse Width: 0.4 ms
Lead Channel Setting Sensing Sensitivity: 0.9 mV
MDC IDC LEAD IMPLANT DT: 20190614
MDC IDC LEAD IMPLANT DT: 20190614
MDC IDC MSMT BATTERY REMAINING LONGEVITY: 175 mo
MDC IDC MSMT LEADCHNL RA IMPEDANCE VALUE: 475 Ohm
MDC IDC MSMT LEADCHNL RA PACING THRESHOLD AMPLITUDE: 1.625 V
MDC IDC MSMT LEADCHNL RA PACING THRESHOLD PULSEWIDTH: 0.4 ms
MDC IDC MSMT LEADCHNL RA SENSING INTR AMPL: 3.375 mV
MDC IDC MSMT LEADCHNL RA SENSING INTR AMPL: 3.375 mV
MDC IDC MSMT LEADCHNL RV IMPEDANCE VALUE: 323 Ohm
MDC IDC MSMT LEADCHNL RV IMPEDANCE VALUE: 418 Ohm
MDC IDC MSMT LEADCHNL RV PACING THRESHOLD AMPLITUDE: 0.5 V
MDC IDC MSMT LEADCHNL RV PACING THRESHOLD PULSEWIDTH: 0.4 ms
MDC IDC MSMT LEADCHNL RV SENSING INTR AMPL: 13 mV
MDC IDC MSMT LEADCHNL RV SENSING INTR AMPL: 13 mV
MDC IDC PG IMPLANT DT: 20190614
MDC IDC SESS DTM: 20191230200248
MDC IDC SET LEADCHNL RA PACING AMPLITUDE: 3.25 V
MDC IDC STAT BRADY AS VP PERCENT: 0.04 %

## 2018-06-24 DIAGNOSIS — E441 Mild protein-calorie malnutrition: Secondary | ICD-10-CM | POA: Diagnosis not present

## 2018-06-24 DIAGNOSIS — R7301 Impaired fasting glucose: Secondary | ICD-10-CM | POA: Diagnosis not present

## 2018-06-24 DIAGNOSIS — I10 Essential (primary) hypertension: Secondary | ICD-10-CM | POA: Diagnosis not present

## 2018-06-24 DIAGNOSIS — R69 Illness, unspecified: Secondary | ICD-10-CM | POA: Diagnosis not present

## 2018-07-03 ENCOUNTER — Other Ambulatory Visit: Payer: Self-pay

## 2018-07-03 ENCOUNTER — Ambulatory Visit (HOSPITAL_COMMUNITY): Payer: Medicare HMO | Attending: Cardiovascular Disease

## 2018-07-03 DIAGNOSIS — I5181 Takotsubo syndrome: Secondary | ICD-10-CM

## 2018-07-07 ENCOUNTER — Telehealth: Payer: Self-pay | Admitting: Cardiology

## 2018-07-07 NOTE — Telephone Encounter (Signed)
New Message        Patient returned your call. She would for you to call her on the # in the message please and thank you,

## 2018-07-07 NOTE — Telephone Encounter (Signed)
Pt aware I will have Dr. Curt Bears review her chart to see if switching back is ok. She understands I will follow up in the next day/two.

## 2018-07-07 NOTE — Telephone Encounter (Signed)
Patient returned my call about her echocardiogram results. She is aware of results and verbalized understanding. She did have a medication question, she is currently on Metoprolol 25MG ; 1/2 tablet once a day and she feels like it is making her very fatigued and depressed. She would like to go back on Lisinopril 10MG  once a day.

## 2018-07-08 MED ORDER — LISINOPRIL 10 MG PO TABS: 10.0000 mg | ORAL_TABLET | Freq: Every day | ORAL | 3 refills | Status: DC

## 2018-07-08 NOTE — Telephone Encounter (Signed)
Pt advised to stop Toprol and restart Lisinopril 10 mg daily, per Dr. Curt Bears. Pt very appreciative and agreeable to plan.

## 2018-07-31 DIAGNOSIS — E860 Dehydration: Secondary | ICD-10-CM | POA: Diagnosis not present

## 2018-07-31 DIAGNOSIS — I959 Hypotension, unspecified: Secondary | ICD-10-CM | POA: Diagnosis not present

## 2018-07-31 DIAGNOSIS — R197 Diarrhea, unspecified: Secondary | ICD-10-CM | POA: Diagnosis not present

## 2018-08-01 DIAGNOSIS — R197 Diarrhea, unspecified: Secondary | ICD-10-CM | POA: Diagnosis not present

## 2018-08-13 DIAGNOSIS — R197 Diarrhea, unspecified: Secondary | ICD-10-CM | POA: Diagnosis not present

## 2018-08-14 DIAGNOSIS — R197 Diarrhea, unspecified: Secondary | ICD-10-CM | POA: Diagnosis not present

## 2018-08-28 DIAGNOSIS — R197 Diarrhea, unspecified: Secondary | ICD-10-CM | POA: Diagnosis not present

## 2018-09-15 ENCOUNTER — Other Ambulatory Visit: Payer: Self-pay

## 2018-09-15 ENCOUNTER — Ambulatory Visit (INDEPENDENT_AMBULATORY_CARE_PROVIDER_SITE_OTHER): Payer: Medicare HMO | Admitting: *Deleted

## 2018-09-15 DIAGNOSIS — R55 Syncope and collapse: Secondary | ICD-10-CM

## 2018-09-15 DIAGNOSIS — I442 Atrioventricular block, complete: Secondary | ICD-10-CM

## 2018-09-15 LAB — CUP PACEART REMOTE DEVICE CHECK
Brady Statistic AP VP Percent: 0.01 %
Brady Statistic AP VS Percent: 10.99 %
Brady Statistic AS VP Percent: 0.2 %
Brady Statistic AS VS Percent: 88.79 %
Implantable Lead Implant Date: 20190614
Implantable Lead Location: 753859
Implantable Lead Model: 5076
Lead Channel Impedance Value: 247 Ohm
Lead Channel Impedance Value: 323 Ohm
Lead Channel Impedance Value: 494 Ohm
Lead Channel Pacing Threshold Pulse Width: 0.4 ms
Lead Channel Sensing Intrinsic Amplitude: 12.5 mV
Lead Channel Sensing Intrinsic Amplitude: 12.5 mV
Lead Channel Sensing Intrinsic Amplitude: 3.625 mV
Lead Channel Setting Pacing Amplitude: 2.5 V
Lead Channel Setting Pacing Amplitude: 3.25 V
Lead Channel Setting Pacing Pulse Width: 0.4 ms
MDC IDC LEAD IMPLANT DT: 20190614
MDC IDC LEAD LOCATION: 753860
MDC IDC MSMT BATTERY REMAINING LONGEVITY: 171 mo
MDC IDC MSMT BATTERY VOLTAGE: 3.11 V
MDC IDC MSMT LEADCHNL RA PACING THRESHOLD AMPLITUDE: 1.625 V
MDC IDC MSMT LEADCHNL RA PACING THRESHOLD PULSEWIDTH: 0.4 ms
MDC IDC MSMT LEADCHNL RA SENSING INTR AMPL: 3.625 mV
MDC IDC MSMT LEADCHNL RV IMPEDANCE VALUE: 380 Ohm
MDC IDC MSMT LEADCHNL RV PACING THRESHOLD AMPLITUDE: 0.375 V
MDC IDC PG IMPLANT DT: 20190614
MDC IDC SESS DTM: 20200330104845
MDC IDC SET LEADCHNL RV SENSING SENSITIVITY: 0.9 mV
MDC IDC STAT BRADY RA PERCENT PACED: 10.99 %
MDC IDC STAT BRADY RV PERCENT PACED: 0.21 %

## 2018-09-23 ENCOUNTER — Telehealth: Payer: Self-pay | Admitting: *Deleted

## 2018-09-23 NOTE — Progress Notes (Signed)
Remote pacemaker transmission.   

## 2018-09-23 NOTE — Telephone Encounter (Signed)
Called patient to let them know due to recent COVID19 CDC and Health Department Protocols, we are not seeing patients in the office. We are instead seeing if they would like to schedule this appointment as a WebEx Virtual Appointment VIA Smartphone or Laptop. Patient is aware if they decide to reschedule this appointment, they may not be seen or scheduled for the next 4-6 months. Patient is agreeable to WebEx. WebEx Virtual Visit Information sent VIA MyChart Message.  Patient will call us back if they are having issues uploading the app. Patient was advised to have pencil/pen and paper ready to take notes before, during and after the Virtual visit, to have BP, HR, and recent Wt, and any other concerns and questions ready prior to start of appointment.  Patient advised appointment times are set like a regular schedule and will begin possibly 10 minutes before set time. Appointment is not a floating schedule for that day.   

## 2018-09-30 ENCOUNTER — Telehealth (INDEPENDENT_AMBULATORY_CARE_PROVIDER_SITE_OTHER): Payer: Medicare HMO | Admitting: Cardiology

## 2018-09-30 ENCOUNTER — Encounter: Payer: Self-pay | Admitting: Cardiology

## 2018-09-30 ENCOUNTER — Other Ambulatory Visit: Payer: Self-pay

## 2018-09-30 DIAGNOSIS — I442 Atrioventricular block, complete: Secondary | ICD-10-CM | POA: Diagnosis not present

## 2018-09-30 DIAGNOSIS — I452 Bifascicular block: Secondary | ICD-10-CM | POA: Diagnosis not present

## 2018-09-30 DIAGNOSIS — Z79899 Other long term (current) drug therapy: Secondary | ICD-10-CM | POA: Diagnosis not present

## 2018-09-30 DIAGNOSIS — I5181 Takotsubo syndrome: Secondary | ICD-10-CM | POA: Diagnosis not present

## 2018-09-30 DIAGNOSIS — Z950 Presence of cardiac pacemaker: Secondary | ICD-10-CM | POA: Diagnosis not present

## 2018-09-30 NOTE — Progress Notes (Signed)
Electrophysiology TeleHealth Note   Due to national recommendations of social distancing due to COVID 19, an audio/video telehealth visit is felt to be most appropriate for this patient at this time.  See MyChart message from today for the patient's consent to telehealth for [facility].   Date:  09/30/2018   ID:  [name], DOB 1946/06/17, MRN [id number]  Location: patient's home  Provider location: [address] [place]  Follow-up visit  PCP:  Harlan Stains, MD  Cardiologist:  Amanat Hackel Meredith Leeds, MD  Electrophysiologist:  Dr Curt Bears  Chief Complaint:  CHB  History of Present Illness:    Karen Gomez is a 73 y.o. female who presents via audio/video conferencing for a telehealth visit today.  Since last being seen in our clinic, the patient reports doing very well.  Today, she denies symptoms of palpitations, chest pain, shortness of breath,  lower extremity edema, dizziness, presyncope, or syncope.  The patient is otherwise without complaint today.  The patient denies symptoms of fevers, chills, cough, or new SOB worrisome for COVID 19.  She has a history significant for syncope and intermittent complete heart block and is now status post Medtronic dual-chamber pacemaker.  She had a fall and was found to have a low ejection fraction thought to be stress-induced cardiomyopathy.  Her ejection fraction has since improved to normal.  Today, denies symptoms of palpitations, chest pain, shortness of breath, orthopnea, PND, lower extremity edema, claudication, dizziness, presyncope, syncope, bleeding, or neurologic sequela. The patient is tolerating medications without difficulties.    Past Medical History:  Diagnosis Date  . Anxiety    " husband passed unexpectedly in 2013" (11/29/2017)  . Arthritis    "right knee, hands, cervical & lumbar" (11/29/2017)  . DDD (degenerative disc disease), cervical   . DDD (degenerative disc disease), lumbar   . Degenerative  scoliosis in adult patient   . GERD (gastroesophageal reflux disease)   . History of loop recorder- removed 11/29/17 11/30/2017  . Hypertension   . Nerve pain    CYST IN LOWER LUMBAR SPINE  . Pneumonia 1990s X 1  . PONV (postoperative nausea and vomiting)    WHEN SHE HAD NECK SURGERY ?2002, HAD SOME N/V, NONE WITH HER OTHER SURGERIES  . Presence of permanent cardiac pacemaker 11/29/2017  . RBBB   . S/P placement of cardiac pacemaker 11/29/17 MDT 11/30/2017    Past Surgical History:  Procedure Laterality Date  . ANTERIOR CERVICAL DECOMP/DISCECTOMY FUSION  ~ 2002   PLACEMENT OF TITANIUM PLATE  . BACK SURGERY    . CATARACT EXTRACTION W/ INTRAOCULAR LENS  IMPLANT, BILATERAL    . CESAREAN SECTION  1973; 1977  . DILATION AND CURETTAGE OF UTERUS     PART OF UTERINE POLYP REMOVAL  . INSERT / REPLACE / REMOVE PACEMAKER  11/29/2017  . LEFT HEART CATH AND CORONARY ANGIOGRAPHY N/A 03/27/2018   Procedure: LEFT HEART CATH AND CORONARY ANGIOGRAPHY;  Surgeon: Troy Sine, MD;  Location: [facility];  Service: Cardiovascular;  Laterality: N/A;  . LOOP RECORDER INSERTION N/A 07/09/2017   Procedure: LOOP RECORDER INSERTION;  Surgeon: Constance Haw, MD;  Location: [facility];  Service: Cardiovascular;  Laterality: N/A;  . LOOP RECORDER REMOVAL  11/29/2017  . LOOP RECORDER REMOVAL N/A 11/29/2017   Procedure: LOOP RECORDER REMOVAL;  Surgeon: Constance Haw, MD;  Location: [facility];  Service: Cardiovascular;  Laterality: N/A;  . PACEMAKER IMPLANT N/A  11/29/2017   Procedure: PACEMAKER IMPLANT;  Surgeon: Constance Haw, MD;  Location: [facility];  Service: Cardiovascular;  Laterality: N/A;  . REVERSE SHOULDER ARTHROPLASTY Right 06/28/2016   Procedure: REVERSE SHOULDER ARTHROPLASTY;  Surgeon: Justice Britain, MD;  Location: [facility];  Service: Orthopedics;  Laterality: Right;  . TONSILLECTOMY  1940s  . UTERINE POLYPS      Current Outpatient Medications   Medication Sig Dispense Refill  . calcium-vitamin D (OSCAL WITH D) 500-200 MG-UNIT tablet Take 1 tablet by mouth daily.    . cholecalciferol (VITAMIN D) 1000 units tablet Take 1,000 Units by mouth daily.    Marland Kitchen escitalopram (LEXAPRO) 5 MG tablet Take 5 mg by mouth daily.    Marland Kitchen estradiol (ESTRACE) 0.5 MG tablet Take 0.5 mg by mouth daily.    Marland Kitchen gabapentin (NEURONTIN) 300 MG capsule Take 300 mg by mouth at bedtime.     Marland Kitchen HYDROcodone-acetaminophen (NORCO) 10-325 MG tablet Take 0.5 tablets by mouth daily as needed for moderate pain.    Marland Kitchen lisinopril (PRINIVIL,ZESTRIL) 10 MG tablet Take 1 tablet (10 mg total) by mouth daily. 90 tablet 3  . Magnesium 250 MG TABS Take 250 mg by mouth daily.    . Multiple Vitamin (MULTIVITAMIN WITH MINERALS) TABS tablet Take 1 tablet by mouth daily. Centrum Silver    . progesterone (PROMETRIUM) 100 MG capsule Take 100 mg by mouth at bedtime.      No current facility-administered medications for this visit.     Allergies:   Chocolate and Penicillins   Social History:  The patient  reports that she has never smoked. She has never used smokeless tobacco. She reports current alcohol use of about 14.0 standard drinks of alcohol per week. She reports that she does not use drugs.   Family History:  The patient's  family history includes Dementia in her mother; Heart disease in her brother and father.   ROS:  Please see the history of present illness.   All other systems are personally reviewed and negative.    Exam:    Vital Signs:  BP 108/62   Pulse 69   Wt 105 lb (47.6 kg)   BMI 18.90 kg/m   Well appearing, alert and conversant, regular work of breathing,  good skin color Eyes- anicteric, neuro- grossly intact, skin- no apparent rash or lesions or cyanosis, mouth- oral mucosa is pink   Labs/Other Tests and Data Reviewed:    Recent Labs: 03/25/2018: ALT 31; Magnesium 2.2 03/28/2018: BUN 10; Creatinine, Ser 0.60; Hemoglobin 11.8; Platelets 188; Potassium 4.4;  Sodium 140   Wt Readings from Last 3 Encounters:  09/30/18 105 lb (47.6 kg)  04/08/18 114 lb (51.7 kg)  03/27/18 114 lb 10.2 oz (52 kg)     Other studies personally reviewed: Additional studies/ records that were reviewed today include: ECG 03/28/18  Review of the above records today demonstrates:  SR, RBBB, LAFB, TWI  TTE 07/03/18 - Left ventricle: The cavity size was normal. Systolic function was   normal. The estimated ejection fraction was in the range of 55%   to 60%. Wall motion was normal; there were no regional wall   motion abnormalities. Doppler parameters are consistent with   abnormal left ventricular relaxation (grade 1 diastolic   dysfunction). - Aortic valve: There was no regurgitation. - Mitral valve: Transvalvular velocity was within the normal range.   There was no evidence for stenosis. There was no regurgitation. - Left atrium: The atrium was mildly dilated. -  Right ventricle: The cavity size was normal. Wall thickness was   normal. Systolic function was normal. - Tricuspid valve: There was no regurgitation. - Pulmonary arteries: Systolic pressure could not be accurately   estimated.  Last device remote is reviewed from [facility] PDF dated 09/15/18 which reveals normal device function, no arrhythmias    ASSESSMENT & PLAN:    1.  Syncope with complete heart block: Status post Medtronic dual-chamber pacemaker implanted 11/29/2017.  Device functioning appropriately on the last remote check.  No changes  2.  Takotsubo's cardiomyopathy: Fortunately her ejection fraction has improved back to normal.  Continue lisinopril  3.  Right bundle branch block, left anterior fascicular block: Chronic on ECG from her hospitalization.  No changes.  COVID 19 screen The patient denies symptoms of COVID 19 at this time.  The importance of social distancing was discussed today.  Follow-up: 1 year  Current medicines are reviewed at length with the patient today.   The patient  does not have concerns regarding her medicines.  The following changes were made today:  none  Labs/ tests ordered today include:  No orders of the defined types were placed in this encounter.    Patient Risk:  after full review of this patients clinical status, I feel that they are at moderate risk at this time.  Today, I have spent 15 minutes with the patient with telehealth technology discussing complete heart block.    Signed, Lavergne Hiltunen Meredith Leeds, MD  09/30/2018 11:11 AM     [facility] [address] [place]  [place] [address] [phone] (office) [phone] (fax)

## 2018-11-17 DIAGNOSIS — M47896 Other spondylosis, lumbar region: Secondary | ICD-10-CM | POA: Diagnosis not present

## 2018-11-17 DIAGNOSIS — Z79891 Long term (current) use of opiate analgesic: Secondary | ICD-10-CM | POA: Diagnosis not present

## 2018-12-11 DIAGNOSIS — M25532 Pain in left wrist: Secondary | ICD-10-CM | POA: Diagnosis not present

## 2018-12-11 DIAGNOSIS — S52502A Unspecified fracture of the lower end of left radius, initial encounter for closed fracture: Secondary | ICD-10-CM | POA: Diagnosis not present

## 2018-12-15 ENCOUNTER — Ambulatory Visit (INDEPENDENT_AMBULATORY_CARE_PROVIDER_SITE_OTHER): Payer: Medicare HMO | Admitting: *Deleted

## 2018-12-15 DIAGNOSIS — I442 Atrioventricular block, complete: Secondary | ICD-10-CM

## 2018-12-15 DIAGNOSIS — R55 Syncope and collapse: Secondary | ICD-10-CM

## 2018-12-16 LAB — CUP PACEART REMOTE DEVICE CHECK
Battery Remaining Longevity: 169 mo
Battery Voltage: 3.08 V
Brady Statistic AP VP Percent: 0.07 %
Brady Statistic AP VS Percent: 9.08 %
Brady Statistic AS VP Percent: 0.85 %
Brady Statistic AS VS Percent: 90 %
Brady Statistic RA Percent Paced: 9.1 %
Brady Statistic RV Percent Paced: 0.92 %
Date Time Interrogation Session: 20200629051118
Implantable Lead Implant Date: 20190614
Implantable Lead Implant Date: 20190614
Implantable Lead Location: 753859
Implantable Lead Location: 753860
Implantable Lead Model: 5076
Implantable Lead Model: 5076
Implantable Pulse Generator Implant Date: 20190614
Lead Channel Impedance Value: 266 Ohm
Lead Channel Impedance Value: 342 Ohm
Lead Channel Impedance Value: 380 Ohm
Lead Channel Impedance Value: 456 Ohm
Lead Channel Pacing Threshold Amplitude: 0.5 V
Lead Channel Pacing Threshold Amplitude: 1.375 V
Lead Channel Pacing Threshold Pulse Width: 0.4 ms
Lead Channel Pacing Threshold Pulse Width: 0.4 ms
Lead Channel Sensing Intrinsic Amplitude: 12.625 mV
Lead Channel Sensing Intrinsic Amplitude: 12.625 mV
Lead Channel Sensing Intrinsic Amplitude: 3 mV
Lead Channel Sensing Intrinsic Amplitude: 3 mV
Lead Channel Setting Pacing Amplitude: 2.5 V
Lead Channel Setting Pacing Amplitude: 2.75 V
Lead Channel Setting Pacing Pulse Width: 0.4 ms
Lead Channel Setting Sensing Sensitivity: 0.9 mV

## 2018-12-22 DIAGNOSIS — D7589 Other specified diseases of blood and blood-forming organs: Secondary | ICD-10-CM | POA: Diagnosis not present

## 2018-12-22 DIAGNOSIS — R69 Illness, unspecified: Secondary | ICD-10-CM | POA: Diagnosis not present

## 2018-12-22 DIAGNOSIS — R197 Diarrhea, unspecified: Secondary | ICD-10-CM | POA: Diagnosis not present

## 2018-12-22 DIAGNOSIS — E441 Mild protein-calorie malnutrition: Secondary | ICD-10-CM | POA: Diagnosis not present

## 2018-12-22 DIAGNOSIS — Z0000 Encounter for general adult medical examination without abnormal findings: Secondary | ICD-10-CM | POA: Diagnosis not present

## 2018-12-22 DIAGNOSIS — I10 Essential (primary) hypertension: Secondary | ICD-10-CM | POA: Diagnosis not present

## 2018-12-22 DIAGNOSIS — Z79899 Other long term (current) drug therapy: Secondary | ICD-10-CM | POA: Diagnosis not present

## 2018-12-24 NOTE — Progress Notes (Signed)
Remote pacemaker transmission.   

## 2018-12-25 DIAGNOSIS — M47816 Spondylosis without myelopathy or radiculopathy, lumbar region: Secondary | ICD-10-CM | POA: Diagnosis not present

## 2018-12-26 DIAGNOSIS — S52502A Unspecified fracture of the lower end of left radius, initial encounter for closed fracture: Secondary | ICD-10-CM | POA: Diagnosis not present

## 2019-01-09 DIAGNOSIS — S52502D Unspecified fracture of the lower end of left radius, subsequent encounter for closed fracture with routine healing: Secondary | ICD-10-CM | POA: Diagnosis not present

## 2019-01-22 DIAGNOSIS — S52502D Unspecified fracture of the lower end of left radius, subsequent encounter for closed fracture with routine healing: Secondary | ICD-10-CM | POA: Diagnosis not present

## 2019-01-23 ENCOUNTER — Telehealth: Payer: Self-pay | Admitting: Cardiology

## 2019-01-23 NOTE — Telephone Encounter (Signed)
Returned call to [facility] at [employer],  Pt's sx scheduled for Monday.  Ashely is just going to call [name], RN for Dr. Curt Bears and see if she can get this taken care of today since there is noone handling preop today.  Caryl Pina thanked me for the call back.

## 2019-01-23 NOTE — Telephone Encounter (Signed)
New Message    Karen Gomez from [facility] Gomez is calling because the  Pt is scheduled on Monday for a procedure, she has a fractured wrist  They are needing some clearance for Anesthesia because she has a pace maker  Please call   Fax number [phone]

## 2019-01-26 DIAGNOSIS — S52502P Unspecified fracture of the lower end of left radius, subsequent encounter for closed fracture with malunion: Secondary | ICD-10-CM | POA: Diagnosis not present

## 2019-01-26 DIAGNOSIS — S52502D Unspecified fracture of the lower end of left radius, subsequent encounter for closed fracture with routine healing: Secondary | ICD-10-CM | POA: Diagnosis not present

## 2019-01-26 DIAGNOSIS — S52592P Other fractures of lower end of left radius, subsequent encounter for closed fracture with malunion: Secondary | ICD-10-CM | POA: Diagnosis not present

## 2019-02-10 DIAGNOSIS — S52502D Unspecified fracture of the lower end of left radius, subsequent encounter for closed fracture with routine healing: Secondary | ICD-10-CM | POA: Diagnosis not present

## 2019-02-10 DIAGNOSIS — Z4789 Encounter for other orthopedic aftercare: Secondary | ICD-10-CM | POA: Diagnosis not present

## 2019-02-24 DIAGNOSIS — S52502D Unspecified fracture of the lower end of left radius, subsequent encounter for closed fracture with routine healing: Secondary | ICD-10-CM | POA: Diagnosis not present

## 2019-02-24 DIAGNOSIS — Z4789 Encounter for other orthopedic aftercare: Secondary | ICD-10-CM | POA: Diagnosis not present

## 2019-02-24 DIAGNOSIS — M25632 Stiffness of left wrist, not elsewhere classified: Secondary | ICD-10-CM | POA: Diagnosis not present

## 2019-03-09 DIAGNOSIS — M25632 Stiffness of left wrist, not elsewhere classified: Secondary | ICD-10-CM | POA: Diagnosis not present

## 2019-03-16 DIAGNOSIS — M25632 Stiffness of left wrist, not elsewhere classified: Secondary | ICD-10-CM | POA: Diagnosis not present

## 2019-03-17 ENCOUNTER — Ambulatory Visit (INDEPENDENT_AMBULATORY_CARE_PROVIDER_SITE_OTHER): Payer: Medicare HMO | Admitting: *Deleted

## 2019-03-17 DIAGNOSIS — R55 Syncope and collapse: Secondary | ICD-10-CM

## 2019-03-17 DIAGNOSIS — I442 Atrioventricular block, complete: Secondary | ICD-10-CM

## 2019-03-17 LAB — CUP PACEART REMOTE DEVICE CHECK
Battery Remaining Longevity: 154 mo
Battery Voltage: 3.04 V
Brady Statistic AP VP Percent: 3.09 %
Brady Statistic AP VS Percent: 0.95 %
Brady Statistic AS VP Percent: 91.27 %
Brady Statistic AS VS Percent: 4.69 %
Brady Statistic RA Percent Paced: 3.99 %
Brady Statistic RV Percent Paced: 94.36 %
Date Time Interrogation Session: 20200929051102
Implantable Lead Implant Date: 20190614
Implantable Lead Implant Date: 20190614
Implantable Lead Location: 753859
Implantable Lead Location: 753860
Implantable Lead Model: 5076
Implantable Lead Model: 5076
Implantable Pulse Generator Implant Date: 20190614
Lead Channel Impedance Value: 247 Ohm
Lead Channel Impedance Value: 323 Ohm
Lead Channel Impedance Value: 380 Ohm
Lead Channel Impedance Value: 513 Ohm
Lead Channel Pacing Threshold Amplitude: 0.375 V
Lead Channel Pacing Threshold Amplitude: 1.75 V
Lead Channel Pacing Threshold Pulse Width: 0.4 ms
Lead Channel Pacing Threshold Pulse Width: 0.4 ms
Lead Channel Sensing Intrinsic Amplitude: 17.375 mV
Lead Channel Sensing Intrinsic Amplitude: 17.375 mV
Lead Channel Sensing Intrinsic Amplitude: 3 mV
Lead Channel Sensing Intrinsic Amplitude: 3 mV
Lead Channel Setting Pacing Amplitude: 2.5 V
Lead Channel Setting Pacing Amplitude: 3.5 V
Lead Channel Setting Pacing Pulse Width: 0.4 ms
Lead Channel Setting Sensing Sensitivity: 0.9 mV

## 2019-03-18 DIAGNOSIS — N952 Postmenopausal atrophic vaginitis: Secondary | ICD-10-CM | POA: Diagnosis not present

## 2019-03-18 DIAGNOSIS — N958 Other specified menopausal and perimenopausal disorders: Secondary | ICD-10-CM | POA: Diagnosis not present

## 2019-03-18 DIAGNOSIS — N951 Menopausal and female climacteric states: Secondary | ICD-10-CM | POA: Diagnosis not present

## 2019-03-18 DIAGNOSIS — Z1231 Encounter for screening mammogram for malignant neoplasm of breast: Secondary | ICD-10-CM | POA: Diagnosis not present

## 2019-03-18 DIAGNOSIS — M8588 Other specified disorders of bone density and structure, other site: Secondary | ICD-10-CM | POA: Diagnosis not present

## 2019-03-18 DIAGNOSIS — Z01419 Encounter for gynecological examination (general) (routine) without abnormal findings: Secondary | ICD-10-CM | POA: Diagnosis not present

## 2019-03-18 DIAGNOSIS — Z681 Body mass index (BMI) 19 or less, adult: Secondary | ICD-10-CM | POA: Diagnosis not present

## 2019-03-20 DIAGNOSIS — M25552 Pain in left hip: Secondary | ICD-10-CM | POA: Diagnosis not present

## 2019-03-23 ENCOUNTER — Other Ambulatory Visit: Payer: Self-pay | Admitting: Obstetrics and Gynecology

## 2019-03-23 DIAGNOSIS — R928 Other abnormal and inconclusive findings on diagnostic imaging of breast: Secondary | ICD-10-CM

## 2019-03-24 ENCOUNTER — Other Ambulatory Visit: Payer: Self-pay

## 2019-03-24 ENCOUNTER — Ambulatory Visit
Admission: RE | Admit: 2019-03-24 | Discharge: 2019-03-24 | Disposition: A | Discharge status: Home / Self Care | Payer: Medicare HMO | Source: Ambulatory Visit | Attending: Obstetrics and Gynecology | Admitting: Obstetrics and Gynecology

## 2019-03-24 ENCOUNTER — Other Ambulatory Visit: Payer: Self-pay | Admitting: Obstetrics and Gynecology

## 2019-03-24 DIAGNOSIS — N6489 Other specified disorders of breast: Secondary | ICD-10-CM

## 2019-03-24 DIAGNOSIS — R928 Other abnormal and inconclusive findings on diagnostic imaging of breast: Secondary | ICD-10-CM

## 2019-03-24 DIAGNOSIS — R922 Inconclusive mammogram: Secondary | ICD-10-CM | POA: Diagnosis not present

## 2019-03-24 DIAGNOSIS — S52502D Unspecified fracture of the lower end of left radius, subsequent encounter for closed fracture with routine healing: Secondary | ICD-10-CM | POA: Diagnosis not present

## 2019-03-25 ENCOUNTER — Other Ambulatory Visit (HOSPITAL_COMMUNITY): Payer: Self-pay | Admitting: Orthopedic Surgery

## 2019-03-25 DIAGNOSIS — M25552 Pain in left hip: Secondary | ICD-10-CM

## 2019-03-26 DIAGNOSIS — M25632 Stiffness of left wrist, not elsewhere classified: Secondary | ICD-10-CM | POA: Diagnosis not present

## 2019-03-27 ENCOUNTER — Ambulatory Visit
Admission: RE | Admit: 2019-03-27 | Discharge: 2019-03-27 | Disposition: A | Discharge status: Home / Self Care | Payer: Medicare HMO | Source: Ambulatory Visit | Attending: Obstetrics and Gynecology | Admitting: Obstetrics and Gynecology

## 2019-03-27 ENCOUNTER — Other Ambulatory Visit: Payer: Self-pay

## 2019-03-27 DIAGNOSIS — N6011 Diffuse cystic mastopathy of right breast: Secondary | ICD-10-CM | POA: Diagnosis not present

## 2019-03-27 DIAGNOSIS — N6489 Other specified disorders of breast: Secondary | ICD-10-CM | POA: Diagnosis not present

## 2019-03-27 NOTE — Progress Notes (Signed)
Remote pacemaker transmission.   

## 2019-04-06 DIAGNOSIS — M25632 Stiffness of left wrist, not elsewhere classified: Secondary | ICD-10-CM | POA: Diagnosis not present

## 2019-04-08 DIAGNOSIS — M25551 Pain in right hip: Secondary | ICD-10-CM | POA: Diagnosis not present

## 2019-04-08 DIAGNOSIS — Z23 Encounter for immunization: Secondary | ICD-10-CM | POA: Diagnosis not present

## 2019-04-23 DIAGNOSIS — S52502D Unspecified fracture of the lower end of left radius, subsequent encounter for closed fracture with routine healing: Secondary | ICD-10-CM | POA: Diagnosis not present

## 2019-04-23 DIAGNOSIS — Z4789 Encounter for other orthopedic aftercare: Secondary | ICD-10-CM | POA: Diagnosis not present

## 2019-05-01 ENCOUNTER — Telehealth: Payer: Self-pay | Admitting: *Deleted

## 2019-05-01 DIAGNOSIS — R928 Other abnormal and inconclusive findings on diagnostic imaging of breast: Secondary | ICD-10-CM | POA: Diagnosis not present

## 2019-05-01 NOTE — Telephone Encounter (Signed)
   Primary Cardiologist: Will Meredith Leeds, MD  Chart reviewed as part of pre-operative protocol coverage. Patient was contacted 05/01/2019 in reference to pre-operative risk assessment for pending surgery as outlined below.  Karen Gomez was last seen on 09/2018 via telemedicine with Dr. Curt Bears. She has history of Takotsubo cardiomyopathy, RBBB, DDD, GERD, HTN, bradycardia/syncope with PPM. Cath in 03/2018 showed normal coronaries with EF 35-40% suggestive of stress induced cardiomyopathy. F/u echo 06/2018 showed normalized LV function. RCRI is 0.9% (accounting for prior LV dysfunction) indicating low risk of CV complications. I spoke with the patient who affirms she is doing great from a cardiac standpoint without any new cardiac symptoms. Therefore, based on ACC/AHA guidelines, the patient would be at acceptable risk for the planned procedure without further cardiovascular testing. She estimates the procedure will take place sometime between Thanksgiving and Christmas.  I will route this recommendation to the requesting party via Epic fax function and remove from pre-op pool. I will also route to device clinic in case there is anything they need to be aware of or submit given her history of pacemaker.  Please call with questions.  Charlie Pitter, PA-C 05/01/2019, 2:32 PM

## 2019-05-01 NOTE — Telephone Encounter (Signed)
   [facility] Pre-operative Risk Assessment    Request for surgical clearance:  1. What type of surgery is being performed? RIGHT BREAST SEE GUIDED EXCISIONAL BX   2. When is this surgery scheduled? TBD   3. What type of clearance is required (medical clearance vs. Pharmacy clearance to hold med vs. Both)? MEDICAL  4. Are there any medications that need to be held prior to surgery and how long? NONE LISTED    5. Practice name and name of physician performing surgery? [facility]; DR. Rodman Key WAKE[place]    6. What is your office phone number [phone]    7.   What is your office fax number [phone] ATTN: Illene Regulus, CMA  8.   Anesthesia type (None, local, MAC, general) ? GENERAL   Julaine Hua 05/01/2019, 1:25 PM  _________________________________________________________________   (provider comments below)

## 2019-05-06 ENCOUNTER — Other Ambulatory Visit: Payer: Self-pay | Admitting: General Surgery

## 2019-05-06 DIAGNOSIS — R928 Other abnormal and inconclusive findings on diagnostic imaging of breast: Secondary | ICD-10-CM

## 2019-05-07 ENCOUNTER — Other Ambulatory Visit: Payer: Self-pay | Admitting: General Surgery

## 2019-05-07 DIAGNOSIS — R928 Other abnormal and inconclusive findings on diagnostic imaging of breast: Secondary | ICD-10-CM

## 2019-05-22 ENCOUNTER — Other Ambulatory Visit: Payer: Self-pay

## 2019-05-22 ENCOUNTER — Encounter (HOSPITAL_BASED_OUTPATIENT_CLINIC_OR_DEPARTMENT_OTHER): Payer: Self-pay | Admitting: *Deleted

## 2019-05-22 NOTE — Progress Notes (Signed)
PMH and cardiac clearance reviewed with Dr. Smith Robert. Will get EKG at PAT appt. No further testing needed prior to [place]. Pacemaker form faxed to [facility].

## 2019-05-25 ENCOUNTER — Encounter (HOSPITAL_BASED_OUTPATIENT_CLINIC_OR_DEPARTMENT_OTHER)
Admission: RE | Admit: 2019-05-25 | Discharge: 2019-05-25 | Disposition: A | Discharge status: Home / Self Care | Payer: Medicare HMO | Source: Ambulatory Visit | Attending: General Surgery | Admitting: General Surgery

## 2019-05-25 ENCOUNTER — Other Ambulatory Visit: Payer: Self-pay

## 2019-05-25 ENCOUNTER — Other Ambulatory Visit (HOSPITAL_COMMUNITY)
Admission: RE | Admit: 2019-05-25 | Discharge: 2019-05-25 | Disposition: A | Discharge status: Home / Self Care | Payer: Medicare HMO | Source: Ambulatory Visit | Attending: General Surgery | Admitting: General Surgery

## 2019-05-25 DIAGNOSIS — Z20828 Contact with and (suspected) exposure to other viral communicable diseases: Secondary | ICD-10-CM | POA: Insufficient documentation

## 2019-05-25 DIAGNOSIS — Z01812 Encounter for preprocedural laboratory examination: Secondary | ICD-10-CM | POA: Insufficient documentation

## 2019-05-25 LAB — SARS CORONAVIRUS 2 (TAT 6-24 HRS): SARS Coronavirus 2: NEGATIVE

## 2019-05-25 MED ORDER — ENSURE PRE-SURGERY PO LIQD: 296.0000 mL | Freq: Once | ORAL | Status: DC

## 2019-05-25 NOTE — Progress Notes (Signed)
EKG reviewed by Dr. Miller, will proceed with surgery as scheduled.  

## 2019-05-25 NOTE — Progress Notes (Signed)

## 2019-05-27 ENCOUNTER — Ambulatory Visit
Admission: RE | Admit: 2019-05-27 | Discharge: 2019-05-27 | Disposition: A | Discharge status: Home / Self Care | Payer: Medicare HMO | Source: Ambulatory Visit | Attending: General Surgery | Admitting: General Surgery

## 2019-05-27 ENCOUNTER — Other Ambulatory Visit: Payer: Self-pay

## 2019-05-27 DIAGNOSIS — R928 Other abnormal and inconclusive findings on diagnostic imaging of breast: Secondary | ICD-10-CM

## 2019-05-28 ENCOUNTER — Ambulatory Visit (HOSPITAL_BASED_OUTPATIENT_CLINIC_OR_DEPARTMENT_OTHER): Payer: Medicare HMO | Admitting: Anesthesiology

## 2019-05-28 ENCOUNTER — Other Ambulatory Visit: Payer: Self-pay

## 2019-05-28 ENCOUNTER — Ambulatory Visit
Admission: RE | Admit: 2019-05-28 | Discharge: 2019-05-28 | Disposition: A | Discharge status: Home / Self Care | Payer: Medicare HMO | Source: Ambulatory Visit | Attending: General Surgery | Admitting: General Surgery

## 2019-05-28 ENCOUNTER — Ambulatory Visit (HOSPITAL_BASED_OUTPATIENT_CLINIC_OR_DEPARTMENT_OTHER)
Admission: RE | Admit: 2019-05-28 | Discharge: 2019-05-28 | Disposition: A | Discharge status: Home / Self Care | Payer: Medicare HMO | Attending: General Surgery | Admitting: General Surgery

## 2019-05-28 ENCOUNTER — Encounter (HOSPITAL_BASED_OUTPATIENT_CLINIC_OR_DEPARTMENT_OTHER): Payer: Self-pay | Admitting: General Surgery

## 2019-05-28 ENCOUNTER — Encounter (HOSPITAL_BASED_OUTPATIENT_CLINIC_OR_DEPARTMENT_OTHER)
Admission: RE | Disposition: A | Discharge status: Home / Self Care | Payer: Self-pay | Source: Home / Self Care | Attending: General Surgery

## 2019-05-28 DIAGNOSIS — I10 Essential (primary) hypertension: Secondary | ICD-10-CM | POA: Insufficient documentation

## 2019-05-28 DIAGNOSIS — M1990 Unspecified osteoarthritis, unspecified site: Secondary | ICD-10-CM | POA: Insufficient documentation

## 2019-05-28 DIAGNOSIS — Z79899 Other long term (current) drug therapy: Secondary | ICD-10-CM | POA: Insufficient documentation

## 2019-05-28 DIAGNOSIS — Z950 Presence of cardiac pacemaker: Secondary | ICD-10-CM | POA: Insufficient documentation

## 2019-05-28 DIAGNOSIS — Z880 Allergy status to penicillin: Secondary | ICD-10-CM | POA: Insufficient documentation

## 2019-05-28 DIAGNOSIS — N6091 Unspecified benign mammary dysplasia of right breast: Secondary | ICD-10-CM | POA: Insufficient documentation

## 2019-05-28 DIAGNOSIS — N6489 Other specified disorders of breast: Secondary | ICD-10-CM | POA: Diagnosis not present

## 2019-05-28 DIAGNOSIS — E785 Hyperlipidemia, unspecified: Secondary | ICD-10-CM | POA: Diagnosis not present

## 2019-05-28 DIAGNOSIS — I214 Non-ST elevation (NSTEMI) myocardial infarction: Secondary | ICD-10-CM | POA: Diagnosis not present

## 2019-05-28 DIAGNOSIS — N6011 Diffuse cystic mastopathy of right breast: Secondary | ICD-10-CM | POA: Diagnosis not present

## 2019-05-28 DIAGNOSIS — R928 Other abnormal and inconclusive findings on diagnostic imaging of breast: Secondary | ICD-10-CM | POA: Diagnosis not present

## 2019-05-28 DIAGNOSIS — I4510 Unspecified right bundle-branch block: Secondary | ICD-10-CM | POA: Insufficient documentation

## 2019-05-28 DIAGNOSIS — D4861 Neoplasm of uncertain behavior of right breast: Secondary | ICD-10-CM | POA: Diagnosis not present

## 2019-05-28 HISTORY — PX: RADIOACTIVE SEED GUIDED EXCISIONAL BREAST BIOPSY: SHX6490

## 2019-05-28 SURGERY — RADIOACTIVE SEED GUIDED BREAST BIOPSY
Anesthesia: General | Site: Breast | Laterality: Right

## 2019-05-28 MED ORDER — DEXAMETHASONE SODIUM PHOSPHATE 4 MG/ML IJ SOLN
INTRAMUSCULAR | Status: DC | PRN
  Administered 2019-05-28: 5 mg via INTRAVENOUS

## 2019-05-28 MED ORDER — OXYCODONE HCL 5 MG PO TABS
ORAL_TABLET | ORAL | Status: AC
  Filled 2019-05-28: qty 1

## 2019-05-28 MED ORDER — ACETAMINOPHEN 10 MG/ML IV SOLN: 1000.0000 mg | Freq: Once | INTRAVENOUS | Status: DC | PRN

## 2019-05-28 MED ORDER — CIPROFLOXACIN IN D5W 400 MG/200ML IV SOLN
INTRAVENOUS | Status: AC
  Filled 2019-05-28: qty 200

## 2019-05-28 MED ORDER — GABAPENTIN 100 MG PO CAPS
ORAL_CAPSULE | ORAL | Status: AC
  Filled 2019-05-28: qty 1

## 2019-05-28 MED ORDER — ONDANSETRON HCL 4 MG/2ML IJ SOLN
INTRAMUSCULAR | Status: AC
  Filled 2019-05-28: qty 2

## 2019-05-28 MED ORDER — LIDOCAINE 2% (20 MG/ML) 5 ML SYRINGE
INTRAMUSCULAR | Status: AC
  Filled 2019-05-28: qty 5

## 2019-05-28 MED ORDER — PROPOFOL 10 MG/ML IV BOLUS
INTRAVENOUS | Status: DC | PRN
  Administered 2019-05-28: 120 mg via INTRAVENOUS

## 2019-05-28 MED ORDER — LIDOCAINE 2% (20 MG/ML) 5 ML SYRINGE
INTRAMUSCULAR | Status: DC | PRN
  Administered 2019-05-28: 80 mg via INTRAVENOUS

## 2019-05-28 MED ORDER — ACETAMINOPHEN 160 MG/5ML PO SOLN: 325.0000 mg | ORAL | Status: DC | PRN

## 2019-05-28 MED ORDER — FENTANYL CITRATE (PF) 100 MCG/2ML IJ SOLN
INTRAMUSCULAR | Status: AC
  Filled 2019-05-28: qty 2

## 2019-05-28 MED ORDER — FENTANYL CITRATE (PF) 100 MCG/2ML IJ SOLN
INTRAMUSCULAR | Status: DC | PRN
  Administered 2019-05-28 (×2): 50 ug via INTRAVENOUS

## 2019-05-28 MED ORDER — FENTANYL CITRATE (PF) 100 MCG/2ML IJ SOLN
25.0000 ug | INTRAMUSCULAR | Status: DC | PRN
  Administered 2019-05-28: 50 ug via INTRAVENOUS

## 2019-05-28 MED ORDER — BUPIVACAINE HCL (PF) 0.25 % IJ SOLN
INTRAMUSCULAR | Status: DC | PRN
  Administered 2019-05-28: 10 mL

## 2019-05-28 MED ORDER — DEXAMETHASONE SODIUM PHOSPHATE 10 MG/ML IJ SOLN
INTRAMUSCULAR | Status: AC
  Filled 2019-05-28: qty 1

## 2019-05-28 MED ORDER — ACETAMINOPHEN 325 MG PO TABS: 325.0000 mg | ORAL_TABLET | ORAL | Status: DC | PRN

## 2019-05-28 MED ORDER — ACETAMINOPHEN 500 MG PO TABS
1000.0000 mg | ORAL_TABLET | ORAL | Status: AC
  Administered 2019-05-28: 1000 mg via ORAL

## 2019-05-28 MED ORDER — GABAPENTIN 100 MG PO CAPS
100.0000 mg | ORAL_CAPSULE | ORAL | Status: AC
  Administered 2019-05-28: 100 mg via ORAL

## 2019-05-28 MED ORDER — OXYCODONE HCL 5 MG PO TABS
5.0000 mg | ORAL_TABLET | Freq: Once | ORAL | Status: AC | PRN
  Administered 2019-05-28: 5 mg via ORAL

## 2019-05-28 MED ORDER — LACTATED RINGERS IV SOLN
INTRAVENOUS | Status: DC
  Administered 2019-05-28: 14:00:00 via INTRAVENOUS

## 2019-05-28 MED ORDER — MEPERIDINE HCL 25 MG/ML IJ SOLN: 6.2500 mg | INTRAMUSCULAR | Status: DC | PRN

## 2019-05-28 MED ORDER — TRAMADOL HCL 50 MG PO TABS: 50.0000 mg | ORAL_TABLET | Freq: Four times a day (QID) | ORAL | 0 refills | Status: DC | PRN

## 2019-05-28 MED ORDER — ACETAMINOPHEN 500 MG PO TABS
ORAL_TABLET | ORAL | Status: AC
  Filled 2019-05-28: qty 2

## 2019-05-28 MED ORDER — ONDANSETRON HCL 4 MG/2ML IJ SOLN
INTRAMUSCULAR | Status: DC | PRN
  Administered 2019-05-28: 4 mg via INTRAVENOUS

## 2019-05-28 MED ORDER — OXYCODONE HCL 5 MG/5ML PO SOLN: 5.0000 mg | Freq: Once | ORAL | Status: AC | PRN

## 2019-05-28 MED ORDER — CIPROFLOXACIN IN D5W 400 MG/200ML IV SOLN
400.0000 mg | INTRAVENOUS | Status: AC
  Administered 2019-05-28: 400 mg via INTRAVENOUS

## 2019-05-28 MED ORDER — PROMETHAZINE HCL 25 MG/ML IJ SOLN: 6.2500 mg | INTRAMUSCULAR | Status: DC | PRN

## 2019-05-28 MED ORDER — PHENYLEPHRINE HCL (PRESSORS) 10 MG/ML IV SOLN
INTRAVENOUS | Status: DC | PRN
  Administered 2019-05-28 (×3): 80 ug via INTRAVENOUS
  Administered 2019-05-28: 40 ug via INTRAVENOUS

## 2019-05-28 SURGICAL SUPPLY — 61 items
ADH SKN CLS APL DERMABOND .7 (GAUZE/BANDAGES/DRESSINGS) ×1
APL PRP STRL LF DISP 70% ISPRP (MISCELLANEOUS) ×1
APPLIER CLIP 9.375 MED OPEN (MISCELLANEOUS)
APR CLP MED 9.3 20 MLT OPN (MISCELLANEOUS)
BINDER BREAST LRG (GAUZE/BANDAGES/DRESSINGS) IMPLANT
BINDER BREAST MEDIUM (GAUZE/BANDAGES/DRESSINGS) ×2 IMPLANT
BINDER BREAST XLRG (GAUZE/BANDAGES/DRESSINGS) IMPLANT
BINDER BREAST XXLRG (GAUZE/BANDAGES/DRESSINGS) IMPLANT
BLADE SURG 15 STRL LF DISP TIS (BLADE) ×1 IMPLANT
BLADE SURG 15 STRL SS (BLADE) ×3
CANISTER SUC SOCK COL 7IN (MISCELLANEOUS) IMPLANT
CANISTER SUCT 1200ML W/VALVE (MISCELLANEOUS) IMPLANT
CHLORAPREP W/TINT 26 (MISCELLANEOUS) ×3 IMPLANT
CLIP APPLIE 9.375 MED OPEN (MISCELLANEOUS) IMPLANT
CLIP VESOCCLUDE SM WIDE 6/CT (CLIP) IMPLANT
CLOSURE WOUND 1/2 X4 (GAUZE/BANDAGES/DRESSINGS) ×1
COVER BACK TABLE REUSABLE LG (DRAPES) ×3 IMPLANT
COVER MAYO STAND REUSABLE (DRAPES) ×3 IMPLANT
COVER PROBE W GEL 5X96 (DRAPES) ×3 IMPLANT
COVER WAND RF STERILE (DRAPES) IMPLANT
DECANTER SPIKE VIAL GLASS SM (MISCELLANEOUS) IMPLANT
DERMABOND ADVANCED (GAUZE/BANDAGES/DRESSINGS) ×2
DERMABOND ADVANCED .7 DNX12 (GAUZE/BANDAGES/DRESSINGS) ×1 IMPLANT
DRAPE LAPAROSCOPIC ABDOMINAL (DRAPES) ×3 IMPLANT
DRAPE UTILITY XL STRL (DRAPES) ×3 IMPLANT
DRSG TEGADERM 4X4.75 (GAUZE/BANDAGES/DRESSINGS) IMPLANT
ELECT COATED BLADE 2.86 ST (ELECTRODE) ×3 IMPLANT
ELECT REM PT RETURN 9FT ADLT (ELECTROSURGICAL) ×3
ELECTRODE REM PT RTRN 9FT ADLT (ELECTROSURGICAL) ×1 IMPLANT
GAUZE SPONGE 4X4 12PLY STRL LF (GAUZE/BANDAGES/DRESSINGS) IMPLANT
GLOVE BIO SURGEON STRL SZ7 (GLOVE) ×9 IMPLANT
GLOVE BIOGEL PI IND STRL 7.0 (GLOVE) IMPLANT
GLOVE BIOGEL PI IND STRL 7.5 (GLOVE) ×1 IMPLANT
GLOVE BIOGEL PI INDICATOR 7.0 (GLOVE) ×2
GLOVE BIOGEL PI INDICATOR 7.5 (GLOVE) ×4
GOWN STRL REUS W/ TWL LRG LVL3 (GOWN DISPOSABLE) ×2 IMPLANT
GOWN STRL REUS W/TWL LRG LVL3 (GOWN DISPOSABLE) ×6
HEMOSTAT ARISTA ABSORB 3G PWDR (HEMOSTASIS) IMPLANT
ILLUMINATOR WAVEGUIDE N/F (MISCELLANEOUS) IMPLANT
KIT MARKER MARGIN INK (KITS) ×3 IMPLANT
LIGHT WAVEGUIDE WIDE FLAT (MISCELLANEOUS) IMPLANT
NEEDLE HYPO 25X1 1.5 SAFETY (NEEDLE) ×3 IMPLANT
NS IRRIG 1000ML POUR BTL (IV SOLUTION) IMPLANT
PACK BASIN DAY SURGERY FS (CUSTOM PROCEDURE TRAY) ×3 IMPLANT
PENCIL SMOKE EVACUATOR (MISCELLANEOUS) ×3 IMPLANT
SLEEVE SCD COMPRESS KNEE MED (MISCELLANEOUS) ×3 IMPLANT
SPONGE LAP 4X18 RFD (DISPOSABLE) ×3 IMPLANT
STRIP CLOSURE SKIN 1/2X4 (GAUZE/BANDAGES/DRESSINGS) ×2 IMPLANT
SUT MNCRL AB 4-0 PS2 18 (SUTURE) IMPLANT
SUT MON AB 5-0 PS2 18 (SUTURE) ×3 IMPLANT
SUT SILK 2 0 SH (SUTURE) IMPLANT
SUT VIC AB 2-0 SH 27 (SUTURE) ×3
SUT VIC AB 2-0 SH 27XBRD (SUTURE) ×1 IMPLANT
SUT VIC AB 3-0 SH 27 (SUTURE) ×3
SUT VIC AB 3-0 SH 27X BRD (SUTURE) ×1 IMPLANT
SYR CONTROL 10ML LL (SYRINGE) ×3 IMPLANT
TOWEL GREEN STERILE FF (TOWEL DISPOSABLE) ×3 IMPLANT
TRAY FAXITRON CT DISP (TRAY / TRAY PROCEDURE) ×3 IMPLANT
TUBE CONNECTING 20'X1/4 (TUBING)
TUBE CONNECTING 20X1/4 (TUBING) IMPLANT
YANKAUER SUCT BULB TIP NO VENT (SUCTIONS) IMPLANT

## 2019-05-28 NOTE — Op Note (Signed)
Preoperative diagnosis: Right breast mammographic distortion with discordant core biopsy Postoperative diagnosis: Same as above Procedure: Right breast radioactive seed guided excisional biopsy Surgeon: Dr. Serita Grammes Anesthesia: General Estimated blood loss: Minimal Complications: None Drains: None Specimens: Right breast tissue marked with paint Sponge needle count was correct at completion Disposition to recovery stable condition  Indications:73 yof with screening mammographic right breast distortion. there on screen and dx views is a upper central right breast distortion present. [place] is negative. she had biopsy and is fcc with calcs. this is discordant per radiology. We discussed options and elected to proceed with excisional biopsy.   Procedure: After informed consent was obtained the patient was given antibiotics.  SCDs were placed.  She was placed under general anesthesia without complication.  Her right breast was prepped and draped in the standard sterile surgical fashion.  A surgical timeout was then performed.  I identified the radioactive seed.  I infiltrated Marcaine along the medial border of the areola.  I then made a periareolar incision in order to hide the scar later.  I used the neoprobe to guide the removal of the seed and the surrounding tissue.  Mammogram was taken confirming removal of the seed and the clip.  Hemostasis was observed.  I closed this with 2-0 Vicryl, 3-0 Vicryl and, 5-0 Monocryl.  Glue was placed.  She tolerated this well was extubated transferred recovery stable.

## 2019-05-28 NOTE — Anesthesia Procedure Notes (Signed)
Procedure Name: LMA Insertion Date/Time: 05/28/2019 3:07 PM Performed by: Maryella Shivers, CRNA Pre-anesthesia Checklist: Patient identified, Emergency Drugs available, Suction available and Patient being monitored Patient Re-evaluated:Patient Re-evaluated prior to induction Oxygen Delivery Method: Circle system utilized Preoxygenation: Pre-oxygenation with 100% oxygen Induction Type: IV induction Ventilation: Mask ventilation without difficulty LMA: LMA inserted LMA Size: 4.0 Number of attempts: 1 Airway Equipment and Method: Bite block Placement Confirmation: positive ETCO2 Tube secured with: Tape Dental Injury: Teeth and Oropharynx as per pre-operative assessment

## 2019-05-28 NOTE — Anesthesia Preprocedure Evaluation (Addendum)
Anesthesia Evaluation  Patient identified by MRN, date of birth, ID band Patient awake    History of Anesthesia Complications (+) PONV and history of anesthetic complications  Airway Mallampati: I       Dental no notable dental hx. (+) Teeth Intact   Pulmonary    Pulmonary exam normal breath sounds clear to auscultation       Cardiovascular hypertension, Pt. on medications Normal cardiovascular exam Rhythm:Regular Rate:Normal     Neuro/Psych    GI/Hepatic   Endo/Other    Renal/GU      Musculoskeletal   Abdominal Normal abdominal exam  (+)   Peds  Hematology   Anesthesia Other Findings ------------------------------------------------------------------- LV EF: 55% -   60%  ------------------------------------------------------------------- Indications:      Takotsubo cardiomyopathy.  ------------------------------------------------------------------- History:   PMH:  Right bundle branch block. Pacemaker.  Syncope. Risk factors:  Hypertension.  ------------------------------------------------------------------- Study Conclusions  - Left ventricle: The cavity size was normal. Systolic function was   normal. The estimated ejection fraction was in the range of 55%   to 60%. Wall motion was normal; there were no regional wall   motion abnormalities. Doppler parameters are consistent with   abnormal left ventricular relaxation (grade 1 diastolic   dysfunction). - Aortic valve: There was no regurgitation. - Mitral valve: Transvalvular velocity was within the normal range.   There was no evidence for stenosis. There was no regurgitation. - Left atrium: The atrium was mildly dilated. - Right ventricle: The cavity size was normal. Wall thickness was   normal. Systolic function was normal. - Tricuspid valve: There was no regurgitation. - Pulmonary arteries: Systolic pressure could not be accurately    estimated. R55 (ICD-10-CM)) CHB (complete heart block) (HCC) (I44.2 (ICD-10-CM)) Conclusion  Scheduled remote reviewed. Normal device function.      Reproductive/Obstetrics                           Anesthesia Physical Anesthesia Plan  ASA: II  Anesthesia Plan: General   Post-op Pain Management:    Induction:   PONV Risk Score and Plan: 4 or greater and Ondansetron and Dexamethasone  Airway Management Planned: LMA  Additional Equipment: None  Intra-op Plan:   Post-operative Plan: Extubation in OR  Informed Consent: I have reviewed the patients History and Physical, chart, labs and discussed the procedure including the risks, benefits and alternatives for the proposed anesthesia with the patient or authorized representative who has indicated his/her understanding and acceptance.     Dental advisory given  Plan Discussed with: CRNA  Anesthesia Plan Comments:         Anesthesia Quick Evaluation

## 2019-05-28 NOTE — Interval H&P Note (Signed)
History and Physical Interval Note:  05/28/2019 1:40 PM  Karen Gomez  has presented today for surgery, with the diagnosis of RIGHT BREAST DISTORTION.  The various methods of treatment have been discussed with the patient and family. After consideration of risks, benefits and other options for treatment, the patient has consented to  Procedure(s): RADIOACTIVE SEED GUIDED EXCISIONAL RIGHT  BREAST BIOPSY (Right) as a surgical intervention.  The patient's history has been reviewed, patient examined, no change in status, stable for surgery.  I have reviewed the patient's chart and labs.  Questions were answered to the patient's satisfaction.     Rolm Bookbinder

## 2019-05-28 NOTE — Discharge Instructions (Signed)
[place] Office Phone Number [phone]  BREAST BIOPSY/ PARTIAL MASTECTOMY: POST OP INSTRUCTIONS Take 400 mg of ibuprofen every 8 hours or 650 mg tylenol every 6 hours for next 72 hours then as needed. Use ice several times daily also. Always review your discharge instruction sheet given to you by the facility where your surgery was performed.  IF YOU HAVE DISABILITY OR FAMILY LEAVE FORMS, YOU MUST BRING THEM TO THE OFFICE FOR PROCESSING.  DO NOT GIVE THEM TO YOUR DOCTOR.  1. A prescription for pain medication may be given to you upon discharge.  Take your pain medication as prescribed, if needed.  If narcotic pain medicine is not needed, then you may take acetaminophen (Tylenol), naprosyn (Alleve) or ibuprofen (Advil) as needed. No Tylenol until 7:46pm 2. Take your usually prescribed medications unless otherwise directed 3. If you need a refill on your pain medication, please contact your pharmacy.  They will contact our office to request authorization.  Prescriptions will not be filled after 5pm or on week-ends. 4. You should eat very light the first 24 hours after surgery, such as soup, crackers, pudding, etc.  Resume your normal diet the day after surgery. 5. Most patients will experience some swelling and bruising in the breast.  Ice packs and a good support bra will help.  Wear the breast binder provided or a sports bra for 72 hours day and night.  After that wear a sports bra during the day until you return to the office. Swelling and bruising can take several days to resolve.  6. It is common to experience some constipation if taking pain medication after surgery.  Increasing fluid intake and taking a stool softener will usually help or prevent this problem from occurring.  A mild laxative (Milk of Magnesia or Miralax) should be taken according to package directions if there are no bowel movements after 48 hours. 7. Unless discharge instructions indicate otherwise, you may  remove your bandages 48 hours after surgery and you may shower at that time.  You may have steri-strips (small skin tapes) in place directly over the incision.  These strips should be left on the skin for 7-10 days and will come off on their own.  If your surgeon used skin glue on the incision, you may shower in 24 hours.  The glue will flake off over the next 2-3 weeks.  Any sutures or staples will be removed at the office during your follow-up visit. 8. ACTIVITIES:  You may resume regular daily activities (gradually increasing) beginning the next day.  Wearing a good support bra or sports bra minimizes pain and swelling.  You may have sexual intercourse when it is comfortable. a. You may drive when you no longer are taking prescription pain medication, you can comfortably wear a seatbelt, and you can safely maneuver your car and apply brakes. b. RETURN TO WORK:  ______________________________________________________________________________________ 9. You should see your doctor in the office for a follow-up appointment approximately two weeks after your surgery.  Your doctor's nurse will typically make your follow-up appointment when she calls you with your pathology report.  Expect your pathology report 3-4 business days after your surgery.  You may call to check if you do not hear from [place] after three days. 10. OTHER INSTRUCTIONS: _______________________________________________________________________________________________ _____________________________________________________________________________________________________________________________________ _____________________________________________________________________________________________________________________________________ _____________________________________________________________________________________________________________________________________  WHEN TO CALL DR WAKEFIELD: 1. Fever over 101.0 2. Nausea and/or  vomiting. 3. Extreme swelling or bruising. 4. Continued bleeding from incision. 5. Increased pain, redness, or drainage from the  incision.  The clinic staff is available to answer your questions during regular business hours.  Please don't hesitate to call and ask to speak to one of the nurses for clinical concerns.  If you have a medical emergency, go to the nearest emergency room or call 911.  A surgeon from [facility] is always on call at the hospital.  For further questions, please visit [email or web address]    Post Anesthesia Home Care Instructions  Activity: Get plenty of rest for the remainder of the day. A responsible individual must stay with you for 24 hours following the procedure.  For the next 24 hours, DO NOT: -Drive a car -Paediatric nurse -Drink alcoholic beverages -Take any medication unless instructed by your physician -Make any legal decisions or sign important papers.  Meals: Start with liquid foods such as gelatin or soup. Progress to regular foods as tolerated. Avoid greasy, spicy, heavy foods. If nausea and/or vomiting occur, drink only clear liquids until the nausea and/or vomiting subsides. Call your physician if vomiting continues.  Special Instructions/Symptoms: Your throat may feel dry or sore from the anesthesia or the breathing tube placed in your throat during surgery. If this causes discomfort, gargle with warm salt water. The discomfort should disappear within 24 hours.  If you had a scopolamine patch placed behind your ear for the management of post- operative nausea and/or vomiting:  1. The medication in the patch is effective for 72 hours, after which it should be removed.  Wrap patch in a tissue and discard in the trash. Wash hands thoroughly with soap and water. 2. You may remove the patch earlier than 72 hours if you experience unpleasant side effects which may include dry mouth, dizziness or visual disturbances. 3. Avoid  touching the patch. Wash your hands with soap and water after contact with the patch.

## 2019-05-28 NOTE — Transfer of Care (Signed)
Immediate Anesthesia Transfer of Care Note  Patient: Karen Gomez  Procedure(s) Performed: RADIOACTIVE SEED GUIDED EXCISIONAL RIGHT  BREAST BIOPSY (Right Breast)  Patient Location: PACU  Anesthesia Type:General  Level of Consciousness: awake, alert , oriented and patient cooperative  Airway & Oxygen Therapy: Patient Spontanous Breathing and Patient connected to nasal cannula oxygen  Post-op Assessment: Report given to RN and Post -op Vital signs reviewed and stable  Post vital signs: Reviewed and stable  Last Vitals:  Vitals Value Taken Time  BP 139/62 05/28/19 1549  Temp    Pulse 84 05/28/19 1552  Resp 19 05/28/19 1552  SpO2 100 % 05/28/19 1552  Vitals shown include unvalidated device data.  Last Pain:  Vitals:   05/28/19 1338  TempSrc: Oral  PainSc: 4       Patients Stated Pain Goal: 3 ([id number])  Complications: No apparent anesthesia complications

## 2019-05-28 NOTE — H&P (Signed)
73 yof referred by Dr Dema Severin for right breast distortion. she has no prior history of breast disease and no fh of breast/ovarian cancer. she has no mass or dc. she got screening mm with c density breasts. there on screen and dx views is a upper central right breast distortion present. [place] is negative. she had biopsy and is fcc with calcs. this is discordant per radiology. she is here to discuss options. her daughter in law is on the phone today.   Past Surgical History (Karen Gomez, [place]; 05/01/2019 8:48 AM) Breast Biopsy  Right. Cataract Surgery  Bilateral. Cesarean Section - Multiple  Shoulder Surgery  Right. Spinal Surgery - Neck  Tonsillectomy   Diagnostic Studies History (Karen Gomez, [place]; 05/01/2019 8:48 AM) Colonoscopy  1-5 years ago Mammogram  within last year Pap Smear  1-5 years ago  Allergies (Karen Gomez, [place]; 05/01/2019 8:49 AM) Penicillins  Allergies Reconciled   Medication History (Karen Gomez, RMA; 05/01/2019 8:50 AM) Lisinopril (5MG  Tablet, Oral) Active. Gabapentin (300MG  Capsule, Oral) Active. Escitalopram Oxalate (10MG  Tablet, Oral) Active. Medications Reconciled  Social History (Karen Gomez, [place]; 05/01/2019 8:48 AM) Alcohol use  Moderate alcohol use. Caffeine use  Coffee. No drug use  Tobacco use  Never smoker.  Family History (Karen Gomez, [place]; 05/01/2019 8:48 AM) Arthritis  Brother, Mother. Cerebrovascular Accident  Brother. Heart Disease  Brother, Father. Heart disease in female family member before age 21   Pregnancy / Birth History (Karen Gomez, RMA; 05/01/2019 8:48 AM) Age at menarche  40 years. Age of menopause  51-55 Contraceptive History  Intrauterine device, Oral contraceptives. Gravida  2 Maternal age  78-30 Para  2  Other Problems (Karen Gomez, [place]; 05/01/2019 8:48 AM) Arthritis  Back Pain  High blood pressure    Review of Systems (Karen A. Brown RMA;  05/01/2019 8:48 AM) General Not Present- Appetite Loss, Chills, Fatigue, Fever, Night Sweats, Weight Gain and Weight Loss. Skin Not Present- Change in Wart/Mole, Dryness, Hives, Jaundice, New Lesions, Non-Healing Wounds, Rash and Ulcer. HEENT Not Present- Earache, Hearing Loss, Hoarseness, Nose Bleed, Oral Ulcers, Ringing in the Ears, Seasonal Allergies, Sinus Pain, Sore Throat, Visual Disturbances, Wears glasses/contact lenses and Yellow Eyes. Respiratory Not Present- Bloody sputum, Chronic Cough, Difficulty Breathing, Snoring and Wheezing. Breast Not Present- Breast Mass, Breast Pain, Nipple Discharge and Skin Changes. Cardiovascular Not Present- Chest Pain, Difficulty Breathing Lying Down, Leg Cramps, Palpitations, Rapid Heart Rate, Shortness of Breath and Swelling of Extremities. Gastrointestinal Not Present- Abdominal Pain, Bloating, Bloody Stool, Change in Bowel Habits, Chronic diarrhea, Constipation, Difficulty Swallowing, Excessive gas, Gets full quickly at meals, Hemorrhoids, Indigestion, Nausea, Rectal Pain and Vomiting. Female Genitourinary Not Present- Frequency, Nocturia, Painful Urination, Pelvic Pain and Urgency. Musculoskeletal Present- Back Pain. Not Present- Joint Pain, Joint Stiffness, Muscle Pain, Muscle Weakness and Swelling of Extremities. Neurological Not Present- Decreased Memory, Fainting, Headaches, Numbness, Seizures, Tingling, Tremor, Trouble walking and Weakness. Psychiatric Not Present- Anxiety, Bipolar, Change in Sleep Pattern, Depression, Fearful and Frequent crying. Endocrine Present- Hair Changes. Not Present- Cold Intolerance, Excessive Hunger, Heat Intolerance, Hot flashes and New Diabetes. Hematology Present- Easy Bruising. Not Present- Blood Thinners, Excessive bleeding, Gland problems, HIV and Persistent Infections.  Vitals (Karen A. Brown RMA; 05/01/2019 8:49 AM) 05/01/2019 8:49 AM Weight: 111 lb Height: 62.5in Body Surface Area: 1.5 m Body Mass  Index: 19.98 kg/m  Temp.: 97.14F  Pulse: 92 (Regular)  BP: 118/82 (Sitting, Left Arm, Standard) Physical Exam Rolm Bookbinder MD; 05/01/2019 9:30  AM) General Mental Status-Alert. Orientation-Oriented X3. Head and Neck Trachea-midline. Thyroid Gland Characteristics - normal size and consistency. Eye Sclera/Conjunctiva - Bilateral-No scleral icterus. Chest and Lung Exam Chest and lung exam reveals -quiet, even and easy respiratory effort with no use of accessory muscles. Note: pacemaker left chest Breast Nipples-No Discharge. Breast Lump-No Palpable Breast Mass. Neurologic Neurologic evaluation reveals -alert and oriented x 3 with no impairment of recent or remote memory. Lymphatic Head & Neck General Head & Neck Lymphatics: Bilateral - Description - Normal. Axillary General Axillary Region: Bilateral - Description - Normal. Note: no [place] adenopathy    Assessment & Plan Rolm Bookbinder MD; 05/01/2019 9:32 AM) ABNORMAL MAMMOGRAM OF RIGHT BREAST (R92.8) Story: Right breast seed guided excisional biopsy we discussed options. discussed possible upgrade rate and difficulty following a distortion. she would like to proceed with surgery. will discuss with cardiology prior to surgery. discussed surgery, seed placement, risks and recovery with her

## 2019-05-28 NOTE — Anesthesia Postprocedure Evaluation (Signed)
Anesthesia Post Note  Patient: Karen Gomez  Procedure(s) Performed: RADIOACTIVE SEED GUIDED EXCISIONAL RIGHT  BREAST BIOPSY (Right Breast)     Patient location during evaluation: Phase II Anesthesia Type: General Level of consciousness: awake and sedated Pain management: pain level controlled Vital Signs Assessment: post-procedure vital signs reviewed and stable Respiratory status: spontaneous breathing Cardiovascular status: stable Postop Assessment: no apparent nausea or vomiting Anesthetic complications: no    Last Vitals:  Vitals:   05/28/19 1615 05/28/19 1630  BP: 139/90 (!) 143/64  Pulse: 85 (!) 59  Resp: 19 14  Temp:    SpO2: 99% 98%    Last Pain:  Vitals:   05/28/19 1630  TempSrc:   PainSc: 5    Pain Goal: Patients Stated Pain Goal: 3 (05/28/19 1338)                 Huston Foley

## 2019-05-29 ENCOUNTER — Encounter: Payer: Self-pay | Admitting: *Deleted

## 2019-06-01 LAB — SURGICAL PATHOLOGY

## 2019-06-16 ENCOUNTER — Ambulatory Visit (INDEPENDENT_AMBULATORY_CARE_PROVIDER_SITE_OTHER): Payer: Medicare HMO | Admitting: *Deleted

## 2019-06-16 DIAGNOSIS — R55 Syncope and collapse: Secondary | ICD-10-CM | POA: Diagnosis not present

## 2019-06-16 LAB — CUP PACEART REMOTE DEVICE CHECK
Battery Remaining Longevity: 144 mo
Battery Voltage: 3.02 V
Brady Statistic AP VP Percent: 0.4 %
Brady Statistic AP VS Percent: 0 %
Brady Statistic AS VP Percent: 99.59 %
Brady Statistic AS VS Percent: 0 %
Brady Statistic RA Percent Paced: 0.4 %
Brady Statistic RV Percent Paced: 100 %
Date Time Interrogation Session: 20201229001117
Implantable Lead Implant Date: 20190614
Implantable Lead Implant Date: 20190614
Implantable Lead Location: 753859
Implantable Lead Location: 753860
Implantable Lead Model: 5076
Implantable Lead Model: 5076
Implantable Pulse Generator Implant Date: 20190614
Lead Channel Impedance Value: 247 Ohm
Lead Channel Impedance Value: 323 Ohm
Lead Channel Impedance Value: 380 Ohm
Lead Channel Impedance Value: 513 Ohm
Lead Channel Pacing Threshold Amplitude: 0.375 V
Lead Channel Pacing Threshold Amplitude: 1.75 V
Lead Channel Pacing Threshold Pulse Width: 0.4 ms
Lead Channel Pacing Threshold Pulse Width: 0.4 ms
Lead Channel Sensing Intrinsic Amplitude: 13 mV
Lead Channel Sensing Intrinsic Amplitude: 13.5 mV
Lead Channel Sensing Intrinsic Amplitude: 3.875 mV
Lead Channel Sensing Intrinsic Amplitude: 3.875 mV
Lead Channel Setting Pacing Amplitude: 2.5 V
Lead Channel Setting Pacing Amplitude: 3.5 V
Lead Channel Setting Pacing Pulse Width: 0.4 ms
Lead Channel Setting Sensing Sensitivity: 2.8 mV

## 2019-06-24 DIAGNOSIS — D7589 Other specified diseases of blood and blood-forming organs: Secondary | ICD-10-CM | POA: Diagnosis not present

## 2019-06-24 DIAGNOSIS — M8588 Other specified disorders of bone density and structure, other site: Secondary | ICD-10-CM | POA: Diagnosis not present

## 2019-06-24 DIAGNOSIS — R69 Illness, unspecified: Secondary | ICD-10-CM | POA: Diagnosis not present

## 2019-06-24 DIAGNOSIS — Z7189 Other specified counseling: Secondary | ICD-10-CM | POA: Diagnosis not present

## 2019-06-24 DIAGNOSIS — E440 Moderate protein-calorie malnutrition: Secondary | ICD-10-CM | POA: Diagnosis not present

## 2019-06-24 DIAGNOSIS — I10 Essential (primary) hypertension: Secondary | ICD-10-CM | POA: Diagnosis not present

## 2019-09-11 DIAGNOSIS — M5416 Radiculopathy, lumbar region: Secondary | ICD-10-CM | POA: Diagnosis not present

## 2019-09-15 ENCOUNTER — Ambulatory Visit (INDEPENDENT_AMBULATORY_CARE_PROVIDER_SITE_OTHER): Payer: Medicare HMO | Admitting: *Deleted

## 2019-09-15 DIAGNOSIS — I442 Atrioventricular block, complete: Secondary | ICD-10-CM | POA: Diagnosis not present

## 2019-09-15 LAB — CUP PACEART REMOTE DEVICE CHECK
Battery Remaining Longevity: 139 mo
Battery Voltage: 3.02 V
Brady Statistic AP VP Percent: 1.13 %
Brady Statistic AP VS Percent: 0.06 %
Brady Statistic AS VP Percent: 98.21 %
Brady Statistic AS VS Percent: 0.6 %
Brady Statistic RA Percent Paced: 1.18 %
Brady Statistic RV Percent Paced: 99.34 %
Date Time Interrogation Session: 20210330011152
Implantable Lead Implant Date: 20190614
Implantable Lead Implant Date: 20190614
Implantable Lead Location: 753859
Implantable Lead Location: 753860
Implantable Lead Model: 5076
Implantable Lead Model: 5076
Implantable Pulse Generator Implant Date: 20190614
Lead Channel Impedance Value: 266 Ohm
Lead Channel Impedance Value: 342 Ohm
Lead Channel Impedance Value: 418 Ohm
Lead Channel Impedance Value: 494 Ohm
Lead Channel Pacing Threshold Amplitude: 0.5 V
Lead Channel Pacing Threshold Amplitude: 1.625 V
Lead Channel Pacing Threshold Pulse Width: 0.4 ms
Lead Channel Pacing Threshold Pulse Width: 0.4 ms
Lead Channel Sensing Intrinsic Amplitude: 14.75 mV
Lead Channel Sensing Intrinsic Amplitude: 14.75 mV
Lead Channel Sensing Intrinsic Amplitude: 2.375 mV
Lead Channel Sensing Intrinsic Amplitude: 2.375 mV
Lead Channel Setting Pacing Amplitude: 2.5 V
Lead Channel Setting Pacing Amplitude: 3.25 V
Lead Channel Setting Pacing Pulse Width: 0.4 ms
Lead Channel Setting Sensing Sensitivity: 2.8 mV

## 2019-09-15 NOTE — Progress Notes (Signed)
PPM Remote  

## 2019-09-16 DIAGNOSIS — I10 Essential (primary) hypertension: Secondary | ICD-10-CM | POA: Diagnosis not present

## 2019-09-16 DIAGNOSIS — R69 Illness, unspecified: Secondary | ICD-10-CM | POA: Diagnosis not present

## 2019-10-01 ENCOUNTER — Ambulatory Visit: Payer: Medicare HMO | Admitting: Cardiology

## 2019-10-01 ENCOUNTER — Other Ambulatory Visit: Payer: Self-pay

## 2019-10-01 ENCOUNTER — Encounter: Payer: Self-pay | Admitting: Cardiology

## 2019-10-01 VITALS — BP 120/63 | HR 60 | Ht 62.5 in | Wt 111.6 lb

## 2019-10-01 DIAGNOSIS — I442 Atrioventricular block, complete: Secondary | ICD-10-CM | POA: Diagnosis not present

## 2019-10-01 LAB — CUP PACEART INCLINIC DEVICE CHECK
Date Time Interrogation Session: 20210415161523
Implantable Lead Implant Date: 20190614
Implantable Lead Implant Date: 20190614
Implantable Lead Location: 753859
Implantable Lead Location: 753860
Implantable Lead Model: 5076
Implantable Lead Model: 5076
Implantable Pulse Generator Implant Date: 20190614

## 2019-10-01 NOTE — Progress Notes (Signed)
Electrophysiology Office Note   Date:  10/01/2019   ID:  Karen Gomez, Karen Gomez 05-Dec-1945, MRN [id number]  PCP:  Harlan Stains, MD Primary Electrophysiologist:  Adekunle Rohrbach Meredith Leeds, MD    No chief complaint on file.    History of Present Illness: Karen Gomez is a 74 y.o. female who presents today for electrophysiology evaluation.   She has a history of hypertension.   She has had multiple episodes of syncope.  She was waiting in the line to see Gaylyn Rong at this time.  She had been waiting in line for approximately 20 minutes prior to the episode.  Per family, she slumped down and sustained no trauma.  She was unconscious for 5-10 seconds.  No seizure-like activity, loss of bowel bladder function or tongue biting.  She was not disoriented when she awoke.  A few days later, she had multiple episodes of syncope.  The first occurred when she was standing for multiple hours while making with her grandchildren.  She had 2 other syncopal episodes while standing.  She had one while sitting.  Prior to whole of her episodes, she had weakness, and dizziness.  She felt well after all of her episodes.  A Medtronic dual-chamber pacemaker implanted 11/29/2017.  She was recently discharged from the hospital on 03/27/2018 after a fall.  It was thought that the fall was due to a mechanical issue, but on echo her ejection fraction was 40 to 45%.  Left heart catheterization showed normal coronary arteries and a possible takotsubo's cardiomyopathy pattern on her LV gram.  Today, denies symptoms of palpitations, chest pain, shortness of breath, orthopnea, PND, lower extremity edema, claudication, dizziness, presyncope, syncope, bleeding, or neurologic sequela. The patient is tolerating medications without difficulties.  She currently feels well.  She has no chest pain or shortness of breath.  She is able to do all of her daily activities without restriction.  She has had no further episodes of syncope.  She has  done well with no major complaints.  Past Medical History:  Diagnosis Date  . Anxiety    " husband passed unexpectedly in 2013" (11/29/2017)  . Arthritis    "right knee, hands, cervical & lumbar" (11/29/2017)  . DDD (degenerative disc disease), cervical   . DDD (degenerative disc disease), lumbar   . Degenerative scoliosis in adult patient   . GERD (gastroesophageal reflux disease)   . History of loop recorder- removed 11/29/17 11/30/2017  . Hypertension   . Nerve pain    CYST IN LOWER LUMBAR SPINE  . Pneumonia 1990s X 1  . PONV (postoperative nausea and vomiting)    WHEN SHE HAD NECK SURGERY ?2002, HAD SOME N/V, NONE WITH HER OTHER SURGERIES  . Presence of permanent cardiac pacemaker 11/29/2017  . RBBB   . S/P placement of cardiac pacemaker 11/29/17 MDT 11/30/2017   Past Surgical History:  Procedure Laterality Date  . ANTERIOR CERVICAL DECOMP/DISCECTOMY FUSION  ~ 2002   PLACEMENT OF TITANIUM PLATE  . BACK SURGERY    . CATARACT EXTRACTION W/ INTRAOCULAR LENS  IMPLANT, BILATERAL    . CESAREAN SECTION  1973; 1977  . DILATION AND CURETTAGE OF UTERUS     PART OF UTERINE POLYP REMOVAL  . INSERT / REPLACE / REMOVE PACEMAKER  11/29/2017  . LEFT HEART CATH AND CORONARY ANGIOGRAPHY N/A 03/27/2018   Procedure: LEFT HEART CATH AND CORONARY ANGIOGRAPHY;  Surgeon: Troy Sine, MD;  Location: [facility];  Service: Cardiovascular;  Laterality: N/A;  .  LOOP RECORDER INSERTION N/A 07/09/2017   Procedure: LOOP RECORDER INSERTION;  Surgeon: Constance Haw, MD;  Location: [facility];  Service: Cardiovascular;  Laterality: N/A;  . LOOP RECORDER REMOVAL  11/29/2017  . LOOP RECORDER REMOVAL N/A 11/29/2017   Procedure: LOOP RECORDER REMOVAL;  Surgeon: Constance Haw, MD;  Location: [facility];  Service: Cardiovascular;  Laterality: N/A;  . PACEMAKER IMPLANT N/A 11/29/2017   Procedure: PACEMAKER IMPLANT;  Surgeon: Constance Haw, MD;  Location: [facility];   Service: Cardiovascular;  Laterality: N/A;  . RADIOACTIVE SEED GUIDED EXCISIONAL BREAST BIOPSY Right 05/28/2019   Procedure: RADIOACTIVE SEED GUIDED EXCISIONAL RIGHT  BREAST BIOPSY;  Surgeon: Rolm Bookbinder, MD;  Location: [facility];  Service: General;  Laterality: Right;  . REVERSE SHOULDER ARTHROPLASTY Right 06/28/2016   Procedure: REVERSE SHOULDER ARTHROPLASTY;  Surgeon: Justice Britain, MD;  Location: [facility];  Service: Orthopedics;  Laterality: Right;  . TONSILLECTOMY  1940s  . UTERINE POLYPS       Current Outpatient Medications  Medication Sig Dispense Refill  . Ascorbic Acid (VITAMIN C) 1000 MG tablet Take 1,000 mg by mouth daily.    . calcium-vitamin D (OSCAL WITH D) 500-200 MG-UNIT tablet Take 1 tablet by mouth daily.    . cholecalciferol (VITAMIN D) 1000 units tablet Take 1,000 Units by mouth daily.    Marland Kitchen escitalopram (LEXAPRO) 5 MG tablet Take 5 mg by mouth daily.    Marland Kitchen gabapentin (NEURONTIN) 300 MG capsule Take 300 mg by mouth at bedtime.     Marland Kitchen HYDROcodone-acetaminophen (NORCO) 10-325 MG tablet Take 0.5 tablets by mouth daily as needed for moderate pain.    Marland Kitchen lisinopril (PRINIVIL,ZESTRIL) 10 MG tablet Take 1 tablet (10 mg total) by mouth daily. (Patient taking differently: Take 5 mg by mouth daily. ) 90 tablet 3  . Magnesium 250 MG TABS Take 250 mg by mouth daily.    . Multiple Vitamin (MULTIVITAMIN WITH MINERALS) TABS tablet Take 1 tablet by mouth daily. Centrum Silver     No current facility-administered medications for this visit.    Allergies:   Chocolate and Penicillins   Social History:  The patient  reports that she has never smoked. She has never used smokeless tobacco. She reports current alcohol use of about 14.0 standard drinks of alcohol per week. She reports that she does not use drugs.   Family History:  The patient's family history includes Dementia in her mother; Heart disease in her brother and father.   ROS:  Please see the history of present  illness.   Otherwise, review of systems is positive for none.   All other systems are reviewed and negative.   PHYSICAL EXAM: VS:  BP 120/63   Pulse 60   Ht 5' 2.5" (1.588 m)   Wt 111 lb 9.6 oz (50.6 kg)   SpO2 100%   BMI 20.09 kg/m  , BMI Body mass index is 20.09 kg/m. GEN: Well nourished, well developed, in no acute distress  HEENT: normal  Neck: no JVD, carotid bruits, or masses Cardiac: RRR; no murmurs, rubs, or gallops,no edema  Respiratory:  clear to auscultation bilaterally, normal work of breathing GI: soft, nontender, nondistended, + BS MS: no deformity or atrophy  Skin: warm and dry, device site well healed Neuro:  Strength and sensation are intact Psych: euthymic mood, full affect  EKG:  EKG is ordered today. Personal review of the ekg ordered shows sinus rhythm, ventricular paced  Personal  review of the device interrogation today. Results in [place]: No results found for requested labs within last 8760 hours.    Lipid Panel     Component Value Date/Time   CHOL 179 03/26/2018 0550   TRIG 49 03/26/2018 0550   HDL 66 03/26/2018 0550   CHOLHDL 2.7 03/26/2018 0550   VLDL 10 03/26/2018 0550   LDLCALC 103 (H) 03/26/2018 0550     Wt Readings from Last 3 Encounters:  10/01/19 111 lb 9.6 oz (50.6 kg)  05/28/19 109 lb 9.1 oz (49.7 kg)  09/30/18 105 lb (47.6 kg)      Other studies Reviewed: Additional studies/ records that were reviewed today include: LHC 03/27/18 Review of the above records today demonstrates:   LV end diastolic pressure is normal.  There is moderate left ventricular systolic dysfunction.   Normal epicardial coronary arteries with a dominant RCA circulation.  Moderately reduced LV function with apical ballooning/akinesis of the mid distal anterolateral wall apex and mid distal inferior wall suggestive of Takotsubo cardiomyopathy.  Global ejection fraction is 35 to 40%.  LVEDP 11 mmHg.  TTE 03/26/18 - Left ventricle: Apical  ballooning. Systolic function was mildly   to moderately reduced. The estimated ejection fraction was in the   range of 40% to 45%. There is akinesis of the apical anterior,   lateral, inferolateral, inferior, and apical myocardium   consistent with Takotsubo Cardiomyopathy. There was an increased   relative contribution of atrial contraction to ventricular   filling. Doppler parameters are consistent with abnormal left   ventricular relaxation (grade 1 diastolic dysfunction). - Mitral valve: There was trivial regurgitation. - Right ventricle: Pacer wire or catheter noted in right ventricle. - Right atrium: Pacer wire or catheter noted in right atrium. - Tricuspid valve: There was trivial regurgitation. - Pulmonic valve: There was mild regurgitation.   ASSESSMENT AND PLAN:  1.  Syncope with complete heart block: Status post Medtronic dual-chamber pacemaker implanted 11/29/2017.  Device functioning appropriately.  No changes.      2.  Right bundle branch block, left anterior fascicular block: Chronic without changes.  No changes.  3.  Takotsubo's cardiomyopathy: Ejection fraction has normalized.  No changes.   Current medicines are reviewed at length with the patient today.   The patient does not have concerns regarding her medicines.  The following changes were made today: None  Labs/ tests ordered today include:  Orders Placed This Encounter  Procedures  . EKG 12-Lead     Disposition:   FU with Oriah Leinweber 12 months  Signed, Draedyn Weidinger Meredith Leeds, MD  10/01/2019 3:17 PM     [address] [place] [address] [phone] (office) [phone] (fa

## 2019-10-01 NOTE — Patient Instructions (Signed)
Medication Instructions:  Your physician recommends that you continue on your current medications as directed. Please refer to the Current Medication list given to you today.  *If you need a refill on your cardiac medications before your next appointment, please call your pharmacy*   Lab Work: None ordered If you have labs (blood work) drawn today and your tests are completely normal, you will receive your results only by: Marland Kitchen MyChart Message (if you have MyChart) OR . A paper copy in the mail If you have any lab test that is abnormal or we need to change your treatment, we will call you to review the results.   Testing/Procedures: None ordered   Follow-Up: Remote monitoring is used to monitor your Pacemaker of ICD from home. This monitoring reduces the number of office visits required to check your device to one time per year. It allows [place] to keep an eye on the functioning of your device to ensure it is working properly. You are scheduled for a device check from home on 12/15/2019. You may send your transmission at any time that day. If you have a wireless device, the transmission will be sent automatically. After your physician reviews your transmission, you will receive a postcard with your next transmission date.  At [facility], you and your health needs are our priority.  As part of our continuing mission to provide you with exceptional heart care, we have created designated Provider Care Teams.  These Care Teams include your primary Cardiologist (physician) and Advanced Practice Providers (APPs -  Physician Assistants and Nurse Practitioners) who all work together to provide you with the care you need, when you need it.  We recommend signing up for the patient portal called "MyChart".  Sign up information is provided on this After Visit Summary.  MyChart is used to connect with patients for Virtual Visits (Telemedicine).  Patients are able to view lab/test results, encounter notes,  upcoming appointments, etc.  Non-urgent messages can be sent to your provider as well.   To learn more about what you can do with MyChart, go to [email or web address].    Your next appointment:   1 year(s)  The format for your next appointment:   In Person  Provider:   Allegra Lai, MD   Thank you for choosing [facility]!!   [name], RN [phone]    Other Instructions

## 2019-10-06 DIAGNOSIS — M5136 Other intervertebral disc degeneration, lumbar region: Secondary | ICD-10-CM | POA: Diagnosis not present

## 2019-10-13 DIAGNOSIS — M65341 Trigger finger, right ring finger: Secondary | ICD-10-CM | POA: Diagnosis not present

## 2019-10-13 DIAGNOSIS — S52502D Unspecified fracture of the lower end of left radius, subsequent encounter for closed fracture with routine healing: Secondary | ICD-10-CM | POA: Diagnosis not present

## 2019-11-10 DIAGNOSIS — M65341 Trigger finger, right ring finger: Secondary | ICD-10-CM | POA: Diagnosis not present

## 2019-12-14 NOTE — Telephone Encounter (Signed)
Disregard opened in error °

## 2019-12-15 ENCOUNTER — Ambulatory Visit (INDEPENDENT_AMBULATORY_CARE_PROVIDER_SITE_OTHER): Payer: Medicare HMO | Admitting: *Deleted

## 2019-12-15 DIAGNOSIS — R55 Syncope and collapse: Secondary | ICD-10-CM | POA: Diagnosis not present

## 2019-12-15 LAB — CUP PACEART REMOTE DEVICE CHECK
Battery Remaining Longevity: 135 mo
Battery Voltage: 3.03 V
Brady Statistic AP VP Percent: 0.86 %
Brady Statistic AP VS Percent: 1.1 %
Brady Statistic AS VP Percent: 46.5 %
Brady Statistic AS VS Percent: 51.54 %
Brady Statistic RA Percent Paced: 1.9 %
Brady Statistic RV Percent Paced: 47.36 %
Date Time Interrogation Session: 20210629011302
Implantable Lead Implant Date: 20190614
Implantable Lead Implant Date: 20190614
Implantable Lead Location: 753859
Implantable Lead Location: 753860
Implantable Lead Model: 5076
Implantable Lead Model: 5076
Implantable Pulse Generator Implant Date: 20190614
Lead Channel Impedance Value: 266 Ohm
Lead Channel Impedance Value: 342 Ohm
Lead Channel Impedance Value: 399 Ohm
Lead Channel Impedance Value: 494 Ohm
Lead Channel Pacing Threshold Amplitude: 0.5 V
Lead Channel Pacing Threshold Amplitude: 1.5 V
Lead Channel Pacing Threshold Pulse Width: 0.4 ms
Lead Channel Pacing Threshold Pulse Width: 0.4 ms
Lead Channel Sensing Intrinsic Amplitude: 13.625 mV
Lead Channel Sensing Intrinsic Amplitude: 13.625 mV
Lead Channel Sensing Intrinsic Amplitude: 3.625 mV
Lead Channel Sensing Intrinsic Amplitude: 3.625 mV
Lead Channel Setting Pacing Amplitude: 2.5 V
Lead Channel Setting Pacing Amplitude: 3.25 V
Lead Channel Setting Pacing Pulse Width: 0.4 ms
Lead Channel Setting Sensing Sensitivity: 2.8 mV

## 2019-12-17 NOTE — Progress Notes (Signed)
Remote pacemaker transmission.   

## 2019-12-24 DIAGNOSIS — Z0000 Encounter for general adult medical examination without abnormal findings: Secondary | ICD-10-CM | POA: Diagnosis not present

## 2019-12-24 DIAGNOSIS — G894 Chronic pain syndrome: Secondary | ICD-10-CM | POA: Diagnosis not present

## 2019-12-24 DIAGNOSIS — I10 Essential (primary) hypertension: Secondary | ICD-10-CM | POA: Diagnosis not present

## 2019-12-24 DIAGNOSIS — D7589 Other specified diseases of blood and blood-forming organs: Secondary | ICD-10-CM | POA: Diagnosis not present

## 2019-12-24 DIAGNOSIS — K580 Irritable bowel syndrome with diarrhea: Secondary | ICD-10-CM | POA: Diagnosis not present

## 2019-12-24 DIAGNOSIS — M5136 Other intervertebral disc degeneration, lumbar region: Secondary | ICD-10-CM | POA: Diagnosis not present

## 2019-12-24 DIAGNOSIS — R69 Illness, unspecified: Secondary | ICD-10-CM | POA: Diagnosis not present

## 2019-12-24 DIAGNOSIS — M5030 Other cervical disc degeneration, unspecified cervical region: Secondary | ICD-10-CM | POA: Diagnosis not present

## 2019-12-29 ENCOUNTER — Telehealth: Payer: Self-pay | Admitting: Hematology

## 2019-12-29 NOTE — Telephone Encounter (Signed)
Received a new hem referral from Dr. Dema Severin for macrocytosis. Pt has been cld and scheduled to see Dr. Burr Medico on 7/30 at 11am. Pt aware to arrive 15 minutes early.

## 2020-01-11 NOTE — Progress Notes (Signed)
[place]   [phone] Fax:([phone]   Clinic New Consult Note   Patient Care Team: Harlan Stains, MD as PCP - General (Family Medicine) Constance Haw, MD as PCP - Cardiology (Cardiology) Constance Haw, MD as PCP - Electrophysiology (Cardiology)  Date of Service:  01/15/2020   CHIEF COMPLAINTS/PURPOSE OF CONSULTATION:  Macrocytosis   REFERRING PHYSICIAN:  PCP, Dr Dema Severin   HISTORY OF PRESENTING ILLNESS:  Karen Gomez 74 y.o. female is a here because of Macrocytosis. The patient was referred by Dr Dema Severin her PCP. The patient presents to the clinic today alone.  She notes her MCV has been elevated for some time now. She has never been anemic. She notes she feels at baseline.  Socially she does not smoke but does drink 2 glasses of wine a day. She notes her PCP feels this is related to her Mastocytosis. She is a retired Pharmacist, hospital. She notes her husband passed 8 years ago and so drinking wine is a calming pass time for her since. She notes she has 2 adult children.   Today she notes chronic lumbar and cervical back pain from stenosis and DDD which is managed by orthopedic surgeon. She will have spinal injections as needed to control pain. She also notes intermittent IBS with abdominal pain, cramps and diarrhea. Her last colonoscopy was about 2 years ago and based on age will not have to repeat. She denies nausea, voting or abdominal  bloating.  She has PMHx of repeated syncope. She had loop recorder placed and after complete heart block, she had cardiac cath and heart maker in place. She has HTN that is well controlled, Mild GERD. She was found to have [place] in 05/2019. She is Gabapentin for restless legs. She is on Lexapro for her depression. She notes her mood is stable currently.     REVIEW OF SYSTEMS:   Constitutional: Denies fevers, chills or abnormal night sweats Eyes: Denies blurriness of vision, double vision or watery eyes Ears, nose,  mouth, throat, and face: Denies mucositis or sore throat Respiratory: Denies cough, dyspnea or wheezes Cardiovascular: Denies palpitation, chest discomfort or lower extremity swelling Gastrointestinal:  Denies nausea, heartburn or change in bowel habits Skin: Denies abnormal skin rashes MSK: (+) Chronic back pain  Lymphatics: Denies new lymphadenopathy or easy bruising Neurological:Denies numbness, tingling or new weaknesses Behavioral/Psych: Mood is stable, no new changes  All other systems were reviewed with the patient and are negative.   MEDICAL HISTORY:  Past Medical History:  Diagnosis Date  . Anxiety    " husband passed unexpectedly in 2013" (11/29/2017)  . Arthritis    "right knee, hands, cervical & lumbar" (11/29/2017)  . DDD (degenerative disc disease), cervical   . DDD (degenerative disc disease), lumbar   . Degenerative scoliosis in adult patient   . GERD (gastroesophageal reflux disease)   . History of loop recorder- removed 11/29/17 11/30/2017  . Hypertension   . Nerve pain    CYST IN LOWER LUMBAR SPINE  . Pneumonia 1990s X 1  . PONV (postoperative nausea and vomiting)    WHEN SHE HAD NECK SURGERY ?2002, HAD SOME N/V, NONE WITH HER OTHER SURGERIES  . Presence of permanent cardiac pacemaker 11/29/2017  . RBBB   . S/P placement of cardiac pacemaker 11/29/17 MDT 11/30/2017    SURGICAL HISTORY: Past Surgical History:  Procedure Laterality Date  . ANTERIOR CERVICAL DECOMP/DISCECTOMY FUSION  ~ 2002   PLACEMENT OF TITANIUM PLATE  . BACK SURGERY    .  CATARACT EXTRACTION W/ INTRAOCULAR LENS  IMPLANT, BILATERAL    . CESAREAN SECTION  1973; 1977  . DILATION AND CURETTAGE OF UTERUS     PART OF UTERINE POLYP REMOVAL  . INSERT / REPLACE / REMOVE PACEMAKER  11/29/2017  . LEFT HEART CATH AND CORONARY ANGIOGRAPHY N/A 03/27/2018   Procedure: LEFT HEART CATH AND CORONARY ANGIOGRAPHY;  Surgeon: Troy Sine, MD;  Location: [facility];  Service: Cardiovascular;   Laterality: N/A;  . LOOP RECORDER INSERTION N/A 07/09/2017   Procedure: LOOP RECORDER INSERTION;  Surgeon: Constance Haw, MD;  Location: [facility];  Service: Cardiovascular;  Laterality: N/A;  . LOOP RECORDER REMOVAL  11/29/2017  . LOOP RECORDER REMOVAL N/A 11/29/2017   Procedure: LOOP RECORDER REMOVAL;  Surgeon: Constance Haw, MD;  Location: [facility];  Service: Cardiovascular;  Laterality: N/A;  . PACEMAKER IMPLANT N/A 11/29/2017   Procedure: PACEMAKER IMPLANT;  Surgeon: Constance Haw, MD;  Location: [facility];  Service: Cardiovascular;  Laterality: N/A;  . RADIOACTIVE SEED GUIDED EXCISIONAL BREAST BIOPSY Right 05/28/2019   Procedure: RADIOACTIVE SEED GUIDED EXCISIONAL RIGHT  BREAST BIOPSY;  Surgeon: Rolm Bookbinder, MD;  Location: [facility];  Service: General;  Laterality: Right;  . REVERSE SHOULDER ARTHROPLASTY Right 06/28/2016   Procedure: REVERSE SHOULDER ARTHROPLASTY;  Surgeon: Justice Britain, MD;  Location: [facility];  Service: Orthopedics;  Laterality: Right;  . TONSILLECTOMY  1940s  . UTERINE POLYPS      SOCIAL HISTORY: Social History   Socioeconomic History  . Marital status: Widowed    Spouse name: Not on file  . Number of children: 2  . Years of education: Not on file  . Highest education level: Not on file  Occupational History  . Not on file  Tobacco Use  . Smoking status: Never Smoker  . Smokeless tobacco: Never Used  Vaping Use  . Vaping Use: Never used  Substance and Sexual Activity  . Alcohol use: Yes    Alcohol/week: 14.0 standard drinks    Types: 14 Glasses of wine per week  . Drug use: Never  . Sexual activity: Not Currently  Other Topics Concern  . Not on file  Social History Narrative  . Not on file   Social Determinants of Health   Financial Resource Strain:   . Difficulty of Paying Living Expenses:   Food Insecurity:   . Worried About Charity fundraiser in the Last Year:   . Academic librarian in the Last Year:   Transportation Needs:   . Film/video editor (Medical):   Marland Kitchen Lack of Transportation (Non-Medical):   Physical Activity:   . Days of Exercise per Week:   . Minutes of Exercise per Session:   Stress:   . Feeling of Stress :   Social Connections:   . Frequency of Communication with Friends and Family:   . Frequency of Social Gatherings with Friends and Family:   . Attends Religious Services:   . Active Member of Clubs or Organizations:   . Attends Archivist Meetings:   Marland Kitchen Marital Status:   Intimate Partner Violence:   . Fear of Current or Ex-Partner:   . Emotionally Abused:   Marland Kitchen Physically Abused:   . Sexually Abused:     FAMILY HISTORY: Family History  Problem Relation Age of Onset  . Dementia Mother   . Heart disease Father   . Heart disease Brother  ALLERGIES:  is allergic to chocolate and penicillins.  MEDICATIONS:  Current Outpatient Medications  Medication Sig Dispense Refill  . Ascorbic Acid (VITAMIN C) 1000 MG tablet Take 1,000 mg by mouth daily.    . calcium-vitamin D (OSCAL WITH D) 500-200 MG-UNIT tablet Take 1 tablet by mouth daily.    . cholecalciferol (VITAMIN D) 1000 units tablet Take 1,000 Units by mouth daily.    Marland Kitchen escitalopram (LEXAPRO) 10 MG tablet Take 10 mg by mouth daily.    Marland Kitchen gabapentin (NEURONTIN) 300 MG capsule Take 300 mg by mouth at bedtime.     Marland Kitchen HYDROcodone-acetaminophen (NORCO) 10-325 MG tablet Take 0.5 tablets by mouth daily as needed for moderate pain.    . Magnesium 250 MG TABS Take 250 mg by mouth daily.    . Multiple Vitamin (MULTIVITAMIN WITH MINERALS) TABS tablet Take 1 tablet by mouth daily. Centrum Silver    . escitalopram (LEXAPRO) 5 MG tablet Take 5 mg by mouth daily.     No current facility-administered medications for this visit.    PHYSICAL EXAMINATION: ECOG PERFORMANCE STATUS: 0 - Asymptomatic  Vitals:   01/15/20 1121  BP: (!) 139/55  Pulse: 77  Resp: 18  Temp: 98.1 F (36.7 C)    SpO2: 99%   Filed Weights   01/15/20 1121  Weight: 111 lb (50.3 kg)    GENERAL:alert, no distress and comfortable SKIN: skin color, texture, turgor are normal, no rashes or significant lesions EYES: normal, Conjunctiva are pink and non-injected, sclera clear  NECK: supple, thyroid normal size, non-tender, without nodularity LYMPH:  no palpable lymphadenopathy in the cervical, axillary  LUNGS: clear to auscultation and percussion with normal breathing effort HEART: regular rate & rhythm and no murmurs and no lower extremity edema ABDOMEN:abdomen soft, non-tender and normal bowel sounds. No splenomegaly  Musculoskeletal:no cyanosis of digits and no clubbing  NEURO: alert & oriented x 3 with fluent speech, no focal motor/sensory deficits  LABORATORY DATA:  I have reviewed the data as listed CBC Latest Ref Rng & Units 03/28/2018 03/25/2018 11/29/2017  WBC 4.0 - 10.5 K/uL 3.8(L) 6.4 4.9  Hemoglobin 12.0 - 15.0 g/dL 11.8(L) 13.9 14.0  Hematocrit 36 - 46 % 38.2 42.5 43.8  Platelets 150 - 400 K/uL 188 211 267    CMP Latest Ref Rng & Units 03/28/2018 03/27/2018 03/25/2018  Glucose 70 - 99 mg/dL 99 104(H) 93  BUN 8 - 23 mg/dL 10 6(L) 10  Creatinine 0.44 - 1.00 mg/dL 0.60 0.65 0.49  Sodium 135 - 145 mmol/L 140 140 134(L)  Potassium 3.5 - 5.1 mmol/L 4.4 4.2 3.6  Chloride 98 - 111 mmol/L 109 106 104  CO2 22 - 32 mmol/L _0 Calcium 8.9 - 10.3 mg/dL 8.6(L) 8.9 8.7(L)  Total Protein 6.5 - 8.1 g/dL - - 6.0(L)  Total Bilirubin 0.3 - 1.2 mg/dL - - 0.3  Alkaline Phos 38 - 126 U/L - - 64  AST 15 - 41 U/L - - 28  ALT 0 - 44 U/L - - 31    OUTSIDE LABS:  12/24/19  WBC - 4.7 RBC - 4.01 HG - 14.3 HCT - 41.8% MCV - 104.3 MCH - 35.7  MCHC - 34.2  RDW - 13.6  PLT - 238k   06/24/19 WBC - 4.3 RBC - 3.98 HG - 13.8  HCT - 40.4 MCV - 101.4 MCH - 34.7 MCHC - 34.3  RDW - 12.4 PLT - 247k    12/22/18 WBC - 6.1 RBC -  3.97  HG - 13.6  HCT - 40.6 MCV - 102.1 MCH - 34.3 MCHC - 33.6   RDW - 12.5  PLT - 280K    07/31/18 WBC - 6.4 RBC - 4.32  HG - 14.6  HCT - 43.3  MCV - 100.3  MCH - 33.9  MCHC - 33.7 RDW - 12.4 PLT - 260K   RADIOGRAPHIC STUDIES: I have personally reviewed the radiological images as listed and agreed with the findings in the report. No results found.  ASSESSMENT & PLAN:  Karen Gomez is a 74 y.o. Caucasian female with a history of Anxiety, arthritis, HTN, S/p placement of pacemaker, GERD, Chronic back pain.    1. Macrocytosis  -Since 2017 her MCV has been slightly elevated. Her rest of CBC has been normal, no anemia  -I discussed possible etiology such as alcohol, medications, thyroid dysfunction, low B12 or Folate, liver disease or splenomegaly or Primary blood disorder such as MDS. Her prior labs were normal for TSH, B12 and folate. She is not on medications that can contribute to this, but does have daily 2 glasses of wine.  I think her macrocytosis is likely to alcohol, and I encouraged her to quit alcohol due to the other adverse effect on her overall health.  She did stop alcohol 2 to 3 weeks ago, I will repeat her CBC, B12 and folate today.  I will review her peripheral blood smear. -I discussed further evaluation of her liver and spleen with [place].  - I reviewed the role of bone marrow biopsy to evaluate for MDS and other primary bone marrow disease, if she develops worsening macrocytosis, or other cytopenias, I do not think she needs it now  -Physical exam unremarkable today.  -F/u open   2. Atypical Lobular hyperplasia -After abnormal mammogram in 03/2019 her biopsy showed fibrocystic changes with calcifications. She underwent lumpectomy with Dr Donne Hazel to remove this on 05/28/19 and was found to have [place].  -We reviewed the natural history of atypical hyperplasia. It is consider a benign breast disease, however it does increase the risk of breast cancer by 2-3 fold. It is considered as a high risk for breast cancer. -We discussed  annual screening mammogram, which will detect early stage breast cancer. She agrees to continue. She is very compliant on screening -She does have category C breast Density so I discussed option of additional screening with yearly MRIs. I discussing checking her insurance coverage for this. If not covered there is a fast MRI option with selfpay. She will think about it.  - I discussed the role of prophylactic chemoprevention with antiestrogen therapy to reduce her risk of breast cancer. Given her age and comorbidities (especially her heart disease), I do not strongly recommend it.  -Will continue breast cancer screening.    3. Comorbidities: Pacemaker in place, HTN, Chronic back pain -After repeated syncope she was have to have electrical issues as the cause. She now has PAC in place.  -Her HTN is well controlled  -Given DDD and spinal stenosis she has chronic lumbar and cervical pain. This is controlled with as needed spinal injections. She will continue to f/u with her Orthopedic surgeon.    PLAN:  -Lab today  -US Abdomen in 1-3 weeks  -will check if her pacemaker is MRI compatible  -f/u open    No orders of the defined types were placed in this encounter.   All questions were answered. The patient knows to call the clinic with any problems,  questions or concerns. The total time spent in the appointment was 40 minutes.     Truitt Merle, MD 01/15/2020 11:50 AM  I, Joslyn Devon, am acting as scribe for Truitt Merle, MD.   I have reviewed the above documentation for accuracy and completeness, and I agree with the above.

## 2020-01-14 DIAGNOSIS — R69 Illness, unspecified: Secondary | ICD-10-CM | POA: Diagnosis not present

## 2020-01-14 DIAGNOSIS — I10 Essential (primary) hypertension: Secondary | ICD-10-CM | POA: Diagnosis not present

## 2020-01-15 ENCOUNTER — Other Ambulatory Visit: Payer: Self-pay

## 2020-01-15 ENCOUNTER — Encounter: Payer: Self-pay | Admitting: Hematology

## 2020-01-15 ENCOUNTER — Inpatient Hospital Stay: Payer: Medicare HMO

## 2020-01-15 ENCOUNTER — Inpatient Hospital Stay: Payer: Medicare HMO | Attending: Hematology | Admitting: Hematology

## 2020-01-15 VITALS — BP 139/55 | HR 77 | Temp 98.1°F | Resp 18 | Ht 62.5 in | Wt 111.0 lb

## 2020-01-15 DIAGNOSIS — D7589 Other specified diseases of blood and blood-forming organs: Secondary | ICD-10-CM

## 2020-01-15 DIAGNOSIS — F329 Major depressive disorder, single episode, unspecified: Secondary | ICD-10-CM | POA: Insufficient documentation

## 2020-01-15 DIAGNOSIS — I10 Essential (primary) hypertension: Secondary | ICD-10-CM | POA: Diagnosis not present

## 2020-01-15 DIAGNOSIS — Z950 Presence of cardiac pacemaker: Secondary | ICD-10-CM | POA: Diagnosis not present

## 2020-01-15 DIAGNOSIS — Z79899 Other long term (current) drug therapy: Secondary | ICD-10-CM | POA: Insufficient documentation

## 2020-01-15 DIAGNOSIS — M549 Dorsalgia, unspecified: Secondary | ICD-10-CM | POA: Diagnosis not present

## 2020-01-15 DIAGNOSIS — R69 Illness, unspecified: Secondary | ICD-10-CM | POA: Diagnosis not present

## 2020-01-15 DIAGNOSIS — G8929 Other chronic pain: Secondary | ICD-10-CM | POA: Diagnosis not present

## 2020-01-15 LAB — CBC WITH DIFFERENTIAL (CANCER CENTER ONLY)
Abs Immature Granulocytes: 0.01 10*3/uL (ref 0.00–0.07)
Basophils Absolute: 0 10*3/uL (ref 0.0–0.1)
Basophils Relative: 0 %
Eosinophils Absolute: 0.1 10*3/uL (ref 0.0–0.5)
Eosinophils Relative: 2 %
HCT: 40.5 % (ref 36.0–46.0)
Hemoglobin: 13.3 g/dL (ref 12.0–15.0)
Immature Granulocytes: 0 %
Lymphocytes Relative: 19 %
Lymphs Abs: 1.1 10*3/uL (ref 0.7–4.0)
MCH: 34.4 pg — ABNORMAL HIGH (ref 26.0–34.0)
MCHC: 32.8 g/dL (ref 30.0–36.0)
MCV: 104.7 fL — ABNORMAL HIGH (ref 80.0–100.0)
Monocytes Absolute: 0.4 10*3/uL (ref 0.1–1.0)
Monocytes Relative: 7 %
Neutro Abs: 4.2 10*3/uL (ref 1.7–7.7)
Neutrophils Relative %: 72 %
Platelet Count: 220 10*3/uL (ref 150–400)
RBC: 3.87 MIL/uL (ref 3.87–5.11)
RDW: 11.7 % (ref 11.5–15.5)
WBC Count: 5.9 10*3/uL (ref 4.0–10.5)
nRBC: 0 % (ref 0.0–0.2)

## 2020-01-15 LAB — RETIC PANEL
Immature Retic Fract: 5.7 % (ref 2.3–15.9)
RBC.: 4.04 MIL/uL (ref 3.87–5.11)
Retic Count, Absolute: 29.9 10*3/uL (ref 19.0–186.0)
Retic Ct Pct: 0.7 % (ref 0.4–3.1)
Reticulocyte Hemoglobin: 37.4 pg (ref >27.9)

## 2020-01-15 LAB — TECHNOLOGIST SMEAR REVIEW
Plt Morphology: NORMAL
RBC Morphology: NORMAL

## 2020-01-15 LAB — VITAMIN B12: Vitamin B-12: 659 pg/mL (ref 180–914)

## 2020-01-15 LAB — SAVE SMEAR(SSMR), FOR PROVIDER SLIDE REVIEW

## 2020-01-16 ENCOUNTER — Encounter: Payer: Self-pay | Admitting: Hematology

## 2020-01-17 LAB — FOLATE RBC
Folate, Hemolysate: >620 ng/mL
Folate, RBC: >1435 ng/mL (ref >498)
Hematocrit: 43.2 % (ref 34.0–46.6)

## 2020-01-17 LAB — COPPER, SERUM: Copper: 121 ug/dL (ref 80–158)

## 2020-01-18 LAB — METHYLMALONIC ACID, SERUM: Methylmalonic Acid, Quantitative: 248 nmol/L (ref 0–378)

## 2020-01-19 ENCOUNTER — Telehealth: Payer: Self-pay

## 2020-01-19 ENCOUNTER — Encounter: Payer: Self-pay | Admitting: Hematology

## 2020-01-19 NOTE — Telephone Encounter (Signed)
I called and spoke with Ms. Cutsforth and notified her of normal labs expect for the mild macrocytosis which was the reason for the consult. Informed Ms. Dollens that she can try prenatal vit once daily and encourage her to quit alcohol completely and Ms. Burandt verbalized understanding. Informed Ms. Lopiccolo that Dr. Burr Medico would like to schedule an lab and f/u with her in 6- 9 months and Ms. Overbey agreed. Informed Ms. Crill that schedulers will reach out to her to schedule f/u with labs.  Sent schedulers a message to schedule f/u with lab in 6-9 months

## 2020-01-19 NOTE — Telephone Encounter (Signed)
-----   Message from Truitt Merle, MD sent at 01/19/2020  9:55 AM EDT ----- Please call pt and let her know lab results, which were all normal except mild macrocytosis which was the reason for consult. She can try prenatal vit once daily. Encourage her to quit alcohol completely and schedule lab and f/u in 6-9 months if she agrees. Thanks  Truitt Merle  01/19/2020

## 2020-01-20 ENCOUNTER — Telehealth: Payer: Self-pay | Admitting: Hematology

## 2020-01-20 NOTE — Telephone Encounter (Signed)
Scheduled appt per 8/3 sch msg - pt is aware of appt date and time

## 2020-01-21 DIAGNOSIS — M65341 Trigger finger, right ring finger: Secondary | ICD-10-CM | POA: Diagnosis not present

## 2020-01-21 DIAGNOSIS — M65331 Trigger finger, right middle finger: Secondary | ICD-10-CM | POA: Diagnosis not present

## 2020-02-03 ENCOUNTER — Other Ambulatory Visit: Payer: Self-pay

## 2020-02-03 ENCOUNTER — Ambulatory Visit (HOSPITAL_COMMUNITY)
Admission: RE | Admit: 2020-02-03 | Discharge: 2020-02-03 | Disposition: A | Discharge status: Home / Self Care | Payer: Medicare HMO | Source: Ambulatory Visit | Attending: Hematology | Admitting: Hematology

## 2020-02-03 DIAGNOSIS — R69 Illness, unspecified: Secondary | ICD-10-CM | POA: Diagnosis not present

## 2020-02-03 DIAGNOSIS — D7589 Other specified diseases of blood and blood-forming organs: Secondary | ICD-10-CM | POA: Diagnosis not present

## 2020-02-03 DIAGNOSIS — I10 Essential (primary) hypertension: Secondary | ICD-10-CM | POA: Diagnosis not present

## 2020-02-03 DIAGNOSIS — N1330 Unspecified hydronephrosis: Secondary | ICD-10-CM | POA: Diagnosis not present

## 2020-02-16 ENCOUNTER — Telehealth: Payer: Self-pay

## 2020-02-16 NOTE — Telephone Encounter (Signed)
Called patient to give [place] result. Left voicemail to return call to Dr. Ernestina Penna nurse at [phone].

## 2020-02-16 NOTE — Telephone Encounter (Signed)
-----   Message from Truitt Merle, MD sent at 02/12/2020  7:32 AM EDT ----- Please let her know the [place] result, suggest low fat diet and exercise, for fatty liver and check if she has stopped alcohol completely. Thanks   Truitt Merle  02/12/2020

## 2020-03-15 ENCOUNTER — Ambulatory Visit (INDEPENDENT_AMBULATORY_CARE_PROVIDER_SITE_OTHER): Payer: Medicare HMO | Admitting: Emergency Medicine

## 2020-03-15 DIAGNOSIS — R55 Syncope and collapse: Secondary | ICD-10-CM

## 2020-03-16 DIAGNOSIS — I10 Essential (primary) hypertension: Secondary | ICD-10-CM | POA: Diagnosis not present

## 2020-03-16 DIAGNOSIS — R69 Illness, unspecified: Secondary | ICD-10-CM | POA: Diagnosis not present

## 2020-03-16 LAB — CUP PACEART REMOTE DEVICE CHECK
Battery Remaining Longevity: 133 mo
Battery Voltage: 3.03 V
Brady Statistic AP VP Percent: 0.04 %
Brady Statistic AP VS Percent: 10.11 %
Brady Statistic AS VP Percent: 0.29 %
Brady Statistic AS VS Percent: 89.57 %
Brady Statistic RA Percent Paced: 10.12 %
Brady Statistic RV Percent Paced: 0.33 %
Date Time Interrogation Session: 20210928011146
Implantable Lead Implant Date: 20190614
Implantable Lead Implant Date: 20190614
Implantable Lead Location: 753859
Implantable Lead Location: 753860
Implantable Lead Model: 5076
Implantable Lead Model: 5076
Implantable Pulse Generator Implant Date: 20190614
Lead Channel Impedance Value: 266 Ohm
Lead Channel Impedance Value: 323 Ohm
Lead Channel Impedance Value: 380 Ohm
Lead Channel Impedance Value: 494 Ohm
Lead Channel Pacing Threshold Amplitude: 0.5 V
Lead Channel Pacing Threshold Amplitude: 1.5 V
Lead Channel Pacing Threshold Pulse Width: 0.4 ms
Lead Channel Pacing Threshold Pulse Width: 0.4 ms
Lead Channel Sensing Intrinsic Amplitude: 13.25 mV
Lead Channel Sensing Intrinsic Amplitude: 13.25 mV
Lead Channel Sensing Intrinsic Amplitude: 3.375 mV
Lead Channel Sensing Intrinsic Amplitude: 3.375 mV
Lead Channel Setting Pacing Amplitude: 2.5 V
Lead Channel Setting Pacing Amplitude: 3 V
Lead Channel Setting Pacing Pulse Width: 0.4 ms
Lead Channel Setting Sensing Sensitivity: 2.8 mV

## 2020-03-18 DIAGNOSIS — Z23 Encounter for immunization: Secondary | ICD-10-CM | POA: Diagnosis not present

## 2020-03-18 NOTE — Progress Notes (Signed)
Remote pacemaker transmission.   

## 2020-03-24 DIAGNOSIS — Z961 Presence of intraocular lens: Secondary | ICD-10-CM | POA: Diagnosis not present

## 2020-03-24 DIAGNOSIS — H5202 Hypermetropia, left eye: Secondary | ICD-10-CM | POA: Diagnosis not present

## 2020-03-24 DIAGNOSIS — H35371 Puckering of macula, right eye: Secondary | ICD-10-CM | POA: Diagnosis not present

## 2020-03-24 DIAGNOSIS — H26493 Other secondary cataract, bilateral: Secondary | ICD-10-CM | POA: Diagnosis not present

## 2020-03-31 DIAGNOSIS — R69 Illness, unspecified: Secondary | ICD-10-CM | POA: Diagnosis not present

## 2020-04-05 DIAGNOSIS — H26491 Other secondary cataract, right eye: Secondary | ICD-10-CM | POA: Diagnosis not present

## 2020-04-11 DIAGNOSIS — I10 Essential (primary) hypertension: Secondary | ICD-10-CM | POA: Diagnosis not present

## 2020-04-11 DIAGNOSIS — R69 Illness, unspecified: Secondary | ICD-10-CM | POA: Diagnosis not present

## 2020-04-26 DIAGNOSIS — H26492 Other secondary cataract, left eye: Secondary | ICD-10-CM | POA: Diagnosis not present

## 2020-04-28 DIAGNOSIS — M5136 Other intervertebral disc degeneration, lumbar region: Secondary | ICD-10-CM | POA: Diagnosis not present

## 2020-05-02 DIAGNOSIS — N952 Postmenopausal atrophic vaginitis: Secondary | ICD-10-CM | POA: Diagnosis not present

## 2020-05-02 DIAGNOSIS — Z01419 Encounter for gynecological examination (general) (routine) without abnormal findings: Secondary | ICD-10-CM | POA: Diagnosis not present

## 2020-05-02 DIAGNOSIS — Z6820 Body mass index (BMI) 20.0-20.9, adult: Secondary | ICD-10-CM | POA: Diagnosis not present

## 2020-05-02 DIAGNOSIS — L900 Lichen sclerosus et atrophicus: Secondary | ICD-10-CM | POA: Diagnosis not present

## 2020-05-02 DIAGNOSIS — M810 Age-related osteoporosis without current pathological fracture: Secondary | ICD-10-CM | POA: Diagnosis not present

## 2020-05-02 DIAGNOSIS — Z1231 Encounter for screening mammogram for malignant neoplasm of breast: Secondary | ICD-10-CM | POA: Diagnosis not present

## 2020-05-03 DIAGNOSIS — H35363 Drusen (degenerative) of macula, bilateral: Secondary | ICD-10-CM | POA: Diagnosis not present

## 2020-05-03 DIAGNOSIS — H35033 Hypertensive retinopathy, bilateral: Secondary | ICD-10-CM | POA: Diagnosis not present

## 2020-05-03 DIAGNOSIS — H35371 Puckering of macula, right eye: Secondary | ICD-10-CM | POA: Diagnosis not present

## 2020-05-03 DIAGNOSIS — H43822 Vitreomacular adhesion, left eye: Secondary | ICD-10-CM | POA: Diagnosis not present

## 2020-05-09 DIAGNOSIS — K219 Gastro-esophageal reflux disease without esophagitis: Secondary | ICD-10-CM | POA: Diagnosis not present

## 2020-05-09 DIAGNOSIS — I10 Essential (primary) hypertension: Secondary | ICD-10-CM | POA: Diagnosis not present

## 2020-05-09 DIAGNOSIS — R69 Illness, unspecified: Secondary | ICD-10-CM | POA: Diagnosis not present

## 2020-06-14 ENCOUNTER — Ambulatory Visit (INDEPENDENT_AMBULATORY_CARE_PROVIDER_SITE_OTHER): Payer: Medicare HMO

## 2020-06-14 DIAGNOSIS — R55 Syncope and collapse: Secondary | ICD-10-CM | POA: Diagnosis not present

## 2020-06-14 LAB — CUP PACEART REMOTE DEVICE CHECK
Battery Remaining Longevity: 127 mo
Battery Voltage: 3.02 V
Brady Statistic AP VP Percent: 1.8 %
Brady Statistic AP VS Percent: 5.56 %
Brady Statistic AS VP Percent: 56.5 %
Brady Statistic AS VS Percent: 36.13 %
Brady Statistic RA Percent Paced: 7.11 %
Brady Statistic RV Percent Paced: 58.31 %
Date Time Interrogation Session: 20211228001245
Implantable Lead Implant Date: 20190614
Implantable Lead Implant Date: 20190614
Implantable Lead Location: 753859
Implantable Lead Location: 753860
Implantable Lead Model: 5076
Implantable Lead Model: 5076
Implantable Pulse Generator Implant Date: 20190614
Lead Channel Impedance Value: 247 Ohm
Lead Channel Impedance Value: 323 Ohm
Lead Channel Impedance Value: 380 Ohm
Lead Channel Impedance Value: 437 Ohm
Lead Channel Pacing Threshold Amplitude: 0.5 V
Lead Channel Pacing Threshold Amplitude: 1.75 V
Lead Channel Pacing Threshold Pulse Width: 0.4 ms
Lead Channel Pacing Threshold Pulse Width: 0.4 ms
Lead Channel Sensing Intrinsic Amplitude: 13.25 mV
Lead Channel Sensing Intrinsic Amplitude: 13.25 mV
Lead Channel Sensing Intrinsic Amplitude: 3.25 mV
Lead Channel Sensing Intrinsic Amplitude: 3.25 mV
Lead Channel Setting Pacing Amplitude: 2.5 V
Lead Channel Setting Pacing Amplitude: 3.5 V
Lead Channel Setting Pacing Pulse Width: 0.4 ms
Lead Channel Setting Sensing Sensitivity: 2.8 mV

## 2020-06-24 DIAGNOSIS — M25512 Pain in left shoulder: Secondary | ICD-10-CM | POA: Diagnosis not present

## 2020-06-28 DIAGNOSIS — R69 Illness, unspecified: Secondary | ICD-10-CM | POA: Diagnosis not present

## 2020-06-28 DIAGNOSIS — Z950 Presence of cardiac pacemaker: Secondary | ICD-10-CM | POA: Diagnosis not present

## 2020-06-28 DIAGNOSIS — I10 Essential (primary) hypertension: Secondary | ICD-10-CM | POA: Diagnosis not present

## 2020-06-28 DIAGNOSIS — D7589 Other specified diseases of blood and blood-forming organs: Secondary | ICD-10-CM | POA: Diagnosis not present

## 2020-06-28 NOTE — Progress Notes (Signed)
Remote pacemaker transmission.   

## 2020-07-15 DIAGNOSIS — I10 Essential (primary) hypertension: Secondary | ICD-10-CM | POA: Diagnosis not present

## 2020-07-15 DIAGNOSIS — K219 Gastro-esophageal reflux disease without esophagitis: Secondary | ICD-10-CM | POA: Diagnosis not present

## 2020-07-15 DIAGNOSIS — R69 Illness, unspecified: Secondary | ICD-10-CM | POA: Diagnosis not present

## 2020-07-20 NOTE — Progress Notes (Signed)
[facility]   Telephone:(336) [phone] Fax:(336) [phone]   Clinic Follow up Note   Patient Care Team: Harlan Stains, MD as PCP - General (Family Medicine) Constance Haw, MD as PCP - Cardiology (Cardiology) Constance Haw, MD as PCP - Electrophysiology (Cardiology)  Date of Service:  07/22/2020  CHIEF COMPLAINT: F/u of Macrocytosis   CURRENT THERAPY:  Observation  INTERVAL HISTORY:  Karen Gomez is here for a follow up of Macrocytosis. She presents to the clinic alone. She notes she is doing very well. She denies fever, night, sweats, new pain, fatigue. She overall has no concerns. She notes she is on various supplements. I reviewed her medication list with her. She notes she plans to see cardiologist in May. She notes given pacemaker she has stipulations with getting MRIs. She notes she saw her Gyn in 04/2021.     REVIEW OF SYSTEMS:   Constitutional: Denies fevers, chills or abnormal weight loss Eyes: Denies blurriness of vision Ears, nose, mouth, throat, and face: Denies mucositis or sore throat Respiratory: Denies cough, dyspnea or wheezes Cardiovascular: Denies palpitation, chest discomfort or lower extremity swelling Gastrointestinal:  Denies nausea, heartburn or change in bowel habits Skin: Denies abnormal skin rashes Lymphatics: Denies new lymphadenopathy or easy bruising Neurological:Denies numbness, tingling or new weaknesses Behavioral/Psych: Mood is stable, no new changes  All other systems were reviewed with the patient and are negative.  MEDICAL HISTORY:  Past Medical History:  Diagnosis Date  . Anxiety    " husband passed unexpectedly in 2013" (11/29/2017)  . Arthritis    "right knee, hands, cervical & lumbar" (11/29/2017)  . DDD (degenerative disc disease), cervical   . DDD (degenerative disc disease), lumbar   . Degenerative scoliosis in adult patient   . GERD (gastroesophageal reflux disease)   . History of loop recorder-  removed 11/29/17 11/30/2017  . Hypertension   . Nerve pain    CYST IN LOWER LUMBAR SPINE  . Pneumonia 1990s X 1  . PONV (postoperative nausea and vomiting)    WHEN SHE HAD NECK SURGERY ?2002, HAD SOME N/V, NONE WITH HER OTHER SURGERIES  . Presence of permanent cardiac pacemaker 11/29/2017  . RBBB   . S/P placement of cardiac pacemaker 11/29/17 MDT 11/30/2017    SURGICAL HISTORY: Past Surgical History:  Procedure Laterality Date  . ANTERIOR CERVICAL DECOMP/DISCECTOMY FUSION  ~ 2002   PLACEMENT OF TITANIUM PLATE  . BACK SURGERY    . CATARACT EXTRACTION W/ INTRAOCULAR LENS  IMPLANT, BILATERAL    . CESAREAN SECTION  1973; 1977  . DILATION AND CURETTAGE OF UTERUS     PART OF UTERINE POLYP REMOVAL  . INSERT / REPLACE / REMOVE PACEMAKER  11/29/2017  . LEFT HEART CATH AND CORONARY ANGIOGRAPHY N/A 03/27/2018   Procedure: LEFT HEART CATH AND CORONARY ANGIOGRAPHY;  Surgeon: Troy Sine, MD;  Location: [facility];  Service: Cardiovascular;  Laterality: N/A;  . LOOP RECORDER INSERTION N/A 07/09/2017   Procedure: LOOP RECORDER INSERTION;  Surgeon: Constance Haw, MD;  Location: [facility];  Service: Cardiovascular;  Laterality: N/A;  . LOOP RECORDER REMOVAL  11/29/2017  . LOOP RECORDER REMOVAL N/A 11/29/2017   Procedure: LOOP RECORDER REMOVAL;  Surgeon: Constance Haw, MD;  Location: [facility];  Service: Cardiovascular;  Laterality: N/A;  . PACEMAKER IMPLANT N/A 11/29/2017   Procedure: PACEMAKER IMPLANT;  Surgeon: Constance Haw, MD;  Location: [facility];  Service: Cardiovascular;  Laterality:  N/A;  . RADIOACTIVE SEED GUIDED EXCISIONAL BREAST BIOPSY Right 05/28/2019   Procedure: RADIOACTIVE SEED GUIDED EXCISIONAL RIGHT  BREAST BIOPSY;  Surgeon: Rolm Bookbinder, MD;  Location: [facility];  Service: General;  Laterality: Right;  . REVERSE SHOULDER ARTHROPLASTY Right 06/28/2016   Procedure: REVERSE SHOULDER ARTHROPLASTY;  Surgeon:  Justice Britain, MD;  Location: [facility];  Service: Orthopedics;  Laterality: Right;  . TONSILLECTOMY  1940s  . UTERINE POLYPS      I have reviewed the social history and family history with the patient and they are unchanged from previous note.  ALLERGIES:  is allergic to chocolate and penicillins.  MEDICATIONS:  Current Outpatient Medications  Medication Sig Dispense Refill  . Ascorbic Acid (VITAMIN C) 1000 MG tablet Take 1,000 mg by mouth daily.    . calcium-vitamin D (OSCAL WITH D) 500-200 MG-UNIT tablet Take 1 tablet by mouth daily.    . cholecalciferol (VITAMIN D) 1000 units tablet Take 1,000 Units by mouth daily.    Marland Kitchen escitalopram (LEXAPRO) 10 MG tablet Take 10 mg by mouth daily.    Marland Kitchen escitalopram (LEXAPRO) 5 MG tablet Take 5 mg by mouth daily.    Marland Kitchen gabapentin (NEURONTIN) 300 MG capsule Take 300 mg by mouth at bedtime.     Marland Kitchen HYDROcodone-acetaminophen (NORCO) 10-325 MG tablet Take 0.5 tablets by mouth daily as needed for moderate pain.    . Magnesium 250 MG TABS Take 250 mg by mouth daily.    . Multiple Vitamin (MULTIVITAMIN WITH MINERALS) TABS tablet Take 1 tablet by mouth daily. Centrum Silver     No current facility-administered medications for this visit.    PHYSICAL EXAMINATION: ECOG PERFORMANCE STATUS: 0 - Asymptomatic  Vitals:   07/22/20 1006  BP: (!) 135/49  Pulse: (!) 57  Resp: 15  Temp: (!) 97 F (36.1 C)  SpO2: 100%   Filed Weights   07/22/20 1006  Weight: 111 lb 1.6 oz (50.4 kg)    Due to COVID19 we will limit examination to appearance. Patient had no complaints.  GENERAL:alert, no distress and comfortable SKIN: skin color normal, no rashes or significant lesions EYES: normal, Conjunctiva are pink and non-injected, sclera clear  NEURO: alert & oriented x 3 with fluent speech   LABORATORY DATA:  I have reviewed the data as listed CBC Latest Ref Rng & Units 07/22/2020 01/15/2020 01/15/2020  WBC 4.0 - 10.5 K/uL 4.9 5.9 -  Hemoglobin 12.0 - 15.0 g/dL 13.7 13.3  -  Hematocrit 36.0 - 46.0 % 42.0 43.2 40.5  Platelets 150 - 400 K/uL 216 220 -     CMP Latest Ref Rng & Units 03/28/2018 03/27/2018 03/25/2018  Glucose 70 - 99 mg/dL 99 104(H) 93  BUN 8 - 23 mg/dL 10 6(L) 10  Creatinine 0.44 - 1.00 mg/dL 0.60 0.65 0.49  Sodium 135 - 145 mmol/L 140 140 134(L)  Potassium 3.5 - 5.1 mmol/L 4.4 4.2 3.6  Chloride 98 - 111 mmol/L 109 106 104  CO2 22 - 32 mmol/L _0 Calcium 8.9 - 10.3 mg/dL 8.6(L) 8.9 8.7(L)  Total Protein 6.5 - 8.1 g/dL - - 6.0(L)  Total Bilirubin 0.3 - 1.2 mg/dL - - 0.3  Alkaline Phos 38 - 126 U/L - - 64  AST 15 - 41 U/L - - 28  ALT 0 - 44 U/L - - 31      RADIOGRAPHIC STUDIES: I have personally reviewed the radiological images as listed and agreed with the findings in the report.  No results found.   ASSESSMENT & PLAN:  ARITZA BRUNET is a 75 y.o. female with    1. Macrocytosis  -Since 2017 her MCV has been slightly elevated. Her rest of CBC has been normal, no anemia  -I previously discussed possible etiology such as alcohol, medications, thyroid dysfunction, low B12 or Folate, liver disease or splenomegaly or Primary blood disorder such as MDS. Her prior labs were normal for TSH, B12 and folate. She is not on medications that can contribute to this, but hade been having daily 2 glasses of wine.  -Her 01/2020 US abdomen was unremarkable except possible fatty liver.  -I think her macrocytosis is likely to alcohol, and I encouraged her to quit alcohol due to the other adverse effect on her overall health. I discussed the possibly of MDS which would be low risk MDS if this is -I previously discussed the role of bone marrow biopsy to evaluate for MDS and other primary bone marrow disease, if she develops worsening macrocytosis, or other cytopenias, I do not think she needs it at this point.  -She is clinically very stable. Labs reviewed, CBC WNL except MCV 103.7.  -I discussed she can monitor her labs with her PCP. If she becomes  anemic or have concerning labs, I may recommend bone marrow biopsy at that time.  -I will see her as needed in the future.    2. Atypical Lobular hyperplasia -After abnormal mammogram in 03/2019 her biopsy showed fibrocystic changes with calcifications. She underwent lumpectomy with Dr Donne Hazel to remove this on 05/28/19 and was found to have Birdseye.  -She has been counseled on the benign breast disease and her increased risk of future breast cancer. Chemoprevention with antiestrogen therapy was not recommended given her and comorbidities.  -She will continue closer breast cancer screening with yearly mammograms with Gyn and breast exams. I discussed the option of additional screening with annual breast MRIs. I also discussed Abbreviated MRIs which have $400 out-of-pocket cost if insurance does not cover this. She will think about MRIs.    3. Comorbidities: Pacemaker in place, HTN, Chronic back pain -After repeated syncope she was have to have electrical issues as the cause. She now has PAC in place.  -Her HTN is well controlled  -Given DDD and spinal stenosis she has chronic lumbar and cervical pain. This is controlled with as needed spinal injections. She will continue to f/u with her Orthopedic surgeon.    PLAN:  -I will see her as needed in the future  -will order breast screening MRI if she decides to do    No problem-specific Assessment & Plan notes found for this encounter.   No orders of the defined types were placed in this encounter.  All questions were answered. The patient knows to call the clinic with any problems, questions or concerns. No barriers to learning was detected. The total time spent in the appointment was 25 minutes.     Truitt Merle, MD 07/22/2020   I, Joslyn Devon, am acting as scribe for Truitt Merle, MD.   I have reviewed the above documentation for accuracy and completeness, and I agree with the above.

## 2020-07-21 ENCOUNTER — Other Ambulatory Visit: Payer: Self-pay

## 2020-07-21 DIAGNOSIS — D7589 Other specified diseases of blood and blood-forming organs: Secondary | ICD-10-CM

## 2020-07-22 ENCOUNTER — Inpatient Hospital Stay: Payer: BC Managed Care – PPO | Attending: Hematology | Admitting: Hematology

## 2020-07-22 ENCOUNTER — Encounter: Payer: Self-pay | Admitting: Hematology

## 2020-07-22 ENCOUNTER — Other Ambulatory Visit: Payer: Self-pay

## 2020-07-22 ENCOUNTER — Inpatient Hospital Stay: Payer: BC Managed Care – PPO

## 2020-07-22 DIAGNOSIS — Z79899 Other long term (current) drug therapy: Secondary | ICD-10-CM | POA: Diagnosis not present

## 2020-07-22 DIAGNOSIS — Z950 Presence of cardiac pacemaker: Secondary | ICD-10-CM | POA: Insufficient documentation

## 2020-07-22 DIAGNOSIS — I10 Essential (primary) hypertension: Secondary | ICD-10-CM | POA: Diagnosis not present

## 2020-07-22 DIAGNOSIS — D7589 Other specified diseases of blood and blood-forming organs: Secondary | ICD-10-CM | POA: Insufficient documentation

## 2020-07-22 DIAGNOSIS — G8929 Other chronic pain: Secondary | ICD-10-CM | POA: Diagnosis not present

## 2020-07-22 DIAGNOSIS — M549 Dorsalgia, unspecified: Secondary | ICD-10-CM | POA: Insufficient documentation

## 2020-07-22 LAB — CBC WITH DIFFERENTIAL (CANCER CENTER ONLY)
Abs Immature Granulocytes: 0.01 10*3/uL (ref 0.00–0.07)
Basophils Absolute: 0 10*3/uL (ref 0.0–0.1)
Basophils Relative: 1 %
Eosinophils Absolute: 0.2 10*3/uL (ref 0.0–0.5)
Eosinophils Relative: 5 %
HCT: 42 % (ref 36.0–46.0)
Hemoglobin: 13.7 g/dL (ref 12.0–15.0)
Immature Granulocytes: 0 %
Lymphocytes Relative: 16 %
Lymphs Abs: 0.8 10*3/uL (ref 0.7–4.0)
MCH: 33.8 pg (ref 26.0–34.0)
MCHC: 32.6 g/dL (ref 30.0–36.0)
MCV: 103.7 fL — ABNORMAL HIGH (ref 80.0–100.0)
Monocytes Absolute: 0.5 10*3/uL (ref 0.1–1.0)
Monocytes Relative: 9 %
Neutro Abs: 3.4 10*3/uL (ref 1.7–7.7)
Neutrophils Relative %: 69 %
Platelet Count: 216 10*3/uL (ref 150–400)
RBC: 4.05 MIL/uL (ref 3.87–5.11)
RDW: 12.2 % (ref 11.5–15.5)
WBC Count: 4.9 10*3/uL (ref 4.0–10.5)
nRBC: 0 % (ref 0.0–0.2)

## 2020-08-19 DIAGNOSIS — M5136 Other intervertebral disc degeneration, lumbar region: Secondary | ICD-10-CM | POA: Diagnosis not present

## 2020-08-30 DIAGNOSIS — H35371 Puckering of macula, right eye: Secondary | ICD-10-CM | POA: Diagnosis not present

## 2020-08-30 DIAGNOSIS — H35363 Drusen (degenerative) of macula, bilateral: Secondary | ICD-10-CM | POA: Diagnosis not present

## 2020-08-30 DIAGNOSIS — H43822 Vitreomacular adhesion, left eye: Secondary | ICD-10-CM | POA: Diagnosis not present

## 2020-08-30 DIAGNOSIS — H35033 Hypertensive retinopathy, bilateral: Secondary | ICD-10-CM | POA: Diagnosis not present

## 2020-09-13 ENCOUNTER — Ambulatory Visit (INDEPENDENT_AMBULATORY_CARE_PROVIDER_SITE_OTHER): Payer: Medicare HMO

## 2020-09-13 DIAGNOSIS — I442 Atrioventricular block, complete: Secondary | ICD-10-CM | POA: Diagnosis not present

## 2020-09-13 LAB — CUP PACEART REMOTE DEVICE CHECK
Battery Remaining Longevity: 125 mo
Battery Voltage: 3.03 V
Brady Statistic AP VP Percent: 0.6 %
Brady Statistic AP VS Percent: 7.11 %
Brady Statistic AS VP Percent: 18.54 %
Brady Statistic AS VS Percent: 73.75 %
Brady Statistic RA Percent Paced: 7.47 %
Brady Statistic RV Percent Paced: 19.15 %
Date Time Interrogation Session: 20220329011146
Implantable Lead Implant Date: 20190614
Implantable Lead Implant Date: 20190614
Implantable Lead Location: 753859
Implantable Lead Location: 753860
Implantable Lead Model: 5076
Implantable Lead Model: 5076
Implantable Pulse Generator Implant Date: 20190614
Lead Channel Impedance Value: 247 Ohm
Lead Channel Impedance Value: 304 Ohm
Lead Channel Impedance Value: 361 Ohm
Lead Channel Impedance Value: 456 Ohm
Lead Channel Pacing Threshold Amplitude: 0.375 V
Lead Channel Pacing Threshold Amplitude: 1.5 V
Lead Channel Pacing Threshold Pulse Width: 0.4 ms
Lead Channel Pacing Threshold Pulse Width: 0.4 ms
Lead Channel Sensing Intrinsic Amplitude: 15.125 mV
Lead Channel Sensing Intrinsic Amplitude: 15.125 mV
Lead Channel Sensing Intrinsic Amplitude: 4.75 mV
Lead Channel Sensing Intrinsic Amplitude: 4.75 mV
Lead Channel Setting Pacing Amplitude: 2.5 V
Lead Channel Setting Pacing Amplitude: 3 V
Lead Channel Setting Pacing Pulse Width: 0.4 ms
Lead Channel Setting Sensing Sensitivity: 2.8 mV

## 2020-09-27 DIAGNOSIS — M25512 Pain in left shoulder: Secondary | ICD-10-CM | POA: Diagnosis not present

## 2020-09-27 NOTE — Progress Notes (Signed)
Remote pacemaker transmission.   

## 2020-10-13 DIAGNOSIS — M25512 Pain in left shoulder: Secondary | ICD-10-CM | POA: Diagnosis not present

## 2020-10-17 DIAGNOSIS — M25512 Pain in left shoulder: Secondary | ICD-10-CM | POA: Diagnosis not present

## 2020-10-27 ENCOUNTER — Encounter: Payer: Self-pay | Admitting: Cardiology

## 2020-10-27 ENCOUNTER — Ambulatory Visit (INDEPENDENT_AMBULATORY_CARE_PROVIDER_SITE_OTHER): Payer: Medicare HMO | Admitting: Cardiology

## 2020-10-27 ENCOUNTER — Other Ambulatory Visit: Payer: Self-pay

## 2020-10-27 VITALS — BP 122/68 | HR 56 | Ht 62.5 in | Wt 113.0 lb

## 2020-10-27 DIAGNOSIS — R55 Syncope and collapse: Secondary | ICD-10-CM

## 2020-10-27 DIAGNOSIS — I495 Sick sinus syndrome: Secondary | ICD-10-CM

## 2020-10-27 NOTE — Progress Notes (Signed)
Electrophysiology Office Note   Date:  10/27/2020   ID:  Karen Gomez 1945-11-25, MRN [id number]  PCP:  Harlan Stains, MD Primary Electrophysiologist:  Lovella Hardie Meredith Leeds, MD    No chief complaint on file.    History of Present Illness: Karen Gomez is a 75 y.o. female who presents today for electrophysiology evaluation.   She has a history of hypertension.   She has had multiple episodes of syncope.  She was waiting in line to see Gaylyn Rong at [place] time.  She is waiting in line for approximately 20 minutes.  Per her family she slumped down and sustained no trauma.  She was unconscious for 5 to 10 seconds.  A few days later, she had more episodes.  She was standing in line for multiple hours with her grandchildren.  She had 2 other episodes of syncope while standing.  She is now status post Medtronic dual-chamber pacemaker implanted 11/29/2017 for intermittent complete heart block.  Today, denies symptoms of palpitations, chest pain, shortness of breath, orthopnea, PND, lower extremity edema, claudication, dizziness, presyncope, syncope, bleeding, or neurologic sequela. The patient is tolerating medications without difficulties.  She overall feels well.  He has no chest pain or shortness of breath.  She has no complaints at this time.  She is able to do all of her daily activities and is without restriction.  Past Medical History:  Diagnosis Date  . Anxiety    " husband passed unexpectedly in 2013" (11/29/2017)  . Arthritis    "right knee, hands, cervical & lumbar" (11/29/2017)  . DDD (degenerative disc disease), cervical   . DDD (degenerative disc disease), lumbar   . Degenerative scoliosis in adult patient   . GERD (gastroesophageal reflux disease)   . History of loop recorder- removed 11/29/17 11/30/2017  . Hypertension   . Nerve pain    CYST IN LOWER LUMBAR SPINE  . Pneumonia 1990s X 1  . PONV (postoperative nausea and vomiting)    WHEN SHE HAD NECK SURGERY ?2002,  HAD SOME N/V, NONE WITH HER OTHER SURGERIES  . Presence of permanent cardiac pacemaker 11/29/2017  . RBBB   . S/P placement of cardiac pacemaker 11/29/17 MDT 11/30/2017   Past Surgical History:  Procedure Laterality Date  . ANTERIOR CERVICAL DECOMP/DISCECTOMY FUSION  ~ 2002   PLACEMENT OF TITANIUM PLATE  . BACK SURGERY    . CATARACT EXTRACTION W/ INTRAOCULAR LENS  IMPLANT, BILATERAL    . CESAREAN SECTION  1973; 1977  . DILATION AND CURETTAGE OF UTERUS     PART OF UTERINE POLYP REMOVAL  . INSERT / REPLACE / REMOVE PACEMAKER  11/29/2017  . LEFT HEART CATH AND CORONARY ANGIOGRAPHY N/A 03/27/2018   Procedure: LEFT HEART CATH AND CORONARY ANGIOGRAPHY;  Surgeon: Troy Sine, MD;  Location: [facility];  Service: Cardiovascular;  Laterality: N/A;  . LOOP RECORDER INSERTION N/A 07/09/2017   Procedure: LOOP RECORDER INSERTION;  Surgeon: Constance Haw, MD;  Location: [facility];  Service: Cardiovascular;  Laterality: N/A;  . LOOP RECORDER REMOVAL  11/29/2017  . LOOP RECORDER REMOVAL N/A 11/29/2017   Procedure: LOOP RECORDER REMOVAL;  Surgeon: Constance Haw, MD;  Location: [facility];  Service: Cardiovascular;  Laterality: N/A;  . PACEMAKER IMPLANT N/A 11/29/2017   Procedure: PACEMAKER IMPLANT;  Surgeon: Constance Haw, MD;  Location: [facility];  Service: Cardiovascular;  Laterality: N/A;  . RADIOACTIVE SEED GUIDED EXCISIONAL BREAST BIOPSY Right 05/28/2019  Procedure: RADIOACTIVE SEED GUIDED EXCISIONAL RIGHT  BREAST BIOPSY;  Surgeon: Rolm Bookbinder, MD;  Location: [facility];  Service: General;  Laterality: Right;  . REVERSE SHOULDER ARTHROPLASTY Right 06/28/2016   Procedure: REVERSE SHOULDER ARTHROPLASTY;  Surgeon: Justice Britain, MD;  Location: [facility];  Service: Orthopedics;  Laterality: Right;  . TONSILLECTOMY  1940s  . UTERINE POLYPS       Current Outpatient Medications  Medication Sig Dispense Refill  . alendronate  (FOSAMAX) 70 MG tablet Take 70 mg by mouth once a week.    . Ascorbic Acid (VITAMIN C) 1000 MG tablet Take 1,000 mg by mouth daily.    . calcium-vitamin D (OSCAL WITH D) 500-200 MG-UNIT tablet Take 1 tablet by mouth daily.    . cholecalciferol (VITAMIN D) 1000 units tablet Take 1,000 Units by mouth daily.    Marland Kitchen escitalopram (LEXAPRO) 10 MG tablet Take 10 mg by mouth daily.    Marland Kitchen gabapentin (NEURONTIN) 300 MG capsule Take 300 mg by mouth at bedtime.     Marland Kitchen HYDROcodone-acetaminophen (NORCO) 10-325 MG tablet Take 0.5 tablets by mouth daily as needed for moderate pain.    Marland Kitchen lisinopril (ZESTRIL) 5 MG tablet Take 5 mg by mouth daily.    . Magnesium 250 MG TABS Take 250 mg by mouth daily.    . Multiple Vitamin (MULTIVITAMIN WITH MINERALS) TABS tablet Take 1 tablet by mouth daily. Centrum Silver     No current facility-administered medications for this visit.    Allergies:   Chocolate and Penicillins   Social History:  The patient  reports that she has never smoked. She has never used smokeless tobacco. She reports current alcohol use of about 14.0 standard drinks of alcohol per week. She reports that she does not use drugs.   Family History:  The patient's family history includes Dementia in her mother; Heart disease in her brother and father.   ROS:  Please see the history of present illness.   Otherwise, review of systems is positive for none.   All other systems are reviewed and negative.   PHYSICAL EXAM: VS:  BP 122/68   Pulse (!) 56   Ht 5' 2.5" (1.588 m)   Wt 113 lb (51.3 kg)   BMI 20.34 kg/m  , BMI Body mass index is 20.34 kg/m. GEN: Well nourished, well developed, in no acute distress  HEENT: normal  Neck: no JVD, carotid bruits, or masses Cardiac: RRR; no murmurs, rubs, or gallops,no edema  Respiratory:  clear to auscultation bilaterally, normal work of breathing GI: soft, nontender, nondistended, + BS MS: no deformity or atrophy  Skin: warm and dry, device site well  healed Neuro:  Strength and sensation are intact Psych: euthymic mood, full affect  EKG:  EKG is ordered today. Personal review of the ekg ordered shows sinus rhythm, right bundle branch block, left anterior fascicular block  Personal review of the device interrogation today. Results in [facility]: 07/22/2020: Hemoglobin 13.7; Platelet Count 216    Lipid Panel     Component Value Date/Time   CHOL 179 03/26/2018 0550   TRIG 49 03/26/2018 0550   HDL 66 03/26/2018 0550   CHOLHDL 2.7 03/26/2018 0550   VLDL 10 03/26/2018 0550   LDLCALC 103 (H) 03/26/2018 0550     Wt Readings from Last 3 Encounters:  10/27/20 113 lb (51.3 kg)  07/22/20 111 lb 1.6 oz (50.4 kg)  01/15/20 111 lb (50.3 kg)      Other  studies Reviewed: Additional studies/ records that were reviewed today include: LHC 07/03/18 Review of the above records today demonstrates:  - Left ventricle: The cavity size was normal. Systolic function was  normal. The estimated ejection fraction was in the range of 55%  to 60%. Wall motion was normal; there were no regional wall  motion abnormalities. Doppler parameters are consistent with  abnormal left ventricular relaxation (grade 1 diastolic  dysfunction).  - Aortic valve: There was no regurgitation.  - Mitral valve: Transvalvular velocity was within the normal range.  There was no evidence for stenosis. There was no regurgitation.  - Left atrium: The atrium was mildly dilated.  - Right ventricle: The cavity size was normal. Wall thickness was  normal. Systolic function was normal.  - Tricuspid valve: There was no regurgitation.  - Pulmonary arteries: Systolic pressure could not be accurately  estimated.    ASSESSMENT AND PLAN:  1.  Syncope with complete heart block: Status post Medtronic dual-chamber pacemaker implanted 11/29/2017.  Device functioning appropriately.  No changes at this time.    2.  Right bundle branch block, left anterior  fascicular block: Has remained stable.  No changes.  3.  Takotsubo cardiomyopathy: Ejection fraction at this normalized.  No changes.   Current medicines are reviewed at length with the patient today.   The patient does not have concerns regarding her medicines.  The following changes were made today: none  Labs/ tests ordered today include:  Orders Placed This Encounter  Procedures  . EKG 12-Lead  . EKG 12-Lead     Disposition:   FU with Zelig Gacek 12 months  Signed, Clayborn Milnes Meredith Leeds, MD  10/27/2020 2:15 PM     [address] [place] [address] [phone] (office) [phone] (fa

## 2020-10-27 NOTE — Patient Instructions (Signed)
Medication Instructions:  Your physician recommends that you continue on your current medications as directed. Please refer to the Current Medication list given to you today.  *If you need a refill on your cardiac medications before your next appointment, please call your pharmacy*   Lab Work: None ordered   Testing/Procedures: None ordered   Follow-Up: At [facility], you and your health needs are our priority.  As part of our continuing mission to provide you with exceptional heart care, we have created designated Provider Care Teams.  These Care Teams include your primary Cardiologist (physician) and Advanced Practice Providers (APPs -  Physician Assistants and Nurse Practitioners) who all work together to provide you with the care you need, when you need it.  Remote monitoring is used to monitor your Pacemaker or ICD from home. This monitoring reduces the number of office visits required to check your device to one time per year. It allows [place] to keep an eye on the functioning of your device to ensure it is working properly. You are scheduled for a device check from home on 12/13/2020. You may send your transmission at any time that day. If you have a wireless device, the transmission will be sent automatically. After your physician reviews your transmission, you will receive a postcard with your next transmission date.  Your next appointment:   1 year(s)  The format for your next appointment:   In Person  Provider:   Allegra Lai, MD   Thank you for choosing [facility]!!   [name], RN [phone]

## 2020-12-13 ENCOUNTER — Ambulatory Visit (INDEPENDENT_AMBULATORY_CARE_PROVIDER_SITE_OTHER): Payer: Medicare HMO

## 2020-12-13 DIAGNOSIS — I442 Atrioventricular block, complete: Secondary | ICD-10-CM

## 2020-12-13 LAB — CUP PACEART REMOTE DEVICE CHECK
Battery Remaining Longevity: 125 mo
Battery Voltage: 3.02 V
Brady Statistic AP VP Percent: 0.01 %
Brady Statistic AP VS Percent: 7.43 %
Brady Statistic AS VP Percent: 0.27 %
Brady Statistic AS VS Percent: 92.3 %
Brady Statistic RA Percent Paced: 7.43 %
Brady Statistic RV Percent Paced: 0.28 %
Date Time Interrogation Session: 20220628011208
Implantable Lead Implant Date: 20190614
Implantable Lead Implant Date: 20190614
Implantable Lead Location: 753859
Implantable Lead Location: 753860
Implantable Lead Model: 5076
Implantable Lead Model: 5076
Implantable Pulse Generator Implant Date: 20190614
Lead Channel Impedance Value: 266 Ohm
Lead Channel Impedance Value: 323 Ohm
Lead Channel Impedance Value: 380 Ohm
Lead Channel Impedance Value: 418 Ohm
Lead Channel Pacing Threshold Amplitude: 0.375 V
Lead Channel Pacing Threshold Amplitude: 1.375 V
Lead Channel Pacing Threshold Pulse Width: 0.4 ms
Lead Channel Pacing Threshold Pulse Width: 0.4 ms
Lead Channel Sensing Intrinsic Amplitude: 12.875 mV
Lead Channel Sensing Intrinsic Amplitude: 12.875 mV
Lead Channel Sensing Intrinsic Amplitude: 3.125 mV
Lead Channel Sensing Intrinsic Amplitude: 3.125 mV
Lead Channel Setting Pacing Amplitude: 2.5 V
Lead Channel Setting Pacing Amplitude: 2.75 V
Lead Channel Setting Pacing Pulse Width: 0.4 ms
Lead Channel Setting Sensing Sensitivity: 2.8 mV

## 2021-01-03 NOTE — Progress Notes (Signed)
Remote pacemaker transmission.   

## 2021-01-04 DIAGNOSIS — Z950 Presence of cardiac pacemaker: Secondary | ICD-10-CM | POA: Diagnosis not present

## 2021-01-04 DIAGNOSIS — Z0000 Encounter for general adult medical examination without abnormal findings: Secondary | ICD-10-CM | POA: Diagnosis not present

## 2021-01-04 DIAGNOSIS — K580 Irritable bowel syndrome with diarrhea: Secondary | ICD-10-CM | POA: Diagnosis not present

## 2021-01-04 DIAGNOSIS — G894 Chronic pain syndrome: Secondary | ICD-10-CM | POA: Diagnosis not present

## 2021-01-04 DIAGNOSIS — I10 Essential (primary) hypertension: Secondary | ICD-10-CM | POA: Diagnosis not present

## 2021-01-04 DIAGNOSIS — M5030 Other cervical disc degeneration, unspecified cervical region: Secondary | ICD-10-CM | POA: Diagnosis not present

## 2021-01-04 DIAGNOSIS — R69 Illness, unspecified: Secondary | ICD-10-CM | POA: Diagnosis not present

## 2021-01-04 DIAGNOSIS — D7589 Other specified diseases of blood and blood-forming organs: Secondary | ICD-10-CM | POA: Diagnosis not present

## 2021-02-03 DIAGNOSIS — H35341 Macular cyst, hole, or pseudohole, right eye: Secondary | ICD-10-CM | POA: Diagnosis not present

## 2021-02-03 DIAGNOSIS — H43822 Vitreomacular adhesion, left eye: Secondary | ICD-10-CM | POA: Diagnosis not present

## 2021-02-03 DIAGNOSIS — H35371 Puckering of macula, right eye: Secondary | ICD-10-CM | POA: Diagnosis not present

## 2021-02-03 DIAGNOSIS — H35363 Drusen (degenerative) of macula, bilateral: Secondary | ICD-10-CM | POA: Diagnosis not present

## 2021-02-08 DIAGNOSIS — M5416 Radiculopathy, lumbar region: Secondary | ICD-10-CM | POA: Diagnosis not present

## 2021-02-13 ENCOUNTER — Encounter: Payer: Self-pay | Admitting: Cardiology

## 2021-02-13 NOTE — Progress Notes (Signed)
[facility]GRAMM[facility]  Patient Information: Name:  Karen Gomez  DOB:  [birth year]  MRN:  [id number]    Request received via fax.    Planned procedure: Right Vitrectomy Surgeon: Corliss Parish Date of Procedure: 02/16/21  Completed form faxed to [facility] Device Information:  Clinic EP Physician:  Allegra Lai, MD   Device Type:  Pacemaker Manufacturer and Phone #:  Medtronic: [id number] Pacemaker Dependent?:  No. Date of Last Device Check:  12/13/20 Normal Device Function?:  Yes.    Electrophysiologist's Recommendations:  Have magnet available. Provide continuous ECG monitoring when magnet is used or reprogramming is to be performed.  Procedure may interfere with device function.  Magnet should be placed over device during procedure.  Per Device Clinic Standing Orders, York Ram, RN  4:49 PM 02/13/2021

## 2021-02-16 DIAGNOSIS — H35341 Macular cyst, hole, or pseudohole, right eye: Secondary | ICD-10-CM | POA: Diagnosis not present

## 2021-02-17 DIAGNOSIS — H35341 Macular cyst, hole, or pseudohole, right eye: Secondary | ICD-10-CM | POA: Diagnosis not present

## 2021-02-24 DIAGNOSIS — H35341 Macular cyst, hole, or pseudohole, right eye: Secondary | ICD-10-CM | POA: Diagnosis not present

## 2021-03-14 ENCOUNTER — Ambulatory Visit (INDEPENDENT_AMBULATORY_CARE_PROVIDER_SITE_OTHER): Payer: Medicare HMO

## 2021-03-14 DIAGNOSIS — I442 Atrioventricular block, complete: Secondary | ICD-10-CM | POA: Diagnosis not present

## 2021-03-14 LAB — CUP PACEART REMOTE DEVICE CHECK
Battery Remaining Longevity: 124 mo
Battery Voltage: 3.02 V
Brady Statistic AP VP Percent: 0.01 %
Brady Statistic AP VS Percent: 5.67 %
Brady Statistic AS VP Percent: 0.04 %
Brady Statistic AS VS Percent: 94.29 %
Brady Statistic RA Percent Paced: 5.66 %
Brady Statistic RV Percent Paced: 0.05 %
Date Time Interrogation Session: 20220927011313
Implantable Lead Implant Date: 20190614
Implantable Lead Implant Date: 20190614
Implantable Lead Location: 753859
Implantable Lead Location: 753860
Implantable Lead Model: 5076
Implantable Lead Model: 5076
Implantable Pulse Generator Implant Date: 20190614
Lead Channel Impedance Value: 266 Ohm
Lead Channel Impedance Value: 304 Ohm
Lead Channel Impedance Value: 380 Ohm
Lead Channel Impedance Value: 437 Ohm
Lead Channel Pacing Threshold Amplitude: 0.375 V
Lead Channel Pacing Threshold Amplitude: 1.25 V
Lead Channel Pacing Threshold Pulse Width: 0.4 ms
Lead Channel Pacing Threshold Pulse Width: 0.4 ms
Lead Channel Sensing Intrinsic Amplitude: 13 mV
Lead Channel Sensing Intrinsic Amplitude: 13 mV
Lead Channel Sensing Intrinsic Amplitude: 2.75 mV
Lead Channel Sensing Intrinsic Amplitude: 2.75 mV
Lead Channel Setting Pacing Amplitude: 2.5 V
Lead Channel Setting Pacing Amplitude: 2.5 V
Lead Channel Setting Pacing Pulse Width: 0.4 ms
Lead Channel Setting Sensing Sensitivity: 2.8 mV

## 2021-03-17 DIAGNOSIS — H35341 Macular cyst, hole, or pseudohole, right eye: Secondary | ICD-10-CM | POA: Diagnosis not present

## 2021-03-22 NOTE — Progress Notes (Signed)
Remote pacemaker transmission.   

## 2021-03-23 DIAGNOSIS — Z23 Encounter for immunization: Secondary | ICD-10-CM | POA: Diagnosis not present

## 2021-04-06 DIAGNOSIS — M5416 Radiculopathy, lumbar region: Secondary | ICD-10-CM | POA: Diagnosis not present

## 2021-05-02 DIAGNOSIS — M47812 Spondylosis without myelopathy or radiculopathy, cervical region: Secondary | ICD-10-CM | POA: Diagnosis not present

## 2021-06-13 ENCOUNTER — Ambulatory Visit (INDEPENDENT_AMBULATORY_CARE_PROVIDER_SITE_OTHER): Payer: Medicare HMO

## 2021-06-13 DIAGNOSIS — I442 Atrioventricular block, complete: Secondary | ICD-10-CM | POA: Diagnosis not present

## 2021-06-14 LAB — CUP PACEART REMOTE DEVICE CHECK
Battery Remaining Longevity: 121 mo
Battery Voltage: 3.02 V
Brady Statistic AP VP Percent: 0 %
Brady Statistic AP VS Percent: 6.11 %
Brady Statistic AS VP Percent: 0.03 %
Brady Statistic AS VS Percent: 93.86 %
Brady Statistic RA Percent Paced: 6.1 %
Brady Statistic RV Percent Paced: 0.04 %
Date Time Interrogation Session: 20221228000606
Implantable Lead Implant Date: 20190614
Implantable Lead Implant Date: 20190614
Implantable Lead Location: 753859
Implantable Lead Location: 753860
Implantable Lead Model: 5076
Implantable Lead Model: 5076
Implantable Pulse Generator Implant Date: 20190614
Lead Channel Impedance Value: 247 Ohm
Lead Channel Impedance Value: 304 Ohm
Lead Channel Impedance Value: 361 Ohm
Lead Channel Impedance Value: 456 Ohm
Lead Channel Pacing Threshold Amplitude: 0.375 V
Lead Channel Pacing Threshold Amplitude: 1.375 V
Lead Channel Pacing Threshold Pulse Width: 0.4 ms
Lead Channel Pacing Threshold Pulse Width: 0.4 ms
Lead Channel Sensing Intrinsic Amplitude: 12.625 mV
Lead Channel Sensing Intrinsic Amplitude: 12.625 mV
Lead Channel Sensing Intrinsic Amplitude: 2.75 mV
Lead Channel Sensing Intrinsic Amplitude: 2.75 mV
Lead Channel Setting Pacing Amplitude: 2.5 V
Lead Channel Setting Pacing Amplitude: 2.75 V
Lead Channel Setting Pacing Pulse Width: 0.4 ms
Lead Channel Setting Sensing Sensitivity: 2.8 mV

## 2021-06-16 DIAGNOSIS — M8580 Other specified disorders of bone density and structure, unspecified site: Secondary | ICD-10-CM | POA: Diagnosis not present

## 2021-06-16 DIAGNOSIS — Z01419 Encounter for gynecological examination (general) (routine) without abnormal findings: Secondary | ICD-10-CM | POA: Diagnosis not present

## 2021-06-16 DIAGNOSIS — M7542 Impingement syndrome of left shoulder: Secondary | ICD-10-CM | POA: Diagnosis not present

## 2021-06-16 DIAGNOSIS — N952 Postmenopausal atrophic vaginitis: Secondary | ICD-10-CM | POA: Diagnosis not present

## 2021-06-16 DIAGNOSIS — L900 Lichen sclerosus et atrophicus: Secondary | ICD-10-CM | POA: Diagnosis not present

## 2021-06-16 DIAGNOSIS — Z6820 Body mass index (BMI) 20.0-20.9, adult: Secondary | ICD-10-CM | POA: Diagnosis not present

## 2021-06-16 DIAGNOSIS — Z1231 Encounter for screening mammogram for malignant neoplasm of breast: Secondary | ICD-10-CM | POA: Diagnosis not present

## 2021-06-22 NOTE — Progress Notes (Signed)
Remote pacemaker transmission.   

## 2021-06-27 ENCOUNTER — Telehealth: Payer: Self-pay | Admitting: *Deleted

## 2021-06-27 NOTE — Telephone Encounter (Signed)
° °  Pre-operative Risk Assessment    Patient Name: Karen Gomez  DOB: 1945-12-30 MRN: [id number]      Request for Surgical Clearance    Procedure:   LEFT REVERSE SHOULDER ARTHROPLASTY  Date of Surgery:  Clearance 07/20/21                                 Surgeon:  DR. Lennette Bihari SUPPLE Surgeon's Group or Practice Name:  [facility] Phone number:  [phone] Fax number:  [phone] ATTN: [place] [facility]   Type of Clearance Requested:   - Medical    Type of Anesthesia:  General    Additional requests/questions:    Jiles Prows   06/27/2021, 2:16 PM

## 2021-06-27 NOTE — Telephone Encounter (Signed)
Primary [place], MD  Chart reviewed as part of pre-operative protocol coverage. Because of Karen Gomez past medical history and time since last visit, he/she will require a follow-up visit in order to better assess preoperative cardiovascular risk.  Pre-op covering staff: - Please schedule appointment and call patient to inform them. - Please contact requesting surgeon's office via preferred method (i.e, phone, fax) to inform them of need for appointment prior to surgery.  If applicable, this message will also be routed to pharmacy pool and/or primary cardiologist for input on holding anticoagulant/antiplatelet agent as requested below so that this information is available at time of patient's appointment.   Deberah Pelton, NP  06/27/2021, 2:29 PM

## 2021-06-27 NOTE — Telephone Encounter (Signed)
Will send a message to [place], EP scheduler to reach out to the pt with an appt for pre op clearance.

## 2021-06-28 IMAGING — MG MM PLC BREAST LOC DEV 1ST LESION INC*R*
7 series · 7 of 7 positions shown · non-contrast
Comparison: Previous exam(s).

CLINICAL DATA: 73-year-old female presenting for radioactive seed
localization of the right breast prior to excisional biopsy.

EXAM:
MAMMOGRAPHIC GUIDED RADIOACTIVE SEED LOCALIZATION OF THE RIGHT
BREAST

[R ML (1 of 4)]
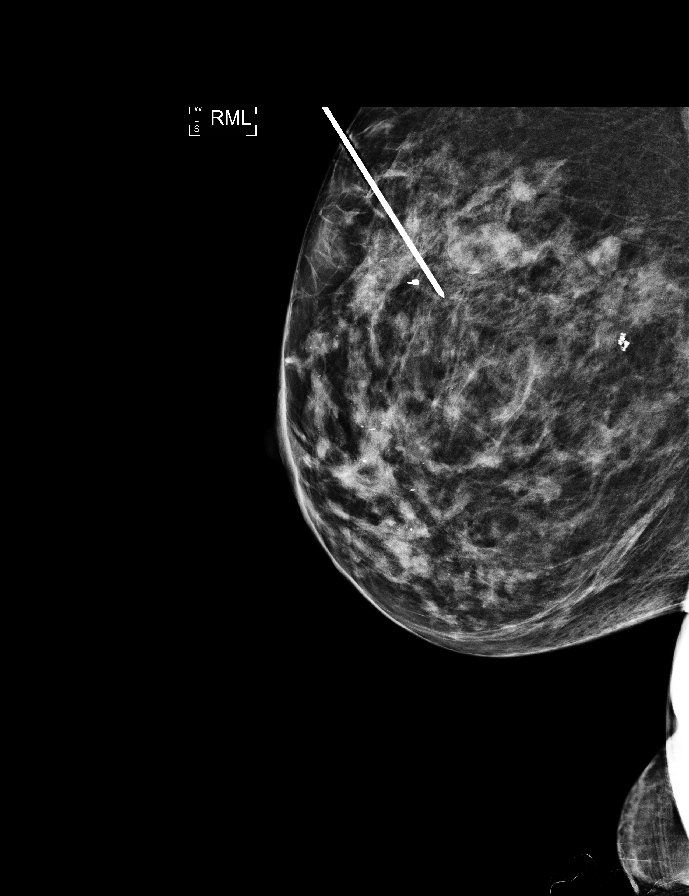

[R ML (2 of 4)]
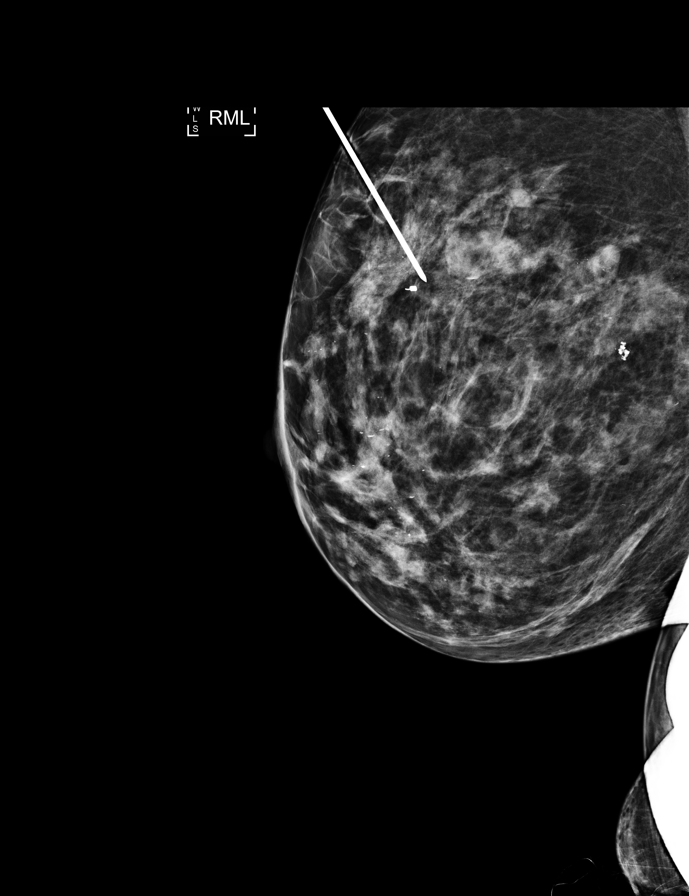

[R ML (3 of 4)]
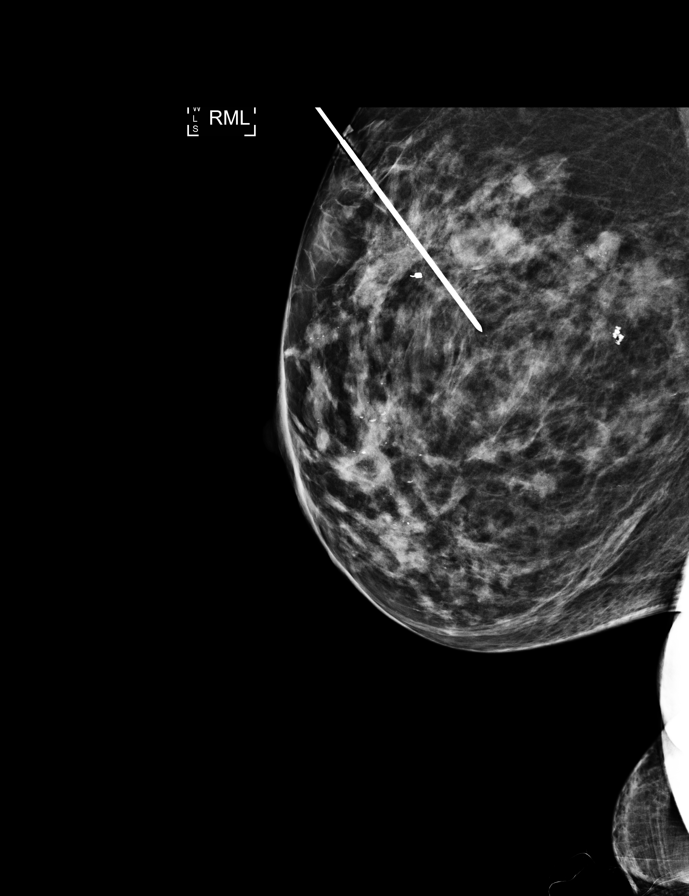

[R CC (1 of 3)]
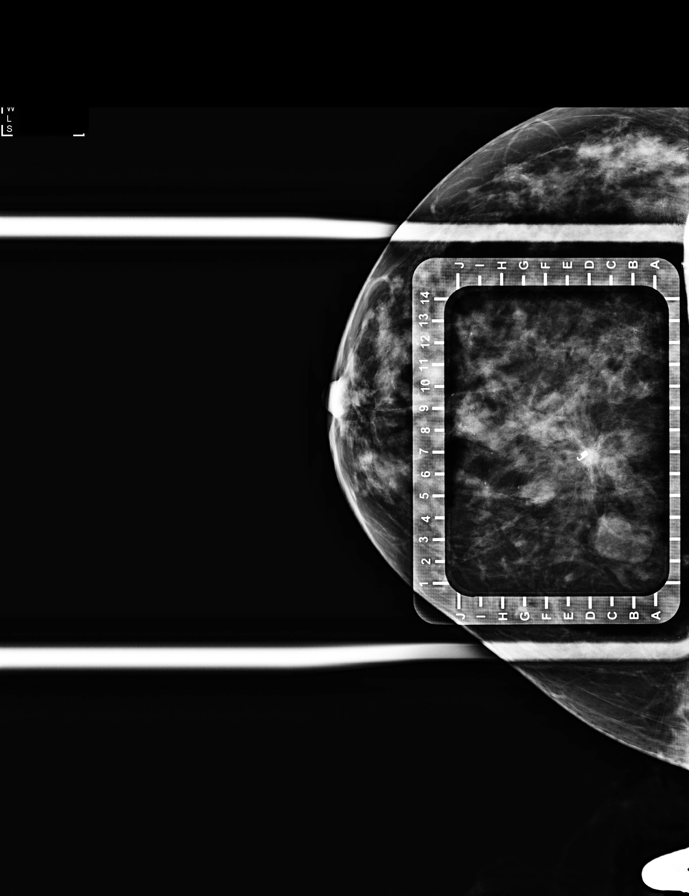

[R ML (4 of 4)]
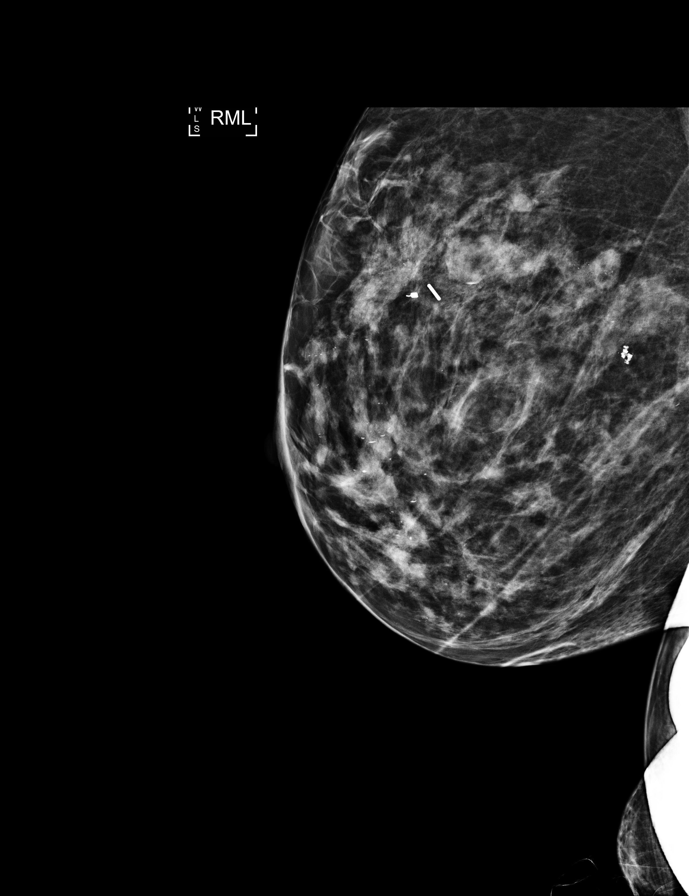

[R CC (2 of 3)]
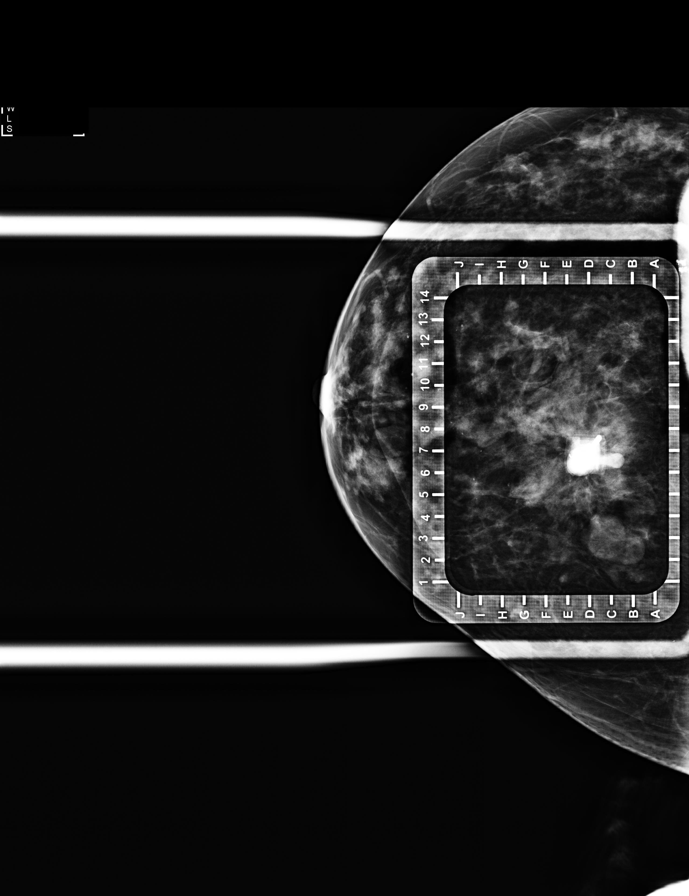

[R CC (3 of 3)]
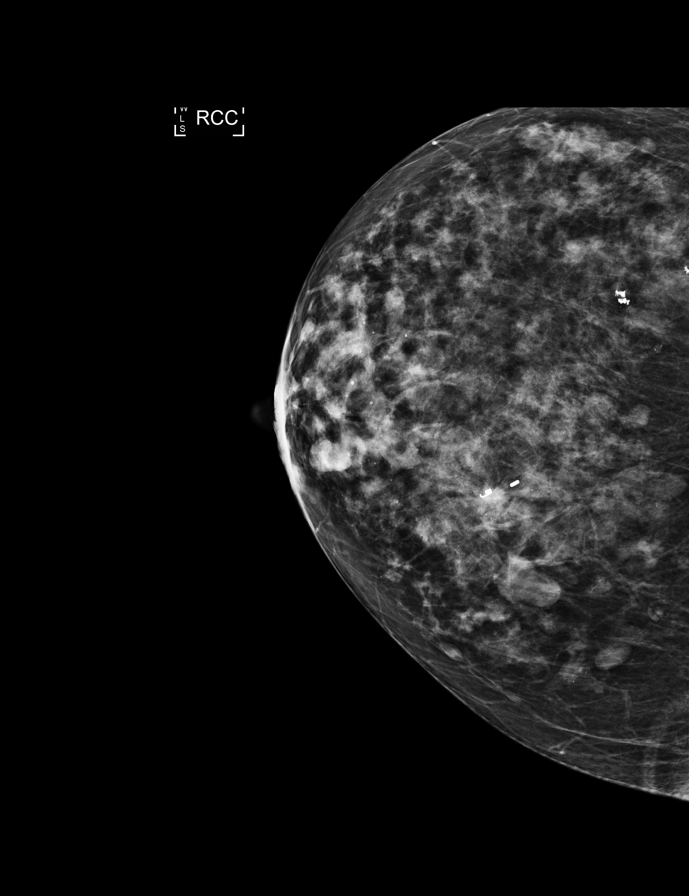

[7 of 7 positions shown; findings below may reference images not displayed]

FINDINGS: Patient presents for radioactive seed localization prior to right
breast excisional biopsy. I met with the patient and we discussed
the procedure of seed localization including benefits and
alternatives. We discussed the high likelihood of a successful
procedure. We discussed the risks of the procedure including
infection, bleeding, tissue injury and further surgery. We discussed
the low dose of radioactivity involved in the procedure. Informed,
written consent was given.

The usual time-out protocol was performed immediately prior to the
procedure.

Using mammographic guidance, sterile technique, 1% lidocaine and an
H-XIK radioactive seed, the coil shaped biopsy marking clip in the
upper inner right breast was localized using a superior approach.
The follow-up mammogram images confirm the seed in the expected
location and were marked for Dr. Staab.

Follow-up survey of the patient confirms presence of the radioactive
seed.

Order number of H-XIK seed:  [id number].

Total activity:  0.246 millicuries reference Date: 05/18/2019

The patient tolerated the procedure well and was released from the
[REDACTED]. She was given instructions regarding seed removal.
IMPRESSION: Radioactive seed localization right breast. No apparent
complications.

## 2021-06-28 NOTE — Telephone Encounter (Signed)
I s/w the pt today. I informed her that she will need an appt for pre op clearance. Pt is agreeable to plan of care. I have scheduled the pt to see Oda Kilts, [facility] 07/06/21 @ 8 am. Pt is grateful for the call and the help.

## 2021-06-30 ENCOUNTER — Encounter: Payer: Self-pay | Admitting: Cardiology

## 2021-06-30 DIAGNOSIS — M19012 Primary osteoarthritis, left shoulder: Secondary | ICD-10-CM | POA: Diagnosis not present

## 2021-06-30 NOTE — Progress Notes (Signed)
PERIOPERATIVE PRESCRIPTION FOR IMPLANTED CARDIAC DEVICE PROGRAMMING  Patient Information: Name:  Karen Gomez  DOB:  12/17/1945  MRN:  [id number]    Johny Drilling, RN  P Cv Div Heartcare Device Planned Procedure:  Left reverse shoulder arthroplasty  Surgeon:  Dr. Onnie Graham  Date of Procedure:  07/20/21  Cautery will be used.  Position during surgery:     Please send documentation back to:  [facility] (Fax # [phone])   Johny Drilling, RN  06/30/2021 10:00 AM  Device Information:  Clinic EP Physician:  Allegra Lai, MD   Device Type:  Pacemaker Manufacturer and Phone #:  Medtronic: [phone] Pacemaker Dependent?:  No. Date of Last Device Check:  06/14/21 (remote) 10/27/20 (in-clinic)  Normal Device Function?:  Yes.    Electrophysiologist's Recommendations:  Have magnet available. Provide continuous ECG monitoring when magnet is used or reprogramming is to be performed.  Procedure may interfere with device function.  Magnet should be placed over device during procedure. If magnet placement cannot safely remain over device throughout procedure due to location of surgery/ please have industry present for reprogramming.    Per Device Clinic [address], [place], RN  10:21 AM 06/30/2021

## 2021-06-30 NOTE — Patient Instructions (Addendum)
DUE TO COVID-19 ONLY ONE VISITOR IS ALLOWED TO COME WITH YOU AND STAY IN THE WAITING ROOM ONLY DURING PRE OP AND PROCEDURE DAY OF SURGERY IF YOU ARE GOING HOME AFTER SURGERY. IF YOU ARE SPENDING THE NIGHT 2 PEOPLE MAY VISIT WITH YOU IN YOUR PRIVATE ROOM AFTER SURGERY UNTIL VISITING  HOURS ARE OVER AT 800 PM AND 1  VISITOR  MAY  SPEND THE NIGHT.                 Karen Gomez    Your procedure is scheduled on: 07/20/21   Report to [facility] Main  Entrance   Report to short stay at 5:15 AM     Call this number if you have problems the morning of surgery [phone]   No food after midnight.    You may have clear liquid until 4:30 AM.    At 4:15 AM drink pre surgery drink.   Nothing by mouth after 4:30 AM.    CLEAR LIQUID DIET   Foods Allowed                                                                     Foods Excluded  Coffee and tea, regular and decaf                             liquids that you cannot  Plain Jell-O any favor except red or purple                                           see through such as: Fruit ices (not with fruit pulp)                                     milk, soups, orange juice  Iced Popsicles                                    All solid food Carbonated beverages, regular and diet                                    Cranberry, grape and apple juices Sports drinks like Gatorade Lightly seasoned clear broth or consume(fat free) Sugar     BRUSH YOUR TEETH MORNING OF SURGERY AND RINSE YOUR MOUTH OUT, NO CHEWING GUM CANDY OR MINTS.     Take these medicines the morning of surgery with A SIP OF WATER: Gabapentin, Lexapro                                You may not have any metal on your body including hair pins and              piercings  Do not wear jewelry, make-up, lotions, powders or perfumes, deodorant  Do not wear nail polish on your fingernails.  Do not shave  48 hours prior to surgery.                Do not bring  valuables to the hospital. [place].  Contacts, dentures or bridgework may not be worn into surgery.     Patients discharged the day of surgery will not be allowed to drive home.  IF YOU ARE HAVING SURGERY AND GOING HOME THE SAME DAY, YOU MUST HAVE AN ADULT TO DRIVE YOU HOME AND BE WITH YOU FOR 24 HOURS. YOU MAY GO HOME BY TAXI OR UBER OR ORTHERWISE, BUT AN ADULT MUST ACCOMPANY YOU HOME AND STAY WITH YOU FOR 24 HOURS.  Name and phone number of your driver:  Special Instructions: N/A              Please read over the following fact sheets you were given: _____________________________________________________________________  [facility]- Preparing for Total Shoulder Arthroplasty    Before surgery, you can play an important role. Because skin is not sterile, your skin needs to be as free of germs as possible. You can reduce the number of germs on your skin by using the following products. Benzoyl Peroxide Gel Reduces the number of germs present on the skin Applied twice a day to shoulder area starting two days before surgery    ==================================================================  Please follow these instructions carefully:  BENZOYL PEROXIDE 5% GEL  Please do not use if you have an allergy to benzoyl peroxide.   If your skin becomes reddened/irritated stop using the benzoyl peroxide.  Starting two days before surgery, apply as follows: Apply benzoyl peroxide in the morning and at night. Apply after taking a shower. If you are not taking a shower clean entire shoulder front, back, and side along with the armpit with a clean wet washcloth.  Place a quarter-sized dollop on your shoulder and rub in thoroughly, making sure to cover the front, back, and side of your shoulder, along with the armpit.   2 days before ____ AM   ____ PM              1 day before ____ AM   ____ PM                         Do this twice a day for two  days.  (Last application is the night before surgery, AFTER using the CHG soap as described below).  Do NOT apply benzoyl peroxide gel on the day of surgery.            [facility] - Preparing for Surgery Before surgery, you can play an important role.  Because skin is not sterile, your skin needs to be as free of germs as possible.  You can reduce the number of germs on your skin by washing with CHG (chlorahexidine gluconate) soap before surgery.  CHG is an antiseptic cleaner which kills germs and bonds with the skin to continue killing germs even after washing. Please DO NOT use if you have an allergy to CHG or antibacterial soaps.  If your skin becomes reddened/irritated stop using the CHG and inform your nurse when you arrive at Short Stay. Do not shave (including legs and underarms) for at least 48 hours prior to the first CHG shower.   Please follow these  instructions carefully:  1.  Shower with CHG Soap the night before surgery and the  morning of Surgery.  2.  If you choose to wash your hair, wash your hair first as usual with your  normal  shampoo.  3.  After you shampoo, rinse your hair and body thoroughly to remove the  shampoo.                            4.  Use CHG as you would any other liquid soap.  You can apply chg directly  to the skin and wash                       Gently with a scrungie or clean washcloth.  5.  Apply the CHG Soap to your body ONLY FROM THE NECK DOWN.   Do not use on face/ open                           Wound or open sores. Avoid contact with eyes, ears mouth and genitals (private parts).                       Wash face,  Genitals (private parts) with your normal soap.             6.  Wash thoroughly, paying special attention to the area where your surgery  will be performed.  7.  Thoroughly rinse your body with warm water from the neck down.  8.  DO NOT shower/wash with your normal soap after using and rinsing off  the CHG Soap.                9.  Pat yourself  dry with a clean towel.            10.  Wear clean pajamas.            11.  Place clean sheets on your bed the night of your first shower and do not  sleep with pets. Day of Surgery : Do not apply any lotions/deodorants the morning of surgery.  Please wear clean clothes to the hospital/surgery center.  FAILURE TO FOLLOW THESE INSTRUCTIONS MAY RESULT IN THE CANCELLATION OF YOUR SURGERY PATIENT SIGNATURE_________________________________  NURSE SIGNATURE__________________________________  ________________________________________________________________________   Karen Gomez  An incentive spirometer is a tool that can help keep your lungs clear and active. This tool measures how well you are filling your lungs with each breath. Taking long deep breaths may help reverse or decrease the chance of developing breathing (pulmonary) problems (especially infection) following: A long period of time when you are unable to move or be active. BEFORE THE PROCEDURE  If the spirometer includes an indicator to show your best effort, your nurse or respiratory therapist will set it to a desired goal. If possible, sit up straight or lean slightly forward. Try not to slouch. Hold the incentive spirometer in an upright position. INSTRUCTIONS FOR USE  Sit on the edge of your bed if possible, or sit up as far as you can in bed or on a chair. Hold the incentive spirometer in an upright position. Breathe out normally. Place the mouthpiece in your mouth and seal your lips tightly around it. Breathe in slowly and as deeply as possible, raising the piston or the ball toward the top of the column. Hold your breath for 3-5  seconds or for as long as possible. Allow the piston or ball to fall to the bottom of the column. Remove the mouthpiece from your mouth and breathe out normally. Rest for a few seconds and repeat Steps 1 through 7 at least 10 times every 1-2 hours when you are awake. Take your time and take a  few normal breaths between deep breaths. The spirometer may include an indicator to show your best effort. Use the indicator as a goal to work toward during each repetition. After each set of 10 deep breaths, practice coughing to be sure your lungs are clear. If you have an incision (the cut made at the time of surgery), support your incision when coughing by placing a pillow or rolled up towels firmly against it. Once you are able to get out of bed, walk around indoors and cough well. You may stop using the incentive spirometer when instructed by your caregiver.  RISKS AND COMPLICATIONS Take your time so you do not get dizzy or light-headed. If you are in pain, you may need to take or ask for pain medication before doing incentive spirometry. It is harder to take a deep breath if you are having pain. AFTER USE Rest and breathe slowly and easily. It can be helpful to keep track of a log of your progress. Your caregiver can provide you with a simple table to help with this. If you are using the spirometer at home, follow these instructions: [place] IF:  You are having difficultly using the spirometer. You have trouble using the spirometer as often as instructed. Your pain medication is not giving enough relief while using the spirometer. You develop fever of 100.5 F (38.1 C) or higher. SEEK IMMEDIATE MEDICAL CARE IF:  You cough up bloody sputum that had not been present before. You develop fever of 102 F (38.9 C) or greater. You develop worsening pain at or near the incision site. MAKE SURE YOU:  Understand these instructions. Will watch your condition. Will get help right away if you are not doing well or get worse. Document Released: 10/15/2006 Document Revised: 08/27/2011 Document Reviewed: 12/16/2006 [facility] Patient Information 2014 [place], [place].   ________________________________________________________________________

## 2021-07-05 NOTE — Progress Notes (Signed)
Electrophysiology Office Note Date: 07/06/2021  ID:  Karen Gomez 12/31/1945, MRN [id number]  PCP: Karen Stains, MD Primary Cardiologist: Karen Meredith Leeds, MD Electrophysiologist: Karen Haw, MD   CC: Pacemaker follow-up  Karen Gomez is a 76 y.o. female seen today for Karen Meredith Leeds, MD for cardiac clearance.  Since last being seen in our clinic the patient reports doing very well.  she denies chest pain, palpitations, dyspnea, PND, orthopnea, nausea, vomiting, dizziness, syncope, edema, weight gain, or early satiety.  Device History: Medtronic Dual Chamber PPM implanted 2019 for CHB/syncope  Past Medical History:  Diagnosis Date   Anxiety    " husband passed unexpectedly in 2013" (11/29/2017)   Arthritis    "right knee, hands, cervical & lumbar" (11/29/2017)   DDD (degenerative disc disease), cervical    DDD (degenerative disc disease), lumbar    Degenerative scoliosis in adult patient    GERD (gastroesophageal reflux disease)    History of loop recorder- removed 11/29/17 11/30/2017   Hypertension    Nerve pain    CYST IN LOWER LUMBAR SPINE   Pneumonia 1990s X 1   PONV (postoperative nausea and vomiting)    WHEN SHE HAD NECK SURGERY ?2002, HAD SOME N/V, NONE WITH HER OTHER SURGERIES   Presence of permanent cardiac pacemaker 11/29/2017   RBBB    S/P placement of cardiac pacemaker 11/29/17 MDT 11/30/2017   Past Surgical History:  Procedure Laterality Date   ANTERIOR CERVICAL DECOMP/DISCECTOMY FUSION  ~ 2002   PLACEMENT OF TITANIUM PLATE   BACK SURGERY     CATARACT EXTRACTION W/ INTRAOCULAR LENS  IMPLANT, BILATERAL     CESAREAN SECTION  1973; [place] / REPLACE / REMOVE PACEMAKER  11/29/2017   LEFT HEART CATH AND CORONARY ANGIOGRAPHY N/A 03/27/2018   Procedure: LEFT HEART CATH AND CORONARY ANGIOGRAPHY;  Surgeon: Troy Sine, MD;  Location: [facility];  Service:  Cardiovascular;  Laterality: N/A;   LOOP RECORDER INSERTION N/A 07/09/2017   Procedure: LOOP RECORDER INSERTION;  Surgeon: Karen Haw, MD;  Location: [place] [facility];  Service: Cardiovascular;  Laterality: N/A;   LOOP RECORDER REMOVAL  11/29/2017   LOOP RECORDER REMOVAL N/A 11/29/2017   Procedure: LOOP RECORDER REMOVAL;  Surgeon: Karen Haw, MD;  Location: [place] [facility];  Service: Cardiovascular;  Laterality: N/A;   PACEMAKER IMPLANT N/A 11/29/2017   Procedure: PACEMAKER IMPLANT;  Surgeon: Karen Haw, MD;  Location: [place] [facility];  Service: Cardiovascular;  Laterality: N/A;   RADIOACTIVE SEED GUIDED EXCISIONAL BREAST BIOPSY Right 05/28/2019   Procedure: RADIOACTIVE SEED GUIDED EXCISIONAL RIGHT  BREAST BIOPSY;  Surgeon: Rolm Bookbinder, MD;  Location: [facility];  Service: General;  Laterality: Right;   REVERSE SHOULDER ARTHROPLASTY Right 06/28/2016   Procedure: REVERSE SHOULDER ARTHROPLASTY;  Surgeon: Justice Britain, MD;  Location: [place];  Service: Orthopedics;  Laterality: Right;   TONSILLECTOMY  1940s   UTERINE POLYPS      Current Outpatient Medications  Medication Sig Dispense Refill   alendronate (FOSAMAX) 70 MG tablet Take 70 mg by mouth every Saturday.     Ascorbic Acid (VITAMIN C) 1000 MG tablet Take 1,000 mg by mouth daily.     calcium-vitamin D (OSCAL WITH D) 500-200 MG-UNIT tablet Take 1 tablet by mouth daily.     cholecalciferol (VITAMIN D) 1000 units tablet  Take 1,000 Units by mouth daily.     clindamycin (CLEOCIN) 150 MG capsule Take 150 mg by mouth 3 (three) times daily.     escitalopram (LEXAPRO) 10 MG tablet Take 10 mg by mouth daily.     gabapentin (NEURONTIN) 300 MG capsule Take 300 mg by mouth at bedtime.      HYDROcodone-acetaminophen (NORCO) 10-325 MG tablet Take 0.5 tablets by mouth daily as needed for moderate pain.     lisinopril (ZESTRIL) 5 MG tablet Take 5 mg by mouth daily.     Magnesium 250 MG TABS Take 250  mg by mouth daily.     Multiple Vitamin (MULTIVITAMIN WITH MINERALS) TABS tablet Take 1 tablet by mouth daily. Centrum Silver     Probiotic Product (ALIGN) 4 MG CAPS Take 4 mg by mouth daily.     Propylene Glycol (SYSTANE BALANCE) 0.6 % SOLN Apply 1 drop to eye daily as needed (dry eyes).     No current facility-administered medications for this visit.    Allergies:   Chocolate and Penicillins   Social History: Social History   Socioeconomic History   Marital status: Widowed    Spouse name: Not on file   Number of children: 2   Years of education: Not on file   Highest education level: Not on file  Occupational History   Not on file  Tobacco Use   Smoking status: Never   Smokeless tobacco: Never  Vaping Use   Vaping Use: Never used  Substance and Sexual Activity   Alcohol use: Yes    Alcohol/week: 14.0 standard drinks    Types: 14 Glasses of wine per week   Drug use: Never   Sexual activity: Not Currently  Other Topics Concern   Not on file  Social History Narrative   Not on file   Social Determinants of Health   Financial Resource Strain: Not on file  Food Insecurity: Not on file  Transportation Needs: Not on file  Physical Activity: Not on file  Stress: Not on file  Social Connections: Not on file  Intimate Partner Violence: Not on file    Family History: Family History  Problem Relation Age of Onset   Dementia Mother    Heart disease Father    Heart disease Brother      Review of Systems: All other systems reviewed and are otherwise negative except as noted above.  Physical Exam: Vitals:   07/06/21 0823  BP: 120/60  Pulse: (!) 52  SpO2: 99%  Weight: 112 lb (50.8 kg)  Height: 5' 2.5" (1.588 m)     GEN- The patient is well appearing, alert and oriented x 3 today.   HEENT: normocephalic, atraumatic; sclera clear, conjunctiva pink; hearing intact; oropharynx clear; neck supple  Lungs- Clear to ausculation bilaterally, normal work of breathing.   No wheezes, rales, rhonchi Heart- Regular rate and rhythm, no murmurs, rubs or gallops  GI- soft, non-tender, non-distended, bowel sounds present  Extremities- no clubbing or cyanosis. No edema MS- no significant deformity or atrophy Skin- warm and dry, no rash or lesion; PPM pocket well healed Psych- euthymic mood, full affect Neuro- strength and sensation are intact  PPM Interrogation- reviewed in detail today,  See PACEART report  EKG:  EKG is ordered today. Personal review of ekg ordered today shows sinus bradycardia in the 50s   Recent Labs: 07/22/2020: Hemoglobin 13.7; Platelet Count 216   Wt Readings from Last 3 Encounters:  07/06/21 112 lb (50.8 kg)  10/27/20  113 lb (51.3 kg)  07/22/20 111 lb 1.6 oz (50.4 kg)     Other studies Reviewed: Additional studies/ records that were reviewed today include: Previous EP office notes, Previous remote checks, Most recent labwork.   Assessment and Plan:  1. CHB / syncope s/p Medtronic PPM  Normal PPM function See Pace Art report No changes today  2.  Right bundle branch block, left anterior fascicular block:  Stable.    3.  Takotsubo cardiomyopathy:  Echo 06/2018 LVEF 55-60%.  No symptoms to warrant update at this time.   4. Cardiac clearance for left shoulder arthroplasy OK to proceed without further work up  Device clearance and instructions previously given. See note 06/30/2021  Current medicines are reviewed at length with the patient today.     Disposition:   Follow up with Dr. Curt Bears in 12 months   Signed, Shirley Friar, PA-C  07/06/2021 8:43 AM  [facility] [address] [place] [address] [phone] (office) [phone] (fax)

## 2021-07-06 ENCOUNTER — Ambulatory Visit (INDEPENDENT_AMBULATORY_CARE_PROVIDER_SITE_OTHER): Payer: Medicare Other | Admitting: Student

## 2021-07-06 ENCOUNTER — Other Ambulatory Visit: Payer: Self-pay

## 2021-07-06 ENCOUNTER — Encounter: Payer: Self-pay | Admitting: Student

## 2021-07-06 VITALS — BP 120/60 | HR 52 | Ht 62.5 in | Wt 112.0 lb

## 2021-07-06 DIAGNOSIS — R55 Syncope and collapse: Secondary | ICD-10-CM

## 2021-07-06 DIAGNOSIS — Z01810 Encounter for preprocedural cardiovascular examination: Secondary | ICD-10-CM | POA: Diagnosis not present

## 2021-07-06 DIAGNOSIS — I442 Atrioventricular block, complete: Secondary | ICD-10-CM

## 2021-07-06 DIAGNOSIS — Z01818 Encounter for other preprocedural examination: Secondary | ICD-10-CM

## 2021-07-06 LAB — CUP PACEART INCLINIC DEVICE CHECK
Battery Remaining Longevity: 123 mo
Battery Voltage: 3.02 V
Brady Statistic AP VP Percent: 0.01 %
Brady Statistic AP VS Percent: 6.18 %
Brady Statistic AS VP Percent: 0.08 %
Brady Statistic AS VS Percent: 93.74 %
Brady Statistic RA Percent Paced: 6.17 %
Brady Statistic RV Percent Paced: 0.08 %
Date Time Interrogation Session: 20230119084259
Implantable Lead Implant Date: 20190614
Implantable Lead Implant Date: 20190614
Implantable Lead Location: 753859
Implantable Lead Location: 753860
Implantable Lead Model: 5076
Implantable Lead Model: 5076
Implantable Pulse Generator Implant Date: 20190614
Lead Channel Impedance Value: 323 Ohm
Lead Channel Impedance Value: 342 Ohm
Lead Channel Impedance Value: 418 Ohm
Lead Channel Impedance Value: 513 Ohm
Lead Channel Pacing Threshold Amplitude: 0.625 V
Lead Channel Pacing Threshold Amplitude: 1.125 V
Lead Channel Pacing Threshold Pulse Width: 0.4 ms
Lead Channel Pacing Threshold Pulse Width: 0.4 ms
Lead Channel Sensing Intrinsic Amplitude: 13.875 mV
Lead Channel Sensing Intrinsic Amplitude: 20.75 mV
Lead Channel Sensing Intrinsic Amplitude: 3.375 mV
Lead Channel Sensing Intrinsic Amplitude: 7 mV
Lead Channel Setting Pacing Amplitude: 2.5 V
Lead Channel Setting Pacing Amplitude: 2.75 V
Lead Channel Setting Pacing Pulse Width: 0.4 ms
Lead Channel Setting Sensing Sensitivity: 2.8 mV

## 2021-07-07 DIAGNOSIS — D7589 Other specified diseases of blood and blood-forming organs: Secondary | ICD-10-CM | POA: Diagnosis not present

## 2021-07-07 DIAGNOSIS — I10 Essential (primary) hypertension: Secondary | ICD-10-CM | POA: Diagnosis not present

## 2021-07-07 DIAGNOSIS — G894 Chronic pain syndrome: Secondary | ICD-10-CM | POA: Diagnosis not present

## 2021-07-07 DIAGNOSIS — Z950 Presence of cardiac pacemaker: Secondary | ICD-10-CM | POA: Diagnosis not present

## 2021-07-10 ENCOUNTER — Encounter (HOSPITAL_COMMUNITY)
Admission: RE | Admit: 2021-07-10 | Discharge: 2021-07-10 | Disposition: A | Discharge status: Home / Self Care | Payer: BC Managed Care – PPO | Source: Ambulatory Visit | Attending: Anesthesiology | Admitting: Anesthesiology

## 2021-07-10 DIAGNOSIS — I10 Essential (primary) hypertension: Secondary | ICD-10-CM

## 2021-07-10 DIAGNOSIS — Z01818 Encounter for other preprocedural examination: Secondary | ICD-10-CM

## 2021-07-11 NOTE — Progress Notes (Signed)
Your procedure is scheduled on:  07/20/21   Report to [facility] Main  Entrance   Report to admitting at  [place] AM     Call this number if you have problems the morning of surgery [phone]    REMEMBER: NO  SOLID FOOD CANDY OR GUM AFTER MIDNIGHT. CLEAR LIQUIDS UNTIL   0430am       . NOTHING BY MOUTH EXCEPT CLEAR LIQUIDS UNTIL    0430am    . PLEASE FINISH ENSURE DRINK PER SURGEON ORDER  WHICH NEEDS TO BE COMPLETED AT      .  0430am     CLEAR LIQUID DIET   Foods Allowed                                                                    Coffee and tea, regular and decaf                            Fruit ices (not with fruit pulp)                                      Iced Popsicles                                    Carbonated beverages, regular and diet                                    Cranberry, grape and apple juices Sports drinks like Gatorade Lightly seasoned clear broth or consume(fat free) Sugar, honey syrup ___________________________________________________________________      BRUSH YOUR TEETH MORNING OF SURGERY AND RINSE YOUR MOUTH OUT, NO CHEWING GUM CANDY OR MINTS.     Take these medicines the morning of surgery with A SIP OF WATER:   lexapro, magnesium   DO NOT TAKE ANY DIABETIC MEDICATIONS DAY OF YOUR SURGERY                               You may not have any metal on your body including hair pins and              piercings  Do not wear jewelry, make-up, lotions, powders or perfumes, deodorant             Do not wear nail polish on your fingernails.  Do not shave  48 hours prior to surgery.              Men may shave face and neck.   Do not bring valuables to the hospital. [place].  Contacts, dentures or bridgework may not be worn into surgery.  Leave suitcase in the car. After surgery it may be brought to your room.     Patients discharged the day of surgery will not be allowed to  drive  home. IF YOU ARE HAVING SURGERY AND GOING HOME THE SAME DAY, YOU MUST HAVE AN ADULT TO DRIVE YOU HOME AND BE WITH YOU FOR 24 HOURS. YOU MAY GO HOME BY TAXI OR UBER OR ORTHERWISE, BUT AN ADULT MUST ACCOMPANY YOU HOME AND STAY WITH YOU FOR 24 HOURS.  Name and phone number of your driver:  Special Instructions: N/A              Please read over the following fact sheets you were given: _____________________________________________________________________  [facility] - Preparing for Surgery Before surgery, you can play an important role.  Because skin is not sterile, your skin needs to be as free of germs as possible.  You can reduce the number of germs on your skin by washing with CHG (chlorahexidine gluconate) soap before surgery.  CHG is an antiseptic cleaner which kills germs and bonds with the skin to continue killing germs even after washing. Please DO NOT use if you have an allergy to CHG or antibacterial soaps.  If your skin becomes reddened/irritated stop using the CHG and inform your nurse when you arrive at Short Stay. Do not shave (including legs and underarms) for at least 48 hours prior to the first CHG shower.  You may shave your face/neck. Please follow these instructions carefully:  1.  Shower with CHG Soap the night before surgery and the  morning of Surgery.  2.  If you choose to wash your hair, wash your hair first as usual with your  normal  shampoo.  3.  After you shampoo, rinse your hair and body thoroughly to remove the  shampoo.                           4.  Use CHG as you would any other liquid soap.  You can apply chg directly  to the skin and wash                       Gently with a scrungie or clean washcloth.  5.  Apply the CHG Soap to your body ONLY FROM THE NECK DOWN.   Do not use on face/ open                           Wound or open sores. Avoid contact with eyes, ears mouth and genitals (private parts).                       Wash face,  Genitals (private parts) with  your normal soap.             6.  Wash thoroughly, paying special attention to the area where your surgery  will be performed.  7.  Thoroughly rinse your body with warm water from the neck down.  8.  DO NOT shower/wash with your normal soap after using and rinsing off  the CHG Soap.                9.  Pat yourself dry with a clean towel.            10.  Wear clean pajamas.            11.  Place clean sheets on your bed the night of your first shower and do not  sleep with pets. Day of Surgery : Do not apply any lotions/deodorants the  morning of surgery.  Please wear clean clothes to the hospital/surgery center.  FAILURE TO FOLLOW THESE INSTRUCTIONS MAY RESULT IN THE CANCELLATION OF YOUR SURGERY PATIENT SIGNATURE_________________________________  NURSE SIGNATURE__________________________________  ________________________________________________________________________

## 2021-07-11 NOTE — Progress Notes (Addendum)
Anesthesia Review:  PCP: Dr Harlan Stains  Cardiologist : DR  Will Camnitz  LOV with Barrington Ellison, PA 07/06/21  LOV with DR Curt Bears- 10/27/20  Chest x-ray : EKG :07/06/21  Device check- 07/06/21  Orders on front of chart Placed call to [employer] awaiting call back from [employer] to inform them of orders.  Have not heart back from [employer] as of 07/17/21 so placed another call into [employer].   To speak with rep regarding orders on chart  [place], [employer] Device rep called back and stated [employer] would be here for surgery.   Echo : Stress test: Cardiac Cath :  Activity level: can do a flight of stairs without difficulty  Sleep Study/ CPAP : none  Fasting Blood Sugar :      / Checks Blood Sugar -- times a day:   Blood Thinner/ Instructions /Last Dose: ASA / Instructions/ Last Dose :   No covid test- ambulatoroy surgery

## 2021-07-11 NOTE — Progress Notes (Signed)
[facility]- Preparing for Total Shoulder Arthroplasty  °  °Before surgery, you can play an important role. Because skin is not sterile, your skin needs to be as free of germs as possible. You can reduce the number of germs on your skin by using the following products. °Benzoyl Peroxide Gel °Reduces the number of germs present on the skin °Applied twice a day to shoulder area starting two days before surgery   ° °================================================================== ° °Please follow these instructions carefully: ° °BENZOYL PEROXIDE 5% GEL ° °Please do not use if you have an allergy to benzoyl peroxide.   If your skin becomes reddened/irritated stop using the benzoyl peroxide. ° °Starting two days before surgery, apply as follows: °Apply benzoyl peroxide in the morning and at night. Apply after taking a shower. If you are not taking a shower clean entire shoulder front, back, and side along with the armpit with a clean wet washcloth. ° °Place a quarter-sized dollop on your shoulder and rub in thoroughly, making sure to cover the front, back, and side of your shoulder, along with the armpit.  ° °2 days before ____ AM   ____ PM              1 day before ____ AM   ____ PM °                        °Do this twice a day for two days.  (Last application is the night before surgery, AFTER using the CHG soap as described below). ° °Do NOT apply benzoyl peroxide gel on the day of surgery.  °

## 2021-07-13 ENCOUNTER — Encounter (HOSPITAL_COMMUNITY): Payer: Self-pay

## 2021-07-13 ENCOUNTER — Other Ambulatory Visit: Payer: Self-pay

## 2021-07-13 ENCOUNTER — Encounter (HOSPITAL_COMMUNITY)
Admission: RE | Admit: 2021-07-13 | Discharge: 2021-07-13 | Disposition: A | Discharge status: Home / Self Care | Payer: Medicare Other | Source: Ambulatory Visit | Attending: Orthopedic Surgery | Admitting: Orthopedic Surgery

## 2021-07-13 VITALS — BP 127/55 | HR 61 | Temp 98.0°F | Resp 18 | Ht 62.5 in

## 2021-07-13 DIAGNOSIS — Z01818 Encounter for other preprocedural examination: Secondary | ICD-10-CM

## 2021-07-13 DIAGNOSIS — Z950 Presence of cardiac pacemaker: Secondary | ICD-10-CM | POA: Diagnosis not present

## 2021-07-13 DIAGNOSIS — M75102 Unspecified rotator cuff tear or rupture of left shoulder, not specified as traumatic: Secondary | ICD-10-CM | POA: Diagnosis not present

## 2021-07-13 DIAGNOSIS — I10 Essential (primary) hypertension: Secondary | ICD-10-CM | POA: Diagnosis not present

## 2021-07-13 DIAGNOSIS — I4510 Unspecified right bundle-branch block: Secondary | ICD-10-CM | POA: Insufficient documentation

## 2021-07-13 DIAGNOSIS — Z01812 Encounter for preprocedural laboratory examination: Secondary | ICD-10-CM | POA: Insufficient documentation

## 2021-07-13 HISTORY — DX: Depression, unspecified: F32.A

## 2021-07-13 LAB — CBC
HCT: 42 % (ref 36.0–46.0)
Hemoglobin: 13.8 g/dL (ref 12.0–15.0)
MCH: 34.3 pg — ABNORMAL HIGH (ref 26.0–34.0)
MCHC: 32.9 g/dL (ref 30.0–36.0)
MCV: 104.5 fL — ABNORMAL HIGH (ref 80.0–100.0)
Platelets: 231 10*3/uL (ref 150–400)
RBC: 4.02 MIL/uL (ref 3.87–5.11)
RDW: 12.3 % (ref 11.5–15.5)
WBC: 4.2 10*3/uL (ref 4.0–10.5)
nRBC: 0 % (ref 0.0–0.2)

## 2021-07-13 LAB — BASIC METABOLIC PANEL
Anion gap: 6 (ref 5–15)
BUN: 17 mg/dL (ref 8–23)
CO2: 29 mmol/L (ref 22–32)
Calcium: 9.4 mg/dL (ref 8.9–10.3)
Chloride: 100 mmol/L (ref 98–111)
Creatinine, Ser: 0.68 mg/dL (ref 0.44–1.00)
GFR, Estimated: >60 mL/min (ref >60)
Glucose, Bld: 88 mg/dL (ref 70–99)
Potassium: 4.5 mmol/L (ref 3.5–5.1)
Sodium: 135 mmol/L (ref 135–145)

## 2021-07-13 LAB — SURGICAL PCR SCREEN
MRSA, PCR: NEGATIVE
Staphylococcus aureus: NEGATIVE

## 2021-07-13 NOTE — Progress Notes (Signed)
Anesthesia Chart Review   Case: [id number] Date/Time: 07/20/21 0715   Procedure: REVERSE SHOULDER ARTHROPLASTY (Left: Shoulder) - 116min   Anesthesia type: General   Pre-op diagnosis: Left shoulder rotator cuff tear arthropathy   Location: [facility]OM 06 / [facility]   Surgeons: Justice Britain, MD       DISCUSSION:76 y.o. never smoker with h/o PONV, HTN, RBBB, pacemaker in place, left shoulder rotator cuff tear scheduled for above procedure 07/20/21 with Dr. Justice Britain.   Pt last seen by cardiology 07/06/2021. Pt stable at this visit. Per OV note, "OK to proceed without further work up"  Anticipate pt can proceed with planned procedure barring acute status change.   VS: BP (!) 127/55    Pulse 61    Temp 36.7 C (Oral)    Resp 18    Ht 5' 2.5" (1.588 m)    SpO2 99%    BMI 20.16 kg/m   PROVIDERS: Harlan Stains, MD is PCP   Allegra Lai, MD is Cardiologist  LABS: Labs reviewed: Acceptable for surgery. (all labs ordered are listed, but only abnormal results are displayed)  Labs Reviewed  CBC - Abnormal; Notable for the following components:      Result Value   MCV 104.5 (*)    MCH 34.3 (*)    All other components within normal limits  SURGICAL PCR SCREEN  BASIC METABOLIC PANEL     IMAGES:   EKG: 07/06/21 Rate 52 bpm  Sinus bradycardia   CV: Echo 07/03/2018 - Left ventricle: The cavity size was normal. Systolic function was    normal. The estimated ejection fraction was in the range of 55%    to 60%. Wall motion was normal; there were no regional wall    motion abnormalities. Doppler parameters are consistent with    abnormal left ventricular relaxation (grade 1 diastolic    dysfunction).  - Aortic valve: There was no regurgitation.  - Mitral valve: Transvalvular velocity was within the normal range.    There was no evidence for stenosis. There was no regurgitation.  - Left atrium: The atrium was mildly dilated.  - Right ventricle: The cavity size was normal. Wall thickness  was    normal. Systolic function was normal.  - Tricuspid valve: There was no regurgitation.  - Pulmonary arteries: Systolic pressure could not be accurately    estimated.   Cardiac Cath 18/56/3149 LV end diastolic pressure is normal. There is moderate left ventricular systolic dysfunction.   Normal epicardial coronary arteries with a dominant RCA circulation.   Moderately reduced LV function with apical ballooning/akinesis of the mid distal anterolateral wall apex and mid distal inferior wall suggestive of Takotsubo cardiomyopathy.  Global ejection fraction is 35 to 40%.  LVEDP 11 mmHg.   RECOMMENDATION: Guideline directed medical therapy for reduced LV function. No indication for antiplatelet therapy at this time. Past Medical History:  Diagnosis Date   Arthritis    "right knee, hands, cervical & lumbar" (11/29/2017)   DDD (degenerative disc disease), cervical    DDD (degenerative disc disease), lumbar    Degenerative scoliosis in adult patient    Depression    History of loop recorder- removed 11/29/17 11/30/2017   Hypertension    Nerve pain    CYST IN LOWER LUMBAR SPINE   Pneumonia 1990s X 1   PONV (postoperative nausea and vomiting)    WHEN SHE HAD NECK SURGERY ?2002, HAD SOME N/V, NONE WITH HER OTHER SURGERIES   Presence of permanent cardiac pacemaker  11/29/2017   RBBB    S/P placement of cardiac pacemaker 11/29/17 MDT 11/30/2017    Past Surgical History:  Procedure Laterality Date   ANTERIOR CERVICAL DECOMP/DISCECTOMY FUSION  ~ 2002   PLACEMENT OF TITANIUM PLATE   BACK SURGERY     CATARACT EXTRACTION W/ INTRAOCULAR LENS  IMPLANT, BILATERAL     CESAREAN SECTION  1973; [place] / REPLACE / REMOVE PACEMAKER  11/29/2017   LEFT HEART CATH AND CORONARY ANGIOGRAPHY N/A 03/27/2018   Procedure: LEFT HEART CATH AND CORONARY ANGIOGRAPHY;  Surgeon: Troy Sine, MD;  Location: [facility];   Service: Cardiovascular;  Laterality: N/A;   LEFT WRIST SURGERY      LOOP RECORDER INSERTION N/A 07/09/2017   Procedure: LOOP RECORDER INSERTION;  Surgeon: Constance Haw, MD;  Location: [facility];  Service: Cardiovascular;  Laterality: N/A;   LOOP RECORDER REMOVAL  11/29/2017   LOOP RECORDER REMOVAL N/A 11/29/2017   Procedure: LOOP RECORDER REMOVAL;  Surgeon: Constance Haw, MD;  Location: [facility];  Service: Cardiovascular;  Laterality: N/A;   PACEMAKER IMPLANT N/A 11/29/2017   Procedure: PACEMAKER IMPLANT;  Surgeon: Constance Haw, MD;  Location: [facility];  Service: Cardiovascular;  Laterality: N/A;   RADIOACTIVE SEED GUIDED EXCISIONAL BREAST BIOPSY Right 05/28/2019   Procedure: RADIOACTIVE SEED GUIDED EXCISIONAL RIGHT  BREAST BIOPSY;  Surgeon: Rolm Bookbinder, MD;  Location: [facility];  Service: General;  Laterality: Right;   REVERSE SHOULDER ARTHROPLASTY Right 06/28/2016   Procedure: REVERSE SHOULDER ARTHROPLASTY;  Surgeon: Justice Britain, MD;  Location: [facility];  Service: Orthopedics;  Laterality: Right;   TONSILLECTOMY  1940s   UTERINE POLYPS      MEDICATIONS:  alendronate (FOSAMAX) 70 MG tablet   Ascorbic Acid (VITAMIN C) 1000 MG tablet   calcium-vitamin D (OSCAL WITH D) 500-200 MG-UNIT tablet   cholecalciferol (VITAMIN D) 1000 units tablet   clindamycin (CLEOCIN) 150 MG capsule   escitalopram (LEXAPRO) 10 MG tablet   gabapentin (NEURONTIN) 300 MG capsule   HYDROcodone-acetaminophen (NORCO) 10-325 MG tablet   lisinopril (ZESTRIL) 5 MG tablet   Magnesium 250 MG TABS   Multiple Vitamin (MULTIVITAMIN WITH MINERALS) TABS tablet   Probiotic Product (ALIGN) 4 MG CAPS   Propylene Glycol (SYSTANE BALANCE) 0.6 % SOLN   No current facility-administered medications for this encounter.    Konrad Felix Ward, PA-C [facility] [phone]

## 2021-07-13 NOTE — Anesthesia Preprocedure Evaluation (Addendum)
Anesthesia Evaluation  Patient identified by MRN, date of birth, ID band Patient awake    Reviewed: Allergy & Precautions, NPO status , Patient's Chart, lab work & pertinent test results  History of Anesthesia Complications (+) PONV  Airway Mallampati: II  TM Distance: <3 FB Neck ROM: Full    Dental   Pulmonary neg pulmonary ROS,    breath sounds clear to auscultation       Cardiovascular hypertension, Pt. on medications + dysrhythmias + pacemaker  Rhythm:Regular Rate:Normal     Neuro/Psych negative neurological ROS     GI/Hepatic Neg liver ROS, GERD  ,  Endo/Other  negative endocrine ROS  Renal/GU negative Renal ROS     Musculoskeletal  (+) Arthritis ,   Abdominal   Peds  Hematology negative hematology ROS (+)   Anesthesia Other Findings   Reproductive/Obstetrics                            Anesthesia Physical Anesthesia Plan  ASA: 2  Anesthesia Plan: General   Post-op Pain Management: Regional block and Tylenol PO (pre-op)   Induction:   PONV Risk Score and Plan: 4 or greater and Dexamethasone, Ondansetron and Treatment may vary due to age or medical condition  Airway Management Planned: Oral ETT  Additional Equipment: None  Intra-op Plan:   Post-operative Plan: Extubation in OR  Informed Consent: I have reviewed the patients History and Physical, chart, labs and discussed the procedure including the risks, benefits and alternatives for the proposed anesthesia with the patient or authorized representative who has indicated his/her understanding and acceptance.     Dental advisory given  Plan Discussed with:   Anesthesia Plan Comments: (See PAT note 07/13/2021, Konrad Felix Ward, PA-C)       Anesthesia Quick Evaluation

## 2021-07-20 ENCOUNTER — Ambulatory Visit (HOSPITAL_COMMUNITY): Payer: Medicare Other | Admitting: Certified Registered Nurse Anesthetist

## 2021-07-20 ENCOUNTER — Encounter (HOSPITAL_COMMUNITY): Payer: Self-pay | Admitting: Orthopedic Surgery

## 2021-07-20 ENCOUNTER — Ambulatory Visit (HOSPITAL_COMMUNITY)
Admission: RE | Admit: 2021-07-20 | Discharge: 2021-07-20 | Disposition: A | Discharge status: Home / Self Care | Payer: Medicare Other | Source: Ambulatory Visit | Attending: Orthopedic Surgery | Admitting: Orthopedic Surgery

## 2021-07-20 ENCOUNTER — Ambulatory Visit (HOSPITAL_COMMUNITY): Payer: Medicare Other | Admitting: Physician Assistant

## 2021-07-20 ENCOUNTER — Encounter (HOSPITAL_COMMUNITY)
Admission: RE | Disposition: A | Discharge status: Home / Self Care | Payer: Self-pay | Source: Ambulatory Visit | Attending: Orthopedic Surgery

## 2021-07-20 DIAGNOSIS — M19041 Primary osteoarthritis, right hand: Secondary | ICD-10-CM | POA: Insufficient documentation

## 2021-07-20 DIAGNOSIS — M75102 Unspecified rotator cuff tear or rupture of left shoulder, not specified as traumatic: Secondary | ICD-10-CM | POA: Diagnosis not present

## 2021-07-20 DIAGNOSIS — M129 Arthropathy, unspecified: Secondary | ICD-10-CM | POA: Diagnosis not present

## 2021-07-20 DIAGNOSIS — M19012 Primary osteoarthritis, left shoulder: Secondary | ICD-10-CM | POA: Diagnosis not present

## 2021-07-20 DIAGNOSIS — M19042 Primary osteoarthritis, left hand: Secondary | ICD-10-CM | POA: Insufficient documentation

## 2021-07-20 DIAGNOSIS — I10 Essential (primary) hypertension: Secondary | ICD-10-CM | POA: Insufficient documentation

## 2021-07-20 DIAGNOSIS — M1711 Unilateral primary osteoarthritis, right knee: Secondary | ICD-10-CM | POA: Insufficient documentation

## 2021-07-20 DIAGNOSIS — Z01818 Encounter for other preprocedural examination: Secondary | ICD-10-CM

## 2021-07-20 DIAGNOSIS — M5136 Other intervertebral disc degeneration, lumbar region: Secondary | ICD-10-CM | POA: Diagnosis not present

## 2021-07-20 DIAGNOSIS — M5030 Other cervical disc degeneration, unspecified cervical region: Secondary | ICD-10-CM | POA: Diagnosis not present

## 2021-07-20 DIAGNOSIS — Z950 Presence of cardiac pacemaker: Secondary | ICD-10-CM | POA: Insufficient documentation

## 2021-07-20 DIAGNOSIS — G8918 Other acute postprocedural pain: Secondary | ICD-10-CM | POA: Diagnosis not present

## 2021-07-20 DIAGNOSIS — M12812 Other specific arthropathies, not elsewhere classified, left shoulder: Secondary | ICD-10-CM | POA: Diagnosis not present

## 2021-07-20 DIAGNOSIS — Z79899 Other long term (current) drug therapy: Secondary | ICD-10-CM | POA: Diagnosis not present

## 2021-07-20 HISTORY — PX: REVERSE SHOULDER ARTHROPLASTY: SHX5054

## 2021-07-20 SURGERY — ARTHROPLASTY, SHOULDER, TOTAL, REVERSE
Anesthesia: General | Site: Shoulder | Laterality: Left

## 2021-07-20 MED ORDER — FENTANYL CITRATE (PF) 100 MCG/2ML IJ SOLN
INTRAMUSCULAR | Status: AC
  Filled 2021-07-20: qty 2

## 2021-07-20 MED ORDER — AMISULPRIDE (ANTIEMETIC) 5 MG/2ML IV SOLN: 5.0000 mg | Freq: Once | INTRAVENOUS | Status: DC | PRN

## 2021-07-20 MED ORDER — LIDOCAINE 2% (20 MG/ML) 5 ML SYRINGE
INTRAMUSCULAR | Status: DC | PRN
  Administered 2021-07-20: 20 mg via INTRAVENOUS

## 2021-07-20 MED ORDER — LACTATED RINGERS IV SOLN: INTRAVENOUS | Status: DC

## 2021-07-20 MED ORDER — ONDANSETRON HCL 4 MG/2ML IJ SOLN
INTRAMUSCULAR | Status: DC | PRN
  Administered 2021-07-20: 4 mg via INTRAVENOUS

## 2021-07-20 MED ORDER — 0.9 % SODIUM CHLORIDE (POUR BTL) OPTIME
TOPICAL | Status: DC | PRN
  Administered 2021-07-20: 1000 mL

## 2021-07-20 MED ORDER — PHENYLEPHRINE HCL (PRESSORS) 10 MG/ML IV SOLN
INTRAVENOUS | Status: AC
  Filled 2021-07-20: qty 1

## 2021-07-20 MED ORDER — CYCLOBENZAPRINE HCL 10 MG PO TABS: 10.0000 mg | ORAL_TABLET | Freq: Three times a day (TID) | ORAL | 1 refills | Status: DC | PRN

## 2021-07-20 MED ORDER — TRANEXAMIC ACID-NACL 1000-0.7 MG/100ML-% IV SOLN
1000.0000 mg | INTRAVENOUS | Status: AC
  Administered 2021-07-20: 1000 mg via INTRAVENOUS
  Filled 2021-07-20: qty 100

## 2021-07-20 MED ORDER — ONDANSETRON HCL 4 MG/2ML IJ SOLN
INTRAMUSCULAR | Status: AC
  Filled 2021-07-20: qty 2

## 2021-07-20 MED ORDER — CEFAZOLIN SODIUM-DEXTROSE 2-4 GM/100ML-% IV SOLN
2.0000 g | INTRAVENOUS | Status: AC
  Administered 2021-07-20: 2 g via INTRAVENOUS
  Filled 2021-07-20: qty 100

## 2021-07-20 MED ORDER — CHLORHEXIDINE GLUCONATE 0.12 % MT SOLN
15.0000 mL | Freq: Once | OROMUCOSAL | Status: AC
  Administered 2021-07-20: 15 mL via OROMUCOSAL

## 2021-07-20 MED ORDER — PHENYLEPHRINE HCL-NACL 20-0.9 MG/250ML-% IV SOLN
INTRAVENOUS | Status: DC | PRN
  Administered 2021-07-20: 40 ug/min via INTRAVENOUS

## 2021-07-20 MED ORDER — DEXAMETHASONE SODIUM PHOSPHATE 10 MG/ML IJ SOLN
INTRAMUSCULAR | Status: DC | PRN
  Administered 2021-07-20: 5 mg via INTRAVENOUS

## 2021-07-20 MED ORDER — ORAL CARE MOUTH RINSE: 15.0000 mL | Freq: Once | OROMUCOSAL | Status: AC

## 2021-07-20 MED ORDER — FENTANYL CITRATE (PF) 100 MCG/2ML IJ SOLN
INTRAMUSCULAR | Status: DC | PRN
  Administered 2021-07-20 (×2): 50 ug via INTRAVENOUS

## 2021-07-20 MED ORDER — FENTANYL CITRATE PF 50 MCG/ML IJ SOSY: 25.0000 ug | PREFILLED_SYRINGE | INTRAMUSCULAR | Status: DC | PRN

## 2021-07-20 MED ORDER — BUPIVACAINE LIPOSOME 1.3 % IJ SUSP
INTRAMUSCULAR | Status: DC | PRN
Start: 2021-07-20 — End: 2021-07-20
  Administered 2021-07-20: 10 mL via PERINEURAL

## 2021-07-20 MED ORDER — PROPOFOL 10 MG/ML IV BOLUS
INTRAVENOUS | Status: DC | PRN
Start: 2021-07-20 — End: 2021-07-20
  Administered 2021-07-20: 100 mg via INTRAVENOUS

## 2021-07-20 MED ORDER — DEXAMETHASONE SODIUM PHOSPHATE 10 MG/ML IJ SOLN
INTRAMUSCULAR | Status: AC
  Filled 2021-07-20: qty 1

## 2021-07-20 MED ORDER — STERILE WATER FOR IRRIGATION IR SOLN
Status: DC | PRN
  Administered 2021-07-20: 2000 mL

## 2021-07-20 MED ORDER — PROPOFOL 500 MG/50ML IV EMUL
INTRAVENOUS | Status: AC
  Filled 2021-07-20: qty 50

## 2021-07-20 MED ORDER — BUPIVACAINE-EPINEPHRINE (PF) 0.25% -1:200000 IJ SOLN
INTRAMUSCULAR | Status: DC | PRN
  Administered 2021-07-20: 15 mL via PERINEURAL

## 2021-07-20 MED ORDER — OXYCODONE-ACETAMINOPHEN 5-325 MG PO TABS
1.0000 | ORAL_TABLET | ORAL | 0 refills | Status: DC | PRN
Start: 2021-07-20 — End: 2022-08-14

## 2021-07-20 MED ORDER — SUGAMMADEX SODIUM 200 MG/2ML IV SOLN
INTRAVENOUS | Status: DC | PRN
  Administered 2021-07-20: 150 mg via INTRAVENOUS

## 2021-07-20 MED ORDER — VANCOMYCIN HCL 1000 MG IV SOLR
INTRAVENOUS | Status: AC
  Filled 2021-07-20: qty 20

## 2021-07-20 MED ORDER — ROCURONIUM BROMIDE 10 MG/ML (PF) SYRINGE
PREFILLED_SYRINGE | INTRAVENOUS | Status: DC | PRN
  Administered 2021-07-20: 20 mg via INTRAVENOUS
  Administered 2021-07-20: 30 mg via INTRAVENOUS

## 2021-07-20 MED ORDER — ROCURONIUM BROMIDE 10 MG/ML (PF) SYRINGE
PREFILLED_SYRINGE | INTRAVENOUS | Status: AC
  Filled 2021-07-20: qty 10

## 2021-07-20 MED ORDER — NAPROXEN 500 MG PO TABS: 500.0000 mg | ORAL_TABLET | Freq: Two times a day (BID) | ORAL | 1 refills | Status: DC

## 2021-07-20 MED ORDER — MIDAZOLAM HCL 2 MG/2ML IJ SOLN
INTRAMUSCULAR | Status: AC
  Filled 2021-07-20: qty 2

## 2021-07-20 MED ORDER — VANCOMYCIN HCL IN DEXTROSE 1-5 GM/200ML-% IV SOLN
1000.0000 mg | INTRAVENOUS | Status: AC
  Administered 2021-07-20: 1000 mg via INTRAVENOUS
  Filled 2021-07-20: qty 200

## 2021-07-20 MED ORDER — LIDOCAINE HCL (PF) 2 % IJ SOLN
INTRAMUSCULAR | Status: AC
  Filled 2021-07-20: qty 5

## 2021-07-20 SURGICAL SUPPLY — 75 items
ADH SKN CLS APL DERMABOND .7 (GAUZE/BANDAGES/DRESSINGS) ×1
AID PSTN UNV HD RSTRNT DISP (MISCELLANEOUS) ×1
BAG COUNTER SPONGE SURGICOUNT (BAG) ×1 IMPLANT
BAG SPEC THK2 15X12 ZIP CLS (MISCELLANEOUS) ×1
BAG SPNG CNTER NS LX DISP (BAG) ×1
BAG ZIPLOCK 12X15 (MISCELLANEOUS) ×2 IMPLANT
BLADE SAW SGTL 83.5X18.5 (BLADE) ×2 IMPLANT
BNDG COHESIVE 4X5 TAN ST LF (GAUZE/BANDAGES/DRESSINGS) ×2 IMPLANT
BSPLAT GLND +2X24 MDLR (Joint) ×1 IMPLANT
COOLER ICEMAN CLASSIC (MISCELLANEOUS) ×2 IMPLANT
COVER BACK TABLE 60X90IN (DRAPES) ×2 IMPLANT
COVER SURGICAL LIGHT HANDLE (MISCELLANEOUS) ×2 IMPLANT
CUP SUT UNIV REVERS 36 NEUTRAL (Cup) ×1 IMPLANT
DERMABOND ADVANCED (GAUZE/BANDAGES/DRESSINGS) ×1
DERMABOND ADVANCED .7 DNX12 (GAUZE/BANDAGES/DRESSINGS) ×1 IMPLANT
DRAPE INCISE IOBAN 66X45 STRL (DRAPES) ×1 IMPLANT
DRAPE ORTHO SPLIT 77X108 STRL (DRAPES) ×4
DRAPE SHEET LG 3/4 BI-LAMINATE (DRAPES) ×2 IMPLANT
DRAPE SURG 17X11 SM STRL (DRAPES) ×2 IMPLANT
DRAPE SURG ORHT 6 SPLT 77X108 (DRAPES) ×2 IMPLANT
DRAPE TOP 10253 STERILE (DRAPES) ×2 IMPLANT
DRAPE U-SHAPE 47X51 STRL (DRAPES) ×2 IMPLANT
DRESSING AQUACEL AG SP 3.5X6 (GAUZE/BANDAGES/DRESSINGS) ×1 IMPLANT
DRSG AQUACEL AG ADV 3.5X10 (GAUZE/BANDAGES/DRESSINGS) ×1 IMPLANT
DRSG AQUACEL AG SP 3.5X6 (GAUZE/BANDAGES/DRESSINGS) ×2
DRSG TEGADERM 8X12 (GAUZE/BANDAGES/DRESSINGS) ×2 IMPLANT
DURAPREP 26ML APPLICATOR (WOUND CARE) ×2 IMPLANT
ELECT BLADE TIP CTD 4 INCH (ELECTRODE) ×2 IMPLANT
ELECT PENCIL ROCKER SW 15FT (MISCELLANEOUS) ×2 IMPLANT
ELECT REM PT RETURN 15FT ADLT (MISCELLANEOUS) ×2 IMPLANT
FACESHIELD WRAPAROUND (MASK) ×8 IMPLANT
FACESHIELD WRAPAROUND OR TEAM (MASK) ×4 IMPLANT
GLENOID UNI REV MOD 24 +2 LAT (Joint) ×1 IMPLANT
GLENOSPHERE 36 +4 LAT/24 (Joint) ×1 IMPLANT
GLOVE SRG 8 PF TXTR STRL LF DI (GLOVE) ×1 IMPLANT
GLOVE SURG ENC MOIS LTX SZ7 (GLOVE) ×2 IMPLANT
GLOVE SURG ENC MOIS LTX SZ7.5 (GLOVE) ×2 IMPLANT
GLOVE SURG UNDER POLY LF SZ7 (GLOVE) ×2 IMPLANT
GLOVE SURG UNDER POLY LF SZ8 (GLOVE) ×2
GOWN STRL REUS W/TWL LRG LVL3 (GOWN DISPOSABLE) ×4 IMPLANT
INSERT HUMERAL UNI REVERS 36 6 (Insert) ×1 IMPLANT
KIT BASIN OR (CUSTOM PROCEDURE TRAY) ×2 IMPLANT
KIT TURNOVER KIT A (KITS) IMPLANT
MANIFOLD NEPTUNE II (INSTRUMENTS) ×2 IMPLANT
NDL TAPERED W/ NITINOL LOOP (MISCELLANEOUS) ×1 IMPLANT
NEEDLE TAPERED W/ NITINOL LOOP (MISCELLANEOUS) ×2 IMPLANT
NS IRRIG 1000ML POUR BTL (IV SOLUTION) ×2 IMPLANT
PACK SHOULDER (CUSTOM PROCEDURE TRAY) ×2 IMPLANT
PAD ARMBOARD 7.5X6 YLW CONV (MISCELLANEOUS) ×2 IMPLANT
PAD COLD SHLDR WRAP-ON (PAD) ×2 IMPLANT
PIN NITINOL TARGETER 2.8 (PIN) IMPLANT
PIN SET MODULAR GLENOID SYSTEM (PIN) ×1 IMPLANT
RESTRAINT HEAD UNIVERSAL NS (MISCELLANEOUS) ×2 IMPLANT
SCREW CENTRAL MODULAR 25 (Screw) ×1 IMPLANT
SCREW PERI LOCK 5.5X16 (Screw) ×2 IMPLANT
SCREW PERIPHERAL 5.5X28 LOCK (Screw) ×2 IMPLANT
SLING ARM FOAM STRAP LRG (SOFTGOODS) IMPLANT
SLING ARM FOAM STRAP MED (SOFTGOODS) ×1 IMPLANT
SPONGE T-LAP 18X18 ~~LOC~~+RFID (SPONGE) ×2 IMPLANT
SPONGE T-LAP 4X18 ~~LOC~~+RFID (SPONGE) ×2 IMPLANT
STEM HUMERAL UNI REVERS SZ6 (Stem) ×1 IMPLANT
SUCTION FRAZIER HANDLE 12FR (TUBING) ×2
SUCTION TUBE FRAZIER 12FR DISP (TUBING) ×1 IMPLANT
SUT FIBERWIRE #2 38 T-5 BLUE (SUTURE) ×4
SUT MNCRL AB 3-0 PS2 18 (SUTURE) ×2 IMPLANT
SUT MON AB 2-0 CT1 36 (SUTURE) ×2 IMPLANT
SUT VIC AB 1 CT1 36 (SUTURE) ×2 IMPLANT
SUTURE FIBERWR #2 38 T-5 BLUE (SUTURE) IMPLANT
SUTURE TAPE 1.3 40 TPR END (SUTURE) ×2 IMPLANT
SUTURETAPE 1.3 40 TPR END (SUTURE) ×4
TOWEL OR 17X26 10 PK STRL BLUE (TOWEL DISPOSABLE) ×2 IMPLANT
TOWEL OR NON WOVEN STRL DISP B (DISPOSABLE) ×2 IMPLANT
TUBE SUCTION HIGH CAP CLEAR NV (SUCTIONS) ×2 IMPLANT
WATER STERILE IRR 1000ML POUR (IV SOLUTION) ×4 IMPLANT
YANKAUER SUCT BULB TIP 10FT TU (MISCELLANEOUS) ×2 IMPLANT

## 2021-07-20 NOTE — Anesthesia Procedure Notes (Signed)
Procedure Name: Intubation Date/Time: 07/20/2021 7:52 AM Performed by: Genelle Bal, CRNA Pre-anesthesia Checklist: Patient identified, Emergency Drugs available, Suction available and Patient being monitored Patient Re-evaluated:Patient Re-evaluated prior to induction Oxygen Delivery Method: Circle system utilized Preoxygenation: Pre-oxygenation with 100% oxygen Induction Type: IV induction Ventilation: Mask ventilation without difficulty Laryngoscope Size: Miller and 2 Grade View: Grade I Tube type: Oral Tube size: 7.0 mm Number of attempts: 1 Airway Equipment and Method: Stylet and Oral airway Placement Confirmation: ETT inserted through vocal cords under direct vision, positive ETCO2 and breath sounds checked- equal and bilateral Secured at: 20 cm Tube secured with: Tape Dental Injury: Teeth and Oropharynx as per pre-operative assessment

## 2021-07-20 NOTE — Discharge Instructions (Signed)

## 2021-07-20 NOTE — Op Note (Signed)
07/20/2021  9:28 AM  PATIENT:   Karen Gomez  76 y.o. female  PRE-OPERATIVE DIAGNOSIS:  Left shoulder rotator cuff tear arthropathy  POST-OPERATIVE DIAGNOSIS: Same  PROCEDURE: Left shoulder reverse arthroplasty utilizing a press-fit size 6 Arthrex stem with a neutral metaphysis, +6 constrained polyethylene insert, 36/+4 glenosphere on a small/+2 baseplate  SURGEON:  Mialani Reicks, Metta Clines M.D.  ASSISTANTS: Jenetta Loges, PA-C  ANESTHESIA:   General endotracheal and interscalene block with Exparel  EBL: 150 cc  SPECIMEN: None  Drains: None   PATIENT DISPOSITION:  PACU - hemodynamically stable.    PLAN OF CARE: Discharge to home after PACU  Brief history:  Karen Gomez is a 76 year old female well-known to our practice for previous right shoulder reverse arthroplasty performed back in 2018 was done very well with her right shoulder has been more recently followed for chronic and progressively increasing left shoulder pain related to rotator cuff tear arthropathy.  Due to her increasing functional rotations and failure to respond to prolonged attempts at conservative management, she is brought to the operating this time for planned left shoulder reverse arthroplasty  Preoperatively, I counseled the patient regarding treatment options and risks versus benefits thereof.  Possible surgical complications were all reviewed including potential for bleeding, infection, neurovascular injury, persistent pain, loss of motion, anesthetic complication, failure of the implant, and possible need for additional surgery. They understand and accept and agrees with our planned procedure.   Procedure in detail:  After undergoing routine preop evaluation the patient received prophylactic antibiotics and interscalene block with Exparel was established in the holding area by the anesthesia department.  Patient was subsequently placed supine on the operating table and underwent the smooth induction of a general  endotracheal anesthesia.  Placed into the beachchair position and appropriately padded and protected.  Left shoulder girdle region was sterilely prepped and draped in standard fashion.  Timeout was called.  A deltopectoral approach left shoulder is made in approximately a centimeter incision.  Skin flaps were elevated dissection carried deeply and the deltopectoral interval was then developed from proximal to distal with the vein taken laterally.  The upper centimeter the pectoralis major was tenotomized for exposure and the conjoined tendon was mobilized and retracted medially.  The long head biceps tendon was then tenodesed at the upper border the pectoralis major tendon the proximal segment was unroofed and excised.  The rotator cuff remnant was split from the apex of the bicipital groove to the base of the coracoid and the subscap was then separated from the lesser tuberosity using electrocautery and the free margin was then tagged with a pair of suture tape sutures.  Capsular attachments were then divided from the anterior and infra margins of the humeral neck and humeral head was then delivered through the wound.  An extra medullary guide was then used to outline the proposed humeral head resection which was performed with an oscillating saw at approximately 20 degrees retroversion.  Marginal osteophytes were then removed with rongeurs and a metal cap was then placed at the cut proximal humeral surface.  The glenoid was then exposed with appropriate retractors and a circumferential labral resection was completed gaining complete visualization of the peripheral glenoid.  A guidepin was then directed into the center of the glenoid and the glenoid was reamed with the central followed by the peripheral reamer to a stable subchondral bony bed.  Preparation was then completed with the central drill and tapped for a 25 mm lag screw.  Baseplate was  assembled and the lag screw was then inserted with vancomycin powder  applied to the threads of the lag screw and excellent purchase and fixation was achieved.  All of the peripheral locking screws were then placed using standard technique with excellent fixation and stability.  A 36/+4 glenosphere was then impacted onto the baseplate and the central locking screw was placed.  Attention returned to the proximal humerus where the canal was opened hand reaming and broached up to a size 6 which was equivalent to the sizing of the contralateral shoulder and to confirm appropriate sizing radiographically on the opposite side and right with a similar size 6.  Excellent fixation was achieved.  A neutral metaphyseal reaming guide was then utilized.  In the metaphysis was prepared.  A trial implant was then placed and this showed excellent motion stability and soft tissue balance with reduction.  The trial was then removed final implant was then assembled vancomycin powder was then spread liberally into the canal after it had been cleaned and dried and the final implant was then seated with excellent purchase and fixation.  Series of trial reductions were performed and ultimately the +6 probably gave [place] the best motion stability and soft tissue balance.  The trial was removed the final +6 constrained polywas then impacted onto the implant after being cleaned and dried.  Final reduction again showed excellent motion stability and soft tissue balance.  Good elasticity of the subscap was then confirmed and the subscap was then repaired to the eyelets of the following the implant using the previously placed suture tape sutures.  This point the arm easily achieved 45 degrees of external Tatian without excessive tension on the subscap repair.  The wound was then copiously irrigated.  Hemostasis was obtained.  The balance of the vancomycin powder was then spread liberally throughout the deep soft tissue layers.  The deltopectoral interval was reapproximated with a series of figure-of-eight and 1  Vicryl suture.  2-0 Monocryl used for subcu layer and intracuticular 3 Monocryl used to close the skin followed by Dermabond and Aquacel dressing.  Left arm was then placed into a sling.  The patient was awakened, extubated, and taken to the recovery room in stable condition.  Jenetta Loges, PA-C was utilized as an Environmental consultant throughout this case, essential for help with positioning the patient, positioning extremity, tissue manipulation, implantation of the prosthesis, suture management, wound closure, and intraoperative decision-making.  Marin Shutter MD   Contact # [phone]

## 2021-07-20 NOTE — Evaluation (Signed)
Occupational Therapy Evaluation Patient Details Name: Karen Gomez MRN: [id number] DOB: 08-06-1945 Today's Date: 07/20/2021   History of Present Illness Patient is a 76 year old female s/p L reverse total shoulder arthroplasty. PMH reports hx R shoulder replacement.   Clinical Impression   Patient is a 76 year old female s/p shoulder replacement without functional use of left non-dominant upper extremity secondary to effects of surgery and interscalene block and shoulder precautions. Therapist provided education and instruction to patient and daughter in law in regards to exercises, precautions, positioning, donning upper extremity clothing and bathing while maintaining shoulder precautions, ice and edema management and donning/doffing sling. Patient and daughter in law verbalized understanding and demonstrated as needed. Patient needed assistance to donn shirt and provided with instruction on compensatory strategies to perform ADLs. Patient to follow up with MD for further therapy needs.        Recommendations for follow up therapy are one component of a multi-disciplinary discharge planning process, led by the attending physician.  Recommendations may be updated based on patient status, additional functional criteria and insurance authorization.   Follow Up Recommendations  Follow physician's recommendations for discharge plan and follow up therapies    Assistance Recommended at Discharge PRN  Patient can return home with the following A little help with bathing/dressing/bathroom;Assistance with cooking/housework       Equipment Recommendations  None recommended by OT       Precautions / Restrictions Precautions Precautions: Shoulder Type of Shoulder Precautions: If sitting in controlled environment, ok to come out of sling to give neck a break. Please sleep in it to protect until follow up in office. OK to use operative arm for feeding, hygiene and ADLs. Ok to instruct Pendulums and  lap slides as exercises. Ok to use operative arm within the following parameters for ADL purposes     Ok for PROM, AAROM, AROM within pain tolerance and within the following ROM   ER 20  ABD 45  FE 60. AROM elbow, wrist, hand ok Shoulder Interventions: Shoulder sling/immobilizer;Off for dressing/bathing/exercises Precaution Booklet Issued: Yes (comment) Required Braces or Orthoses: Sling Restrictions Weight Bearing Restrictions: Yes LUE Weight Bearing: Non weight bearing      Mobility   Transfers Overall transfer level: Independent                        Balance Overall balance assessment: Independent                                         ADL either performed or assessed with clinical judgement   ADL Overall ADL's : Needs assistance/impaired Eating/Feeding: Independent   Grooming: Independent   Upper Body Bathing: Minimal assistance   Lower Body Bathing: Independent   Upper Body Dressing : Minimal assistance;Standing;Cueing for sequencing;Cueing for compensatory techniques Upper Body Dressing Details (indicate cue type and reason): Assist to thread L UE due to numbness Lower Body Dressing: Independent   Toilet Transfer: Independent   Toileting- Clothing Manipulation and Hygiene: Independent       Functional mobility during ADLs: Independent General ADL Comments: patient and DIL educated on shoulder precautions and how to maintain during self care tasks.      Pertinent Vitals/Pain Pain Assessment Pain Assessment: Faces Faces Pain Scale: Hurts a little bit Pain Location: L UE Pain Descriptors / Indicators: Heaviness, Numbness Pain Intervention(s): Monitored during session  Hand Dominance Right   Extremity/Trunk Assessment Upper Extremity Assessment Upper Extremity Assessment: LUE deficits/detail LUE Deficits / Details: + nerve block   Lower Extremity Assessment Lower Extremity Assessment: Overall WFL for tasks assessed    Cervical / Trunk Assessment Cervical / Trunk Assessment: Normal   Communication Communication Communication: No difficulties   Cognition Arousal/Alertness: Awake/alert Behavior During Therapy: WFL for tasks assessed/performed Overall Cognitive Status: Within Functional Limits for tasks assessed                                          Exercises Exercises: Shoulder   Shoulder Instructions Shoulder Instructions Donning/doffing shirt without moving shoulder: Caregiver independent with task;Patient able to independently direct caregiver;Minimal assistance Method for sponge bathing under operated UE: Minimal assistance;Caregiver independent with task;Patient able to independently direct caregiver Donning/doffing sling/immobilizer: Moderate assistance;Caregiver independent with task;Patient able to independently direct caregiver Correct positioning of sling/immobilizer: Minimal assistance;Patient able to independently direct caregiver;Caregiver independent with task Pendulum exercises (written home exercise program): Caregiver independent with task;Patient able to independently direct caregiver ROM for elbow, wrist and digits of operated UE: Caregiver independent with task;Patient able to independently direct caregiver Sling wearing schedule (on at all times/off for ADL's): Caregiver independent with task;Patient able to independently direct caregiver Proper positioning of operated UE when showering: Caregiver independent with task;Patient able to independently direct caregiver Dressing change:  (N/A) Positioning of UE while sleeping: Patient able to independently direct caregiver;Caregiver independent with task    Home Living Family/patient expects to be discharged to:: Private residence Living Arrangements: Alone Available Help at Discharge: Family;Available 24 hours/day Type of Home: House Home Access: Stairs to enter [employer] of Steps: 3   Home Layout:  Two level;Bed/bath upstairs Alternate Level Stairs-Number of Steps: 12   Bathroom Shower/Tub: Walk-in shower;Tub/shower unit   Bathroom Toilet: Handicapped height     Home Equipment: Shower seat          Prior Functioning/Environment Prior Level of Function : Independent/Modified Independent                        OT Problem List: Pain;Impaired UE functional use;Decreased knowledge of precautions         OT Goals(Current goals can be found in the care plan section) Acute Rehab OT Goals Patient Stated Goal: Be independent as possible OT Goal Formulation: All assessment and education complete, DC therapy   AM-PAC OT "6 Clicks" Daily Activity     Outcome Measure Help from another person eating meals?: None Help from another person taking care of personal grooming?: None Help from another person toileting, which includes using toliet, bedpan, or urinal?: None Help from another person bathing (including washing, rinsing, drying)?: A Little Help from another person to put on and taking off regular upper body clothing?: A Little Help from another person to put on and taking off regular lower body clothing?: None 6 Click Score: 22   End of Session Equipment Utilized During Treatment: Other (comment) (sling) Nurse Communication: Other (comment) (OT complete)  Activity Tolerance: Patient tolerated treatment well Patient left: in chair;with call bell/phone within reach;with family/visitor present  OT Visit Diagnosis: Pain Pain - Right/Left: Left Pain - part of body: Shoulder                Time: 4097-3532 OT Time Calculation (min): 55 min Charges:  OT General Charges $OT  Visit: 1 Visit OT Evaluation $OT Eval Low Complexity: 1 Low OT Treatments $Self Care/Home Management : 38-52 mins  Delbert Phenix OT OT pager: [place] 07/20/2021, 1:04 PM

## 2021-07-20 NOTE — Transfer of Care (Signed)
Immediate Anesthesia Transfer of Care Note  Patient: Karen Gomez  Procedure(s) Performed: REVERSE SHOULDER ARTHROPLASTY (Left: Shoulder)  Patient Location: PACU  Anesthesia Type:GA combined with regional for post-op pain  Level of Consciousness: awake, alert  and oriented  Airway & Oxygen Therapy: Patient Spontanous Breathing and Patient connected to face mask oxygen  Post-op Assessment: Report given to RN and Post -op Vital signs reviewed and stable  Post vital signs: Reviewed and stable  Last Vitals:  Vitals Value Taken Time  BP 156/68 07/20/21 0930  Temp    Pulse 74 07/20/21 0932  Resp 11 07/20/21 0932  SpO2 100 % 07/20/21 0932  Vitals shown include unvalidated device data.  Last Pain:  Vitals:   07/20/21 0619  TempSrc:   PainSc: 0-No pain         Complications: No notable events documented.

## 2021-07-20 NOTE — Progress Notes (Signed)
[employer] rep. Called back and states he is just now leaving [facility] hospital and is on his way to [facility].

## 2021-07-20 NOTE — Progress Notes (Signed)
[place], [employer] rep was stated in computer as being the rep that would be present for patients surgery today at 0730.  Called him to get an ETA and his voicemail states he is out of the [place] until Feb.10 and to call [phone] number for assistance.  Called [employer] and explained scenario and they are paging local rep now.

## 2021-07-20 NOTE — H&P (Signed)
Karen Gomez    Chief Complaint: Left shoulder rotator cuff tear arthropathy HPI: The patient is a 76 y.o. female with chronic and progressively increasing left shoulder pain related to severe rotator cuff tear arthropathy.  Due to her increasing functional imitations and failure to respond to prolonged attempts at conservative management, she is brought to the operating room this time for planned left shoulder reverse arthroplasty.  Past Medical History:  Diagnosis Date   Arthritis    "right knee, hands, cervical & lumbar" (11/29/2017)   DDD (degenerative disc disease), cervical    DDD (degenerative disc disease), lumbar    Degenerative scoliosis in adult patient    Depression    History of loop recorder- removed 11/29/17 11/30/2017   Hypertension    Nerve pain    CYST IN LOWER LUMBAR SPINE   Pneumonia 1990s X 1   PONV (postoperative nausea and vomiting)    WHEN SHE HAD NECK SURGERY ?2002, HAD SOME N/V, NONE WITH HER OTHER SURGERIES   Presence of permanent cardiac pacemaker 11/29/2017   RBBB    S/P placement of cardiac pacemaker 11/29/17 MDT 11/30/2017    Past Surgical History:  Procedure Laterality Date   ANTERIOR CERVICAL DECOMP/DISCECTOMY FUSION  ~ 2002   PLACEMENT OF TITANIUM PLATE   BACK SURGERY     CATARACT EXTRACTION W/ INTRAOCULAR LENS  IMPLANT, BILATERAL     CESAREAN SECTION  1973; [place] / REPLACE / REMOVE PACEMAKER  11/29/2017   LEFT HEART CATH AND CORONARY ANGIOGRAPHY N/A 03/27/2018   Procedure: LEFT HEART CATH AND CORONARY ANGIOGRAPHY;  Surgeon: Troy Sine, MD;  Location: [place] [facility];  Service: Cardiovascular;  Laterality: N/A;   LEFT WRIST SURGERY      LOOP RECORDER INSERTION N/A 07/09/2017   Procedure: LOOP RECORDER INSERTION;  Surgeon: Constance Haw, MD;  Location: [place] [facility];  Service: Cardiovascular;  Laterality: N/A;   LOOP RECORDER REMOVAL  11/29/2017    LOOP RECORDER REMOVAL N/A 11/29/2017   Procedure: LOOP RECORDER REMOVAL;  Surgeon: Constance Haw, MD;  Location: [place] [facility];  Service: Cardiovascular;  Laterality: N/A;   PACEMAKER IMPLANT N/A 11/29/2017   Procedure: PACEMAKER IMPLANT;  Surgeon: Constance Haw, MD;  Location: [place] [facility];  Service: Cardiovascular;  Laterality: N/A;   RADIOACTIVE SEED GUIDED EXCISIONAL BREAST BIOPSY Right 05/28/2019   Procedure: RADIOACTIVE SEED GUIDED EXCISIONAL RIGHT  BREAST BIOPSY;  Surgeon: Rolm Bookbinder, MD;  Location: [place];  Service: General;  Laterality: Right;   REVERSE SHOULDER ARTHROPLASTY Right 06/28/2016   Procedure: REVERSE SHOULDER ARTHROPLASTY;  Surgeon: Justice Britain, MD;  Location: [place];  Service: Orthopedics;  Laterality: Right;   TONSILLECTOMY  1940s   UTERINE POLYPS      Family History  Problem Relation Age of Onset   Dementia Mother    Heart disease Father    Heart disease Brother     Social History:  reports that she has never smoked. She has never used smokeless tobacco. She reports current alcohol use of about 14.0 standard drinks per week. She reports that she does not use drugs.   Medications Prior to Admission  Medication Sig Dispense Refill   alendronate (FOSAMAX) 70 MG tablet Take 70 mg by mouth every Saturday.     Ascorbic Acid (VITAMIN C) 1000 MG tablet Take 1,000 mg by mouth daily.  calcium-vitamin D (OSCAL WITH D) 500-200 MG-UNIT tablet Take 1 tablet by mouth daily.     cholecalciferol (VITAMIN D) 1000 units tablet Take 1,000 Units by mouth daily.     escitalopram (LEXAPRO) 10 MG tablet Take 10 mg by mouth daily.     gabapentin (NEURONTIN) 300 MG capsule Take 300 mg by mouth at bedtime.      HYDROcodone-acetaminophen (NORCO) 10-325 MG tablet Take 0.5 tablets by mouth daily as needed for moderate pain.     lisinopril (ZESTRIL) 5 MG tablet Take 5 mg by mouth daily.     Magnesium 250 MG TABS Take 250 mg by mouth  daily.     Multiple Vitamin (MULTIVITAMIN WITH MINERALS) TABS tablet Take 1 tablet by mouth daily. Centrum Silver     Probiotic Product (ALIGN) 4 MG CAPS Take 4 mg by mouth daily.     Propylene Glycol (SYSTANE BALANCE) 0.6 % SOLN Apply 1 drop to eye daily as needed (dry eyes).     clindamycin (CLEOCIN) 150 MG capsule Take 150 mg by mouth 3 (three) times daily.       Physical Exam: Left shoulder demonstrates painful and guarded motion as noted at recent office visits.  She has globally decreased strength to manual muscle testing.  Radiographs confirm characteristic changes consistent with chronic rotator cuff tear arthropathy.  Vitals  Temp:  [98.5 F (36.9 C)] 98.5 F (36.9 C) (02/02 0616) Pulse Rate:  [67] 67 (02/02 0616) Resp:  [16] 16 (02/02 0616) BP: (126)/(59) 126/59 (02/02 0616) SpO2:  [100 %] 100 % (02/02 0616) Weight:  [50.8 kg] 50.8 kg (02/02 0620)  Assessment/Plan  Impression: Left shoulder rotator cuff tear arthropathy  Plan of Action: Procedure(s): REVERSE SHOULDER ARTHROPLASTY  Karen Gomez 07/20/2021, 6:34 AM Contact # [phone]

## 2021-07-20 NOTE — Anesthesia Postprocedure Evaluation (Signed)
Anesthesia Post Note  Patient: Karen Gomez  Procedure(s) Performed: REVERSE SHOULDER ARTHROPLASTY (Left: Shoulder)     Patient location during evaluation: PACU Anesthesia Type: General Level of consciousness: awake and alert Pain management: pain level controlled Vital Signs Assessment: post-procedure vital signs reviewed and stable Respiratory status: spontaneous breathing, nonlabored ventilation, respiratory function stable and patient connected to nasal cannula oxygen Cardiovascular status: blood pressure returned to baseline and stable Postop Assessment: no apparent nausea or vomiting Anesthetic complications: no   No notable events documented.  Last Vitals:  Vitals:   07/20/21 1113 07/20/21 1200  BP: 133/69 129/70  Pulse: 77 78  Resp: 14 15  Temp: 36.5 C   SpO2: 99% 97%    Last Pain:  Vitals:   07/20/21 1200  TempSrc:   PainSc: 0-No pain                 Tiajuana Amass

## 2021-07-20 NOTE — Anesthesia Procedure Notes (Signed)
Anesthesia Regional Block: Interscalene brachial plexus block   Pre-Anesthetic Checklist: , timeout performed,  Correct Patient, Correct Site, Correct Laterality,  Correct Procedure, Correct Position, site marked,  Risks and benefits discussed,  Surgical consent,  Pre-op evaluation,  At surgeon's request and post-op pain management  Laterality: Left  Prep: chloraprep       Needles:  Injection technique: Single-shot  Needle Type: Echogenic Stimulator Needle     Needle Length: 9cm  Needle Gauge: 21     Additional Needles:   Procedures:, nerve stimulator,,, ultrasound used (permanent image in chart),,     Nerve Stimulator or Paresthesia:  Response: deltoid and bicep, 0.5 mA  Additional Responses:   Narrative:  Start time: 07/20/2021 7:10 AM End time: 07/20/2021 7:17 AM Injection made incrementally with aspirations every 5 mL.  Performed by: Personally  Anesthesiologist: Suzette Battiest, MD

## 2021-07-21 ENCOUNTER — Encounter (HOSPITAL_COMMUNITY): Payer: Self-pay | Admitting: Orthopedic Surgery

## 2021-07-22 DIAGNOSIS — I890 Lymphedema, not elsewhere classified: Secondary | ICD-10-CM | POA: Diagnosis not present

## 2021-07-22 DIAGNOSIS — Z471 Aftercare following joint replacement surgery: Secondary | ICD-10-CM | POA: Diagnosis not present

## 2021-07-22 DIAGNOSIS — M25512 Pain in left shoulder: Secondary | ICD-10-CM | POA: Diagnosis not present

## 2021-07-22 DIAGNOSIS — M19012 Primary osteoarthritis, left shoulder: Secondary | ICD-10-CM | POA: Diagnosis not present

## 2021-08-04 DIAGNOSIS — Z4789 Encounter for other orthopedic aftercare: Secondary | ICD-10-CM | POA: Diagnosis not present

## 2021-08-08 DIAGNOSIS — M25612 Stiffness of left shoulder, not elsewhere classified: Secondary | ICD-10-CM | POA: Diagnosis not present

## 2021-08-08 DIAGNOSIS — M25512 Pain in left shoulder: Secondary | ICD-10-CM | POA: Diagnosis not present

## 2021-08-11 DIAGNOSIS — M25612 Stiffness of left shoulder, not elsewhere classified: Secondary | ICD-10-CM | POA: Diagnosis not present

## 2021-08-11 DIAGNOSIS — M25512 Pain in left shoulder: Secondary | ICD-10-CM | POA: Diagnosis not present

## 2021-08-15 DIAGNOSIS — M25512 Pain in left shoulder: Secondary | ICD-10-CM | POA: Diagnosis not present

## 2021-08-15 DIAGNOSIS — M25612 Stiffness of left shoulder, not elsewhere classified: Secondary | ICD-10-CM | POA: Diagnosis not present

## 2021-08-18 DIAGNOSIS — M25512 Pain in left shoulder: Secondary | ICD-10-CM | POA: Diagnosis not present

## 2021-08-18 DIAGNOSIS — M25612 Stiffness of left shoulder, not elsewhere classified: Secondary | ICD-10-CM | POA: Diagnosis not present

## 2021-08-22 DIAGNOSIS — M25512 Pain in left shoulder: Secondary | ICD-10-CM | POA: Diagnosis not present

## 2021-08-22 DIAGNOSIS — M25612 Stiffness of left shoulder, not elsewhere classified: Secondary | ICD-10-CM | POA: Diagnosis not present

## 2021-08-25 DIAGNOSIS — M25612 Stiffness of left shoulder, not elsewhere classified: Secondary | ICD-10-CM | POA: Diagnosis not present

## 2021-08-25 DIAGNOSIS — M25512 Pain in left shoulder: Secondary | ICD-10-CM | POA: Diagnosis not present

## 2021-08-28 DIAGNOSIS — M25512 Pain in left shoulder: Secondary | ICD-10-CM | POA: Diagnosis not present

## 2021-08-28 DIAGNOSIS — M25612 Stiffness of left shoulder, not elsewhere classified: Secondary | ICD-10-CM | POA: Diagnosis not present

## 2021-08-29 DIAGNOSIS — M47816 Spondylosis without myelopathy or radiculopathy, lumbar region: Secondary | ICD-10-CM | POA: Diagnosis not present

## 2021-08-29 DIAGNOSIS — M5136 Other intervertebral disc degeneration, lumbar region: Secondary | ICD-10-CM | POA: Diagnosis not present

## 2021-08-29 DIAGNOSIS — G894 Chronic pain syndrome: Secondary | ICD-10-CM | POA: Diagnosis not present

## 2021-08-29 DIAGNOSIS — Z79891 Long term (current) use of opiate analgesic: Secondary | ICD-10-CM | POA: Diagnosis not present

## 2021-09-01 DIAGNOSIS — M25512 Pain in left shoulder: Secondary | ICD-10-CM | POA: Diagnosis not present

## 2021-09-01 DIAGNOSIS — M25612 Stiffness of left shoulder, not elsewhere classified: Secondary | ICD-10-CM | POA: Diagnosis not present

## 2021-09-04 DIAGNOSIS — M25512 Pain in left shoulder: Secondary | ICD-10-CM | POA: Diagnosis not present

## 2021-09-04 DIAGNOSIS — M25612 Stiffness of left shoulder, not elsewhere classified: Secondary | ICD-10-CM | POA: Diagnosis not present

## 2021-09-07 DIAGNOSIS — M25512 Pain in left shoulder: Secondary | ICD-10-CM | POA: Diagnosis not present

## 2021-09-07 DIAGNOSIS — M25612 Stiffness of left shoulder, not elsewhere classified: Secondary | ICD-10-CM | POA: Diagnosis not present

## 2021-09-12 ENCOUNTER — Ambulatory Visit (INDEPENDENT_AMBULATORY_CARE_PROVIDER_SITE_OTHER): Payer: Medicare Other

## 2021-09-12 DIAGNOSIS — I442 Atrioventricular block, complete: Secondary | ICD-10-CM | POA: Diagnosis not present

## 2021-09-12 DIAGNOSIS — M25612 Stiffness of left shoulder, not elsewhere classified: Secondary | ICD-10-CM | POA: Diagnosis not present

## 2021-09-12 DIAGNOSIS — M25512 Pain in left shoulder: Secondary | ICD-10-CM | POA: Diagnosis not present

## 2021-09-12 LAB — CUP PACEART REMOTE DEVICE CHECK
Battery Remaining Longevity: 120 mo
Battery Voltage: 3.01 V
Brady Statistic AP VP Percent: 0.02 %
Brady Statistic AP VS Percent: 29.77 %
Brady Statistic AS VP Percent: 0.04 %
Brady Statistic AS VS Percent: 70.16 %
Brady Statistic RA Percent Paced: 29.8 %
Brady Statistic RV Percent Paced: 0.06 %
Date Time Interrogation Session: 20230328011420
Implantable Lead Implant Date: 20190614
Implantable Lead Implant Date: 20190614
Implantable Lead Location: 753859
Implantable Lead Location: 753860
Implantable Lead Model: 5076
Implantable Lead Model: 5076
Implantable Pulse Generator Implant Date: 20190614
Lead Channel Impedance Value: 266 Ohm
Lead Channel Impedance Value: 304 Ohm
Lead Channel Impedance Value: 380 Ohm
Lead Channel Impedance Value: 456 Ohm
Lead Channel Pacing Threshold Amplitude: 0.625 V
Lead Channel Pacing Threshold Amplitude: 1.25 V
Lead Channel Pacing Threshold Pulse Width: 0.4 ms
Lead Channel Pacing Threshold Pulse Width: 0.4 ms
Lead Channel Sensing Intrinsic Amplitude: 13 mV
Lead Channel Sensing Intrinsic Amplitude: 13 mV
Lead Channel Sensing Intrinsic Amplitude: 3.375 mV
Lead Channel Sensing Intrinsic Amplitude: 3.375 mV
Lead Channel Setting Pacing Amplitude: 2.5 V
Lead Channel Setting Pacing Amplitude: 2.5 V
Lead Channel Setting Pacing Pulse Width: 0.4 ms
Lead Channel Setting Sensing Sensitivity: 2.8 mV

## 2021-09-14 DIAGNOSIS — M25612 Stiffness of left shoulder, not elsewhere classified: Secondary | ICD-10-CM | POA: Diagnosis not present

## 2021-09-14 DIAGNOSIS — M25512 Pain in left shoulder: Secondary | ICD-10-CM | POA: Diagnosis not present

## 2021-09-19 DIAGNOSIS — M25512 Pain in left shoulder: Secondary | ICD-10-CM | POA: Diagnosis not present

## 2021-09-19 DIAGNOSIS — M25612 Stiffness of left shoulder, not elsewhere classified: Secondary | ICD-10-CM | POA: Diagnosis not present

## 2021-09-25 NOTE — Progress Notes (Signed)
Remote pacemaker transmission.   

## 2021-09-26 DIAGNOSIS — M25512 Pain in left shoulder: Secondary | ICD-10-CM | POA: Diagnosis not present

## 2021-09-26 DIAGNOSIS — M25612 Stiffness of left shoulder, not elsewhere classified: Secondary | ICD-10-CM | POA: Diagnosis not present

## 2021-09-29 DIAGNOSIS — M25512 Pain in left shoulder: Secondary | ICD-10-CM | POA: Diagnosis not present

## 2021-09-29 DIAGNOSIS — M25612 Stiffness of left shoulder, not elsewhere classified: Secondary | ICD-10-CM | POA: Diagnosis not present

## 2021-10-03 DIAGNOSIS — M25612 Stiffness of left shoulder, not elsewhere classified: Secondary | ICD-10-CM | POA: Diagnosis not present

## 2021-10-05 DIAGNOSIS — M25612 Stiffness of left shoulder, not elsewhere classified: Secondary | ICD-10-CM | POA: Diagnosis not present

## 2021-10-05 DIAGNOSIS — M25512 Pain in left shoulder: Secondary | ICD-10-CM | POA: Diagnosis not present

## 2021-10-10 DIAGNOSIS — M25612 Stiffness of left shoulder, not elsewhere classified: Secondary | ICD-10-CM | POA: Diagnosis not present

## 2021-10-10 DIAGNOSIS — M25512 Pain in left shoulder: Secondary | ICD-10-CM | POA: Diagnosis not present

## 2021-10-12 DIAGNOSIS — M25512 Pain in left shoulder: Secondary | ICD-10-CM | POA: Diagnosis not present

## 2021-10-12 DIAGNOSIS — M25612 Stiffness of left shoulder, not elsewhere classified: Secondary | ICD-10-CM | POA: Diagnosis not present

## 2021-10-16 DIAGNOSIS — M25612 Stiffness of left shoulder, not elsewhere classified: Secondary | ICD-10-CM | POA: Diagnosis not present

## 2021-10-16 DIAGNOSIS — M25512 Pain in left shoulder: Secondary | ICD-10-CM | POA: Diagnosis not present

## 2021-10-18 DIAGNOSIS — Z96612 Presence of left artificial shoulder joint: Secondary | ICD-10-CM | POA: Diagnosis not present

## 2021-10-18 DIAGNOSIS — M25612 Stiffness of left shoulder, not elsewhere classified: Secondary | ICD-10-CM | POA: Diagnosis not present

## 2021-10-18 DIAGNOSIS — M25512 Pain in left shoulder: Secondary | ICD-10-CM | POA: Diagnosis not present

## 2021-10-18 DIAGNOSIS — Z471 Aftercare following joint replacement surgery: Secondary | ICD-10-CM | POA: Diagnosis not present

## 2021-10-24 DIAGNOSIS — M25612 Stiffness of left shoulder, not elsewhere classified: Secondary | ICD-10-CM | POA: Diagnosis not present

## 2021-10-24 DIAGNOSIS — M25512 Pain in left shoulder: Secondary | ICD-10-CM | POA: Diagnosis not present

## 2021-10-26 DIAGNOSIS — M25512 Pain in left shoulder: Secondary | ICD-10-CM | POA: Diagnosis not present

## 2021-10-26 DIAGNOSIS — M25612 Stiffness of left shoulder, not elsewhere classified: Secondary | ICD-10-CM | POA: Diagnosis not present

## 2021-10-31 DIAGNOSIS — M25612 Stiffness of left shoulder, not elsewhere classified: Secondary | ICD-10-CM | POA: Diagnosis not present

## 2021-11-02 DIAGNOSIS — M25512 Pain in left shoulder: Secondary | ICD-10-CM | POA: Diagnosis not present

## 2021-11-02 DIAGNOSIS — M25612 Stiffness of left shoulder, not elsewhere classified: Secondary | ICD-10-CM | POA: Diagnosis not present

## 2021-11-09 DIAGNOSIS — M25612 Stiffness of left shoulder, not elsewhere classified: Secondary | ICD-10-CM | POA: Diagnosis not present

## 2021-11-29 DIAGNOSIS — M25561 Pain in right knee: Secondary | ICD-10-CM | POA: Diagnosis not present

## 2021-12-12 ENCOUNTER — Ambulatory Visit (INDEPENDENT_AMBULATORY_CARE_PROVIDER_SITE_OTHER): Payer: Medicare Other

## 2021-12-12 DIAGNOSIS — I442 Atrioventricular block, complete: Secondary | ICD-10-CM | POA: Diagnosis not present

## 2021-12-15 LAB — CUP PACEART REMOTE DEVICE CHECK
Battery Remaining Longevity: 118 mo
Battery Voltage: 3.01 V
Brady Statistic AP VP Percent: 0.02 %
Brady Statistic AP VS Percent: 41.4 %
Brady Statistic AS VP Percent: 0.02 %
Brady Statistic AS VS Percent: 58.56 %
Brady Statistic RA Percent Paced: 41.44 %
Brady Statistic RV Percent Paced: 0.04 %
Date Time Interrogation Session: 20230630005511
Implantable Lead Implant Date: 20190614
Implantable Lead Implant Date: 20190614
Implantable Lead Location: 753859
Implantable Lead Location: 753860
Implantable Lead Model: 5076
Implantable Lead Model: 5076
Implantable Pulse Generator Implant Date: 20190614
Lead Channel Impedance Value: 285 Ohm
Lead Channel Impedance Value: 342 Ohm
Lead Channel Impedance Value: 399 Ohm
Lead Channel Impedance Value: 418 Ohm
Lead Channel Pacing Threshold Amplitude: 0.625 V
Lead Channel Pacing Threshold Amplitude: 1.125 V
Lead Channel Pacing Threshold Pulse Width: 0.4 ms
Lead Channel Pacing Threshold Pulse Width: 0.4 ms
Lead Channel Sensing Intrinsic Amplitude: 16 mV
Lead Channel Sensing Intrinsic Amplitude: 16 mV
Lead Channel Sensing Intrinsic Amplitude: 3.875 mV
Lead Channel Sensing Intrinsic Amplitude: 3.875 mV
Lead Channel Setting Pacing Amplitude: 2.25 V
Lead Channel Setting Pacing Amplitude: 2.5 V
Lead Channel Setting Pacing Pulse Width: 0.4 ms
Lead Channel Setting Sensing Sensitivity: 2.8 mV

## 2021-12-18 DIAGNOSIS — M8588 Other specified disorders of bone density and structure, other site: Secondary | ICD-10-CM | POA: Diagnosis not present

## 2021-12-18 DIAGNOSIS — I10 Essential (primary) hypertension: Secondary | ICD-10-CM | POA: Diagnosis not present

## 2021-12-18 DIAGNOSIS — R634 Abnormal weight loss: Secondary | ICD-10-CM | POA: Diagnosis not present

## 2022-01-03 NOTE — Progress Notes (Signed)
Remote pacemaker transmission.   

## 2022-01-10 DIAGNOSIS — H35371 Puckering of macula, right eye: Secondary | ICD-10-CM | POA: Diagnosis not present

## 2022-01-10 DIAGNOSIS — Z961 Presence of intraocular lens: Secondary | ICD-10-CM | POA: Diagnosis not present

## 2022-03-13 ENCOUNTER — Ambulatory Visit (INDEPENDENT_AMBULATORY_CARE_PROVIDER_SITE_OTHER): Payer: Medicare Other

## 2022-03-13 DIAGNOSIS — I495 Sick sinus syndrome: Secondary | ICD-10-CM | POA: Diagnosis not present

## 2022-03-14 DIAGNOSIS — F4321 Adjustment disorder with depressed mood: Secondary | ICD-10-CM | POA: Diagnosis not present

## 2022-03-14 DIAGNOSIS — I10 Essential (primary) hypertension: Secondary | ICD-10-CM | POA: Diagnosis not present

## 2022-03-14 DIAGNOSIS — Z0000 Encounter for general adult medical examination without abnormal findings: Secondary | ICD-10-CM | POA: Diagnosis not present

## 2022-03-14 DIAGNOSIS — Z950 Presence of cardiac pacemaker: Secondary | ICD-10-CM | POA: Diagnosis not present

## 2022-03-14 DIAGNOSIS — D7589 Other specified diseases of blood and blood-forming organs: Secondary | ICD-10-CM | POA: Diagnosis not present

## 2022-03-14 LAB — CUP PACEART REMOTE DEVICE CHECK
Battery Remaining Longevity: 115 mo
Battery Voltage: 2.99 V
Brady Statistic AP VP Percent: 22.34 %
Brady Statistic AP VS Percent: 17.44 %
Brady Statistic AS VP Percent: 33.49 %
Brady Statistic AS VS Percent: 26.73 %
Brady Statistic RA Percent Paced: 39.63 %
Brady Statistic RV Percent Paced: 55.83 %
Date Time Interrogation Session: 20230926011019
Implantable Lead Implant Date: 20190614
Implantable Lead Implant Date: 20190614
Implantable Lead Location: 753859
Implantable Lead Location: 753860
Implantable Lead Model: 5076
Implantable Lead Model: 5076
Implantable Pulse Generator Implant Date: 20190614
Lead Channel Impedance Value: 304 Ohm
Lead Channel Impedance Value: 323 Ohm
Lead Channel Impedance Value: 399 Ohm
Lead Channel Impedance Value: 494 Ohm
Lead Channel Pacing Threshold Amplitude: 0.5 V
Lead Channel Pacing Threshold Amplitude: 0.75 V
Lead Channel Pacing Threshold Pulse Width: 0.4 ms
Lead Channel Pacing Threshold Pulse Width: 0.4 ms
Lead Channel Sensing Intrinsic Amplitude: 19 mV
Lead Channel Sensing Intrinsic Amplitude: 19 mV
Lead Channel Sensing Intrinsic Amplitude: 3.875 mV
Lead Channel Sensing Intrinsic Amplitude: 3.875 mV
Lead Channel Setting Pacing Amplitude: 2 V
Lead Channel Setting Pacing Amplitude: 2.5 V
Lead Channel Setting Pacing Pulse Width: 0.4 ms
Lead Channel Setting Sensing Sensitivity: 2.8 mV

## 2022-03-23 NOTE — Progress Notes (Signed)
Remote pacemaker transmission.   

## 2022-04-02 DIAGNOSIS — Z23 Encounter for immunization: Secondary | ICD-10-CM | POA: Diagnosis not present

## 2022-06-05 DIAGNOSIS — E440 Moderate protein-calorie malnutrition: Secondary | ICD-10-CM | POA: Diagnosis not present

## 2022-06-05 DIAGNOSIS — K580 Irritable bowel syndrome with diarrhea: Secondary | ICD-10-CM | POA: Diagnosis not present

## 2022-06-05 DIAGNOSIS — F419 Anxiety disorder, unspecified: Secondary | ICD-10-CM | POA: Diagnosis not present

## 2022-06-05 DIAGNOSIS — F4321 Adjustment disorder with depressed mood: Secondary | ICD-10-CM | POA: Diagnosis not present

## 2022-06-12 ENCOUNTER — Ambulatory Visit (INDEPENDENT_AMBULATORY_CARE_PROVIDER_SITE_OTHER): Payer: Medicare Other

## 2022-06-12 DIAGNOSIS — I495 Sick sinus syndrome: Secondary | ICD-10-CM

## 2022-06-12 LAB — CUP PACEART REMOTE DEVICE CHECK
Battery Remaining Longevity: 107 mo
Battery Voltage: 2.99 V
Brady Statistic AP VP Percent: 31.03 %
Brady Statistic AP VS Percent: 0.32 %
Brady Statistic AS VP Percent: 68.5 %
Brady Statistic AS VS Percent: 0.15 %
Brady Statistic RA Percent Paced: 31.31 %
Brady Statistic RV Percent Paced: 99.53 %
Date Time Interrogation Session: 20231226001657
Implantable Lead Connection Status: 753985
Implantable Lead Connection Status: 753985
Implantable Lead Implant Date: 20190614
Implantable Lead Implant Date: 20190614
Implantable Lead Location: 753859
Implantable Lead Location: 753860
Implantable Lead Model: 5076
Implantable Lead Model: 5076
Implantable Pulse Generator Implant Date: 20190614
Lead Channel Impedance Value: 266 Ohm
Lead Channel Impedance Value: 304 Ohm
Lead Channel Impedance Value: 361 Ohm
Lead Channel Impedance Value: 456 Ohm
Lead Channel Pacing Threshold Amplitude: 0.625 V
Lead Channel Pacing Threshold Amplitude: 0.875 V
Lead Channel Pacing Threshold Pulse Width: 0.4 ms
Lead Channel Pacing Threshold Pulse Width: 0.4 ms
Lead Channel Sensing Intrinsic Amplitude: 16.625 mV
Lead Channel Sensing Intrinsic Amplitude: 16.625 mV
Lead Channel Sensing Intrinsic Amplitude: 3.5 mV
Lead Channel Sensing Intrinsic Amplitude: 3.5 mV
Lead Channel Setting Pacing Amplitude: 2.25 V
Lead Channel Setting Pacing Amplitude: 2.5 V
Lead Channel Setting Pacing Pulse Width: 0.4 ms
Lead Channel Setting Sensing Sensitivity: 2.8 mV
Zone Setting Status: 755011
Zone Setting Status: 755011

## 2022-06-27 DIAGNOSIS — K080 Exfoliation of teeth due to systemic causes: Secondary | ICD-10-CM | POA: Diagnosis not present

## 2022-07-05 NOTE — Progress Notes (Signed)
Remote pacemaker transmission.   

## 2022-07-10 DIAGNOSIS — Z01419 Encounter for gynecological examination (general) (routine) without abnormal findings: Secondary | ICD-10-CM | POA: Diagnosis not present

## 2022-07-10 DIAGNOSIS — Z681 Body mass index (BMI) 19 or less, adult: Secondary | ICD-10-CM | POA: Diagnosis not present

## 2022-07-10 DIAGNOSIS — Z1231 Encounter for screening mammogram for malignant neoplasm of breast: Secondary | ICD-10-CM | POA: Diagnosis not present

## 2022-07-10 DIAGNOSIS — M8588 Other specified disorders of bone density and structure, other site: Secondary | ICD-10-CM | POA: Diagnosis not present

## 2022-07-18 DIAGNOSIS — F320 Major depressive disorder, single episode, mild: Secondary | ICD-10-CM | POA: Diagnosis not present

## 2022-08-14 ENCOUNTER — Encounter: Payer: Self-pay | Admitting: Cardiology

## 2022-08-14 ENCOUNTER — Ambulatory Visit: Payer: Medicare Other | Attending: Cardiology | Admitting: Cardiology

## 2022-08-14 VITALS — BP 130/72 | HR 60 | Ht 62.5 in | Wt 105.4 lb

## 2022-08-14 DIAGNOSIS — I442 Atrioventricular block, complete: Secondary | ICD-10-CM | POA: Diagnosis not present

## 2022-08-14 NOTE — Progress Notes (Signed)
Electrophysiology Office Note   Date:  08/14/2022   ID:  Karen, Gomez May 08, 1946, MRN [id number]  PCP:  Harlan Stains, MD Primary Electrophysiologist:  Terrionna Bridwell Meredith Leeds, MD    No chief complaint on file.    History of Present Illness: Karen Gomez is a 77 y.o. female who presents today for electrophysiology evaluation.   She has a history of hypertension.   She has had multiple episodes of syncope.  She was waiting in line to see Gaylyn Rong at [facility] time.  She was waiting in line for approximately 20 minutes.  Per her family she slumped down and sustained no trauma.  She was unconscious for 5 to 10 seconds.  A few days later she had more episodes.  She was standing in line for all multiple hours with her grandchildren.  She had 2 other episodes of syncope while standing.  She is post Medtronic dual-chamber pacemaker implanted 11/29/2017 for intermittent complete heart block.  Today, denies symptoms of palpitations, chest pain, shortness of breath, orthopnea, PND, lower extremity edema, claudication, dizziness, presyncope, syncope, bleeding, or neurologic sequela. The patient is tolerating medications without difficulties.     Past Medical History:  Diagnosis Date   Arthritis    "right knee, hands, cervical & lumbar" (11/29/2017)   DDD (degenerative disc disease), cervical    DDD (degenerative disc disease), lumbar    Degenerative scoliosis in adult patient    Depression    History of loop recorder- removed 11/29/17 11/30/2017   Hypertension    Nerve pain    CYST IN LOWER LUMBAR SPINE   Pneumonia 1990s X 1   PONV (postoperative nausea and vomiting)    WHEN SHE HAD NECK SURGERY ?2002, HAD SOME N/V, NONE WITH HER OTHER SURGERIES   Presence of permanent cardiac pacemaker 11/29/2017   RBBB    S/P placement of cardiac pacemaker 11/29/17 MDT 11/30/2017   Past Surgical History:  Procedure Laterality Date   ANTERIOR CERVICAL DECOMP/DISCECTOMY FUSION  ~ 2002   PLACEMENT  OF TITANIUM PLATE   BACK SURGERY     CATARACT EXTRACTION W/ INTRAOCULAR LENS  IMPLANT, BILATERAL     CESAREAN SECTION  1973; [place] / REPLACE / REMOVE PACEMAKER  11/29/2017   LEFT HEART CATH AND CORONARY ANGIOGRAPHY N/A 03/27/2018   Procedure: LEFT HEART CATH AND CORONARY ANGIOGRAPHY;  Surgeon: Troy Sine, MD;  Location: [place] [facility];  Service: Cardiovascular;  Laterality: N/A;   LEFT WRIST SURGERY      LOOP RECORDER INSERTION N/A 07/09/2017   Procedure: LOOP RECORDER INSERTION;  Surgeon: Constance Haw, MD;  Location: [place] [facility];  Service: Cardiovascular;  Laterality: N/A;   LOOP RECORDER REMOVAL  11/29/2017   LOOP RECORDER REMOVAL N/A 11/29/2017   Procedure: LOOP RECORDER REMOVAL;  Surgeon: Constance Haw, MD;  Location: [place] [facility];  Service: Cardiovascular;  Laterality: N/A;   PACEMAKER IMPLANT N/A 11/29/2017   Procedure: PACEMAKER IMPLANT;  Surgeon: Constance Haw, MD;  Location: [place] [facility];  Service: Cardiovascular;  Laterality: N/A;   RADIOACTIVE SEED GUIDED EXCISIONAL BREAST BIOPSY Right 05/28/2019   Procedure: RADIOACTIVE SEED GUIDED EXCISIONAL RIGHT  BREAST BIOPSY;  Surgeon: Rolm Bookbinder, MD;  Location: [place];  Service: General;  Laterality: Right;   REVERSE SHOULDER ARTHROPLASTY Right 06/28/2016   Procedure: REVERSE SHOULDER ARTHROPLASTY;  Surgeon: Lennette Bihari  Supple, MD;  Location: [place];  Service: Orthopedics;  Laterality: Right;   REVERSE SHOULDER ARTHROPLASTY Left 07/20/2021   Procedure: REVERSE SHOULDER ARTHROPLASTY;  Surgeon: Justice Britain, MD;  Location: [facility];  Service: Orthopedics;  Laterality: Left;  156mn   TONSILLECTOMY  1940s   UTERINE POLYPS       Current Outpatient Medications  Medication Sig Dispense Refill   Ascorbic Acid (VITAMIN C) 1000 MG tablet Take 1,000 mg by mouth daily.     calcium-vitamin D (OSCAL WITH  D) 500-200 MG-UNIT tablet Take 1 tablet by mouth daily.     cholecalciferol (VITAMIN D) 1000 units tablet Take 1,000 Units by mouth daily.     escitalopram (LEXAPRO) 10 MG tablet Take 10 mg by mouth daily.     famotidine (PEPCID) 40 MG tablet Take 40 mg by mouth daily.     gabapentin (NEURONTIN) 300 MG capsule Take 300 mg by mouth at bedtime.      lisinopril (ZESTRIL) 5 MG tablet Take 5 mg by mouth daily.     Magnesium 250 MG TABS Take 250 mg by mouth daily.     Multiple Vitamin (MULTIVITAMIN WITH MINERALS) TABS tablet Take 1 tablet by mouth daily. Centrum Silver     Probiotic Product (ALIGN) 4 MG CAPS Take 4 mg by mouth daily.     Propylene Glycol (SYSTANE BALANCE) 0.6 % SOLN Apply 1 drop to eye daily as needed (dry eyes).     No current facility-administered medications for this visit.    Allergies:   Chocolate and Penicillins   Social History:  The patient  reports that she has never smoked. She has never used smokeless tobacco. She reports current alcohol use of about 14.0 standard drinks of alcohol per week. She reports that she does not use drugs.   Family History:  The patient's family history includes Dementia in her mother; Heart disease in her brother and father.   ROS:  Please see the history of present illness.   Otherwise, review of systems is positive for none.   All other systems are reviewed and negative.   PHYSICAL EXAM: VS:  BP 130/72   Pulse 60   Ht 5' 2.5" (1.588 m)   Wt 105 lb 6.4 oz (47.8 kg)   SpO2 98%   BMI 18.97 kg/m  , BMI Body mass index is 18.97 kg/m. GEN: Well nourished, well developed, in no acute distress  HEENT: normal  Neck: no JVD, carotid bruits, or masses Cardiac: RRR; no murmurs, rubs, or gallops,no edema  Respiratory:  clear to auscultation bilaterally, normal work of breathing GI: soft, nontender, nondistended, + BS MS: no deformity or atrophy  Skin: warm and dry, device site well healed Neuro:  Strength and sensation are intact Psych:  euthymic mood, full affect  EKG:  EKG is ordered today. Personal review of the ekg ordered shows AV paced  Personal review of the device interrogation today. Results in [facility] No results found for requested labs within last 365 days.    Lipid Panel     Component Value Date/Time   CHOL 179 03/26/2018 0550   TRIG 49 03/26/2018 0550   HDL 66 03/26/2018 0550   CHOLHDL 2.7 03/26/2018 0550   VLDL 10 03/26/2018 0550   LDLCALC 103 (H) 03/26/2018 0550     Wt Readings from Last 3 Encounters:  08/14/22 105 lb 6.4 oz (47.8 kg)  07/20/21 111 lb 15.9 oz (50.8 kg)  07/06/21 112 lb (50.8 kg)  Other studies Reviewed: Additional studies/ records that were reviewed today include: LHC 07/03/18 Review of the above records today demonstrates:  - Left ventricle: The cavity size was normal. Systolic function was    normal. The estimated ejection fraction was in the range of 55%    to 60%. Wall motion was normal; there were no regional wall    motion abnormalities. Doppler parameters are consistent with    abnormal left ventricular relaxation (grade 1 diastolic    dysfunction).  - Aortic valve: There was no regurgitation.  - Mitral valve: Transvalvular velocity was within the normal range.    There was no evidence for stenosis. There was no regurgitation.  - Left atrium: The atrium was mildly dilated.  - Right ventricle: The cavity size was normal. Wall thickness was    normal. Systolic function was normal.  - Tricuspid valve: There was no regurgitation.  - Pulmonary arteries: Systolic pressure could not be accurately    estimated.    ASSESSMENT AND PLAN:  1.  Syncope with complete heart block: Status post Medtronic dual-chamber pacemaker implanted 11/29/2017.  Device functioning appropriately.  Impedance, threshold, sensing within normal limits.  No changes.  2.  Takotsubo cardiomyopathy: Ejection fraction has normalized.  No changes.  3.  Right bundle branch block,  left anterior fascicular block: Has remained stable.   Current medicines are reviewed at length with the patient today.   The patient does not have concerns regarding her medicines.  The following changes were made today: none  Labs/ tests ordered today include:  Orders Placed This Encounter  Procedures   EKG 12-Lead     Disposition:   FU 12 months  Signed, Isauro Skelley Meredith Leeds, MD  08/14/2022 2:54 PM     [address] [place] [address] [phone] (office) [phone] (fa

## 2022-08-15 DIAGNOSIS — K219 Gastro-esophageal reflux disease without esophagitis: Secondary | ICD-10-CM | POA: Diagnosis not present

## 2022-08-15 DIAGNOSIS — M8580 Other specified disorders of bone density and structure, unspecified site: Secondary | ICD-10-CM | POA: Diagnosis not present

## 2022-08-15 DIAGNOSIS — Z7952 Long term (current) use of systemic steroids: Secondary | ICD-10-CM | POA: Diagnosis not present

## 2022-08-16 DIAGNOSIS — F320 Major depressive disorder, single episode, mild: Secondary | ICD-10-CM | POA: Diagnosis not present

## 2022-09-11 ENCOUNTER — Ambulatory Visit (INDEPENDENT_AMBULATORY_CARE_PROVIDER_SITE_OTHER): Payer: Medicare Other

## 2022-09-11 DIAGNOSIS — Z23 Encounter for immunization: Secondary | ICD-10-CM | POA: Diagnosis not present

## 2022-09-11 DIAGNOSIS — I442 Atrioventricular block, complete: Secondary | ICD-10-CM

## 2022-09-11 LAB — CUP PACEART REMOTE DEVICE CHECK
Battery Remaining Longevity: 100 mo
Battery Voltage: 2.98 V
Brady Statistic AP VP Percent: 45.61 %
Brady Statistic AP VS Percent: 0.01 %
Brady Statistic AS VP Percent: 54.37 %
Brady Statistic AS VS Percent: 0.01 %
Brady Statistic RA Percent Paced: 45.58 %
Brady Statistic RV Percent Paced: 99.98 %
Date Time Interrogation Session: 20240325220553
Implantable Lead Connection Status: 753985
Implantable Lead Connection Status: 753985
Implantable Lead Implant Date: 20190614
Implantable Lead Implant Date: 20190614
Implantable Lead Location: 753859
Implantable Lead Location: 753860
Implantable Lead Model: 5076
Implantable Lead Model: 5076
Implantable Pulse Generator Implant Date: 20190614
Lead Channel Impedance Value: 285 Ohm
Lead Channel Impedance Value: 304 Ohm
Lead Channel Impedance Value: 361 Ohm
Lead Channel Impedance Value: 456 Ohm
Lead Channel Pacing Threshold Amplitude: 0.625 V
Lead Channel Pacing Threshold Amplitude: 1.375 V
Lead Channel Pacing Threshold Pulse Width: 0.4 ms
Lead Channel Pacing Threshold Pulse Width: 0.4 ms
Lead Channel Sensing Intrinsic Amplitude: 15.875 mV
Lead Channel Sensing Intrinsic Amplitude: 15.875 mV
Lead Channel Sensing Intrinsic Amplitude: 2.5 mV
Lead Channel Sensing Intrinsic Amplitude: 2.5 mV
Lead Channel Setting Pacing Amplitude: 2.5 V
Lead Channel Setting Pacing Amplitude: 2.75 V
Lead Channel Setting Pacing Pulse Width: 0.4 ms
Lead Channel Setting Sensing Sensitivity: 2.8 mV
Zone Setting Status: 755011
Zone Setting Status: 755011

## 2022-09-12 DIAGNOSIS — M545 Low back pain, unspecified: Secondary | ICD-10-CM | POA: Diagnosis not present

## 2022-09-17 DIAGNOSIS — F4321 Adjustment disorder with depressed mood: Secondary | ICD-10-CM | POA: Diagnosis not present

## 2022-09-17 DIAGNOSIS — K580 Irritable bowel syndrome with diarrhea: Secondary | ICD-10-CM | POA: Diagnosis not present

## 2022-09-17 DIAGNOSIS — I10 Essential (primary) hypertension: Secondary | ICD-10-CM | POA: Diagnosis not present

## 2022-09-17 DIAGNOSIS — F419 Anxiety disorder, unspecified: Secondary | ICD-10-CM | POA: Diagnosis not present

## 2022-09-24 DIAGNOSIS — M47812 Spondylosis without myelopathy or radiculopathy, cervical region: Secondary | ICD-10-CM | POA: Diagnosis not present

## 2022-09-24 DIAGNOSIS — M5136 Other intervertebral disc degeneration, lumbar region: Secondary | ICD-10-CM | POA: Diagnosis not present

## 2022-09-24 DIAGNOSIS — G894 Chronic pain syndrome: Secondary | ICD-10-CM | POA: Diagnosis not present

## 2022-09-24 DIAGNOSIS — M5416 Radiculopathy, lumbar region: Secondary | ICD-10-CM | POA: Diagnosis not present

## 2022-10-18 DIAGNOSIS — M5416 Radiculopathy, lumbar region: Secondary | ICD-10-CM | POA: Diagnosis not present

## 2022-10-23 NOTE — Progress Notes (Signed)
Remote pacemaker transmission.   

## 2022-11-13 DIAGNOSIS — M47812 Spondylosis without myelopathy or radiculopathy, cervical region: Secondary | ICD-10-CM | POA: Diagnosis not present

## 2022-12-11 ENCOUNTER — Ambulatory Visit (INDEPENDENT_AMBULATORY_CARE_PROVIDER_SITE_OTHER): Payer: Medicare Other

## 2022-12-11 DIAGNOSIS — I442 Atrioventricular block, complete: Secondary | ICD-10-CM | POA: Diagnosis not present

## 2022-12-12 LAB — CUP PACEART REMOTE DEVICE CHECK
Battery Remaining Longevity: 96 mo
Battery Voltage: 2.98 V
Brady Statistic AP VP Percent: 48.26 %
Brady Statistic AP VS Percent: 0.02 %
Brady Statistic AS VP Percent: 51.67 %
Brady Statistic AS VS Percent: 0.05 %
Brady Statistic RA Percent Paced: 48.25 %
Brady Statistic RV Percent Paced: 99.94 %
Date Time Interrogation Session: 20240625092127
Implantable Lead Connection Status: 753985
Implantable Lead Connection Status: 753985
Implantable Lead Implant Date: 20190614
Implantable Lead Implant Date: 20190614
Implantable Lead Location: 753859
Implantable Lead Location: 753860
Implantable Lead Model: 5076
Implantable Lead Model: 5076
Implantable Pulse Generator Implant Date: 20190614
Lead Channel Impedance Value: 285 Ohm
Lead Channel Impedance Value: 304 Ohm
Lead Channel Impedance Value: 361 Ohm
Lead Channel Impedance Value: 418 Ohm
Lead Channel Pacing Threshold Amplitude: 0.625 V
Lead Channel Pacing Threshold Amplitude: 1.125 V
Lead Channel Pacing Threshold Pulse Width: 0.4 ms
Lead Channel Pacing Threshold Pulse Width: 0.4 ms
Lead Channel Sensing Intrinsic Amplitude: 17.125 mV
Lead Channel Sensing Intrinsic Amplitude: 17.125 mV
Lead Channel Sensing Intrinsic Amplitude: 3.625 mV
Lead Channel Sensing Intrinsic Amplitude: 3.625 mV
Lead Channel Setting Pacing Amplitude: 2.25 V
Lead Channel Setting Pacing Amplitude: 2.5 V
Lead Channel Setting Pacing Pulse Width: 0.4 ms
Lead Channel Setting Sensing Sensitivity: 2.8 mV
Zone Setting Status: 755011
Zone Setting Status: 755011

## 2022-12-22 DIAGNOSIS — G894 Chronic pain syndrome: Secondary | ICD-10-CM | POA: Diagnosis not present

## 2023-01-03 NOTE — Progress Notes (Signed)
Remote pacemaker transmission.   

## 2023-02-04 DIAGNOSIS — H52203 Unspecified astigmatism, bilateral: Secondary | ICD-10-CM | POA: Diagnosis not present

## 2023-02-14 DIAGNOSIS — M8580 Other specified disorders of bone density and structure, unspecified site: Secondary | ICD-10-CM | POA: Diagnosis not present

## 2023-03-12 ENCOUNTER — Ambulatory Visit (INDEPENDENT_AMBULATORY_CARE_PROVIDER_SITE_OTHER): Payer: Medicare Other

## 2023-03-12 DIAGNOSIS — I495 Sick sinus syndrome: Secondary | ICD-10-CM | POA: Diagnosis not present

## 2023-03-12 DIAGNOSIS — I442 Atrioventricular block, complete: Secondary | ICD-10-CM

## 2023-03-12 LAB — CUP PACEART REMOTE DEVICE CHECK
Battery Remaining Longevity: 93 mo
Battery Voltage: 2.98 V
Brady Statistic AP VP Percent: 50.58 %
Brady Statistic AP VS Percent: 0.05 %
Brady Statistic AS VP Percent: 49.36 %
Brady Statistic AS VS Percent: 0.01 %
Brady Statistic RA Percent Paced: 50.58 %
Brady Statistic RV Percent Paced: 99.94 %
Date Time Interrogation Session: 20240924012022
Implantable Lead Connection Status: 753985
Implantable Lead Connection Status: 753985
Implantable Lead Implant Date: 20190614
Implantable Lead Implant Date: 20190614
Implantable Lead Location: 753859
Implantable Lead Location: 753860
Implantable Lead Model: 5076
Implantable Lead Model: 5076
Implantable Pulse Generator Implant Date: 20190614
Lead Channel Impedance Value: 285 Ohm
Lead Channel Impedance Value: 304 Ohm
Lead Channel Impedance Value: 380 Ohm
Lead Channel Impedance Value: 437 Ohm
Lead Channel Pacing Threshold Amplitude: 0.75 V
Lead Channel Pacing Threshold Amplitude: 0.875 V
Lead Channel Pacing Threshold Pulse Width: 0.4 ms
Lead Channel Pacing Threshold Pulse Width: 0.4 ms
Lead Channel Sensing Intrinsic Amplitude: 16.625 mV
Lead Channel Sensing Intrinsic Amplitude: 16.625 mV
Lead Channel Sensing Intrinsic Amplitude: 3.25 mV
Lead Channel Sensing Intrinsic Amplitude: 3.25 mV
Lead Channel Setting Pacing Amplitude: 2 V
Lead Channel Setting Pacing Amplitude: 2.5 V
Lead Channel Setting Pacing Pulse Width: 0.4 ms
Lead Channel Setting Sensing Sensitivity: 2.8 mV
Zone Setting Status: 755011
Zone Setting Status: 755011

## 2023-03-18 DIAGNOSIS — M810 Age-related osteoporosis without current pathological fracture: Secondary | ICD-10-CM | POA: Diagnosis not present

## 2023-03-28 NOTE — Progress Notes (Signed)
Remote pacemaker transmission.   

## 2023-04-02 DIAGNOSIS — Z0000 Encounter for general adult medical examination without abnormal findings: Secondary | ICD-10-CM | POA: Diagnosis not present

## 2023-04-02 DIAGNOSIS — M810 Age-related osteoporosis without current pathological fracture: Secondary | ICD-10-CM | POA: Diagnosis not present

## 2023-04-02 DIAGNOSIS — Z23 Encounter for immunization: Secondary | ICD-10-CM | POA: Diagnosis not present

## 2023-04-02 DIAGNOSIS — R233 Spontaneous ecchymoses: Secondary | ICD-10-CM | POA: Diagnosis not present

## 2023-04-02 DIAGNOSIS — F4321 Adjustment disorder with depressed mood: Secondary | ICD-10-CM | POA: Diagnosis not present

## 2023-04-02 DIAGNOSIS — I10 Essential (primary) hypertension: Secondary | ICD-10-CM | POA: Diagnosis not present

## 2023-04-02 DIAGNOSIS — E785 Hyperlipidemia, unspecified: Secondary | ICD-10-CM | POA: Diagnosis not present

## 2023-04-02 DIAGNOSIS — F419 Anxiety disorder, unspecified: Secondary | ICD-10-CM | POA: Diagnosis not present

## 2023-04-02 DIAGNOSIS — Z950 Presence of cardiac pacemaker: Secondary | ICD-10-CM | POA: Diagnosis not present

## 2023-05-08 DIAGNOSIS — Z79899 Other long term (current) drug therapy: Secondary | ICD-10-CM | POA: Diagnosis not present

## 2023-05-08 DIAGNOSIS — M5416 Radiculopathy, lumbar region: Secondary | ICD-10-CM | POA: Diagnosis not present

## 2023-05-08 DIAGNOSIS — M47812 Spondylosis without myelopathy or radiculopathy, cervical region: Secondary | ICD-10-CM | POA: Diagnosis not present

## 2023-05-08 DIAGNOSIS — G894 Chronic pain syndrome: Secondary | ICD-10-CM | POA: Diagnosis not present

## 2023-05-08 DIAGNOSIS — M51361 Other intervertebral disc degeneration, lumbar region with lower extremity pain only: Secondary | ICD-10-CM | POA: Diagnosis not present

## 2023-05-08 DIAGNOSIS — Z5181 Encounter for therapeutic drug level monitoring: Secondary | ICD-10-CM | POA: Diagnosis not present

## 2023-05-23 DIAGNOSIS — M5416 Radiculopathy, lumbar region: Secondary | ICD-10-CM | POA: Diagnosis not present

## 2023-06-11 ENCOUNTER — Ambulatory Visit (INDEPENDENT_AMBULATORY_CARE_PROVIDER_SITE_OTHER): Payer: Medicare Other

## 2023-06-11 DIAGNOSIS — I442 Atrioventricular block, complete: Secondary | ICD-10-CM

## 2023-06-11 DIAGNOSIS — I495 Sick sinus syndrome: Secondary | ICD-10-CM | POA: Diagnosis not present

## 2023-06-12 LAB — CUP PACEART REMOTE DEVICE CHECK
Battery Remaining Longevity: 86 mo
Battery Voltage: 2.98 V
Brady Statistic AP VP Percent: 45.91 %
Brady Statistic AP VS Percent: 0 %
Brady Statistic AS VP Percent: 54.08 %
Brady Statistic AS VS Percent: 0.01 %
Brady Statistic RA Percent Paced: 45.87 %
Brady Statistic RV Percent Paced: 99.99 %
Date Time Interrogation Session: 20241224000051
Implantable Lead Connection Status: 753985
Implantable Lead Connection Status: 753985
Implantable Lead Implant Date: 20190614
Implantable Lead Implant Date: 20190614
Implantable Lead Location: 753859
Implantable Lead Location: 753860
Implantable Lead Model: 5076
Implantable Lead Model: 5076
Implantable Pulse Generator Implant Date: 20190614
Lead Channel Impedance Value: 304 Ohm
Lead Channel Impedance Value: 323 Ohm
Lead Channel Impedance Value: 380 Ohm
Lead Channel Impedance Value: 437 Ohm
Lead Channel Pacing Threshold Amplitude: 0.75 V
Lead Channel Pacing Threshold Amplitude: 1.125 V
Lead Channel Pacing Threshold Pulse Width: 0.4 ms
Lead Channel Pacing Threshold Pulse Width: 0.4 ms
Lead Channel Sensing Intrinsic Amplitude: 16.625 mV
Lead Channel Sensing Intrinsic Amplitude: 16.625 mV
Lead Channel Sensing Intrinsic Amplitude: 5.125 mV
Lead Channel Sensing Intrinsic Amplitude: 5.125 mV
Lead Channel Setting Pacing Amplitude: 2.25 V
Lead Channel Setting Pacing Amplitude: 2.5 V
Lead Channel Setting Pacing Pulse Width: 0.4 ms
Lead Channel Setting Sensing Sensitivity: 2.8 mV
Zone Setting Status: 755011
Zone Setting Status: 755011

## 2023-07-24 NOTE — Progress Notes (Signed)
 Remote pacemaker transmission.

## 2023-07-26 DIAGNOSIS — Z1231 Encounter for screening mammogram for malignant neoplasm of breast: Secondary | ICD-10-CM | POA: Diagnosis not present

## 2023-07-26 DIAGNOSIS — Z01419 Encounter for gynecological examination (general) (routine) without abnormal findings: Secondary | ICD-10-CM | POA: Diagnosis not present

## 2023-07-26 DIAGNOSIS — Z681 Body mass index (BMI) 19 or less, adult: Secondary | ICD-10-CM | POA: Diagnosis not present

## 2023-08-09 ENCOUNTER — Ambulatory Visit: Payer: Medicare Other | Admitting: Cardiology

## 2023-09-04 DIAGNOSIS — K080 Exfoliation of teeth due to systemic causes: Secondary | ICD-10-CM | POA: Diagnosis not present

## 2023-09-10 ENCOUNTER — Ambulatory Visit (INDEPENDENT_AMBULATORY_CARE_PROVIDER_SITE_OTHER): Payer: Medicare Other

## 2023-09-10 DIAGNOSIS — I495 Sick sinus syndrome: Secondary | ICD-10-CM

## 2023-09-10 DIAGNOSIS — M810 Age-related osteoporosis without current pathological fracture: Secondary | ICD-10-CM | POA: Diagnosis not present

## 2023-09-10 LAB — CUP PACEART REMOTE DEVICE CHECK
Battery Remaining Longevity: 76 mo
Battery Voltage: 2.97 V
Brady Statistic AP VP Percent: 49.79 %
Brady Statistic AP VS Percent: 0 %
Brady Statistic AS VP Percent: 50.2 %
Brady Statistic AS VS Percent: 0.01 %
Brady Statistic RA Percent Paced: 49.75 %
Brady Statistic RV Percent Paced: 99.99 %
Date Time Interrogation Session: 20250325011900
Implantable Lead Connection Status: 753985
Implantable Lead Connection Status: 753985
Implantable Lead Implant Date: 20190614
Implantable Lead Implant Date: 20190614
Implantable Lead Location: 753859
Implantable Lead Location: 753860
Implantable Lead Model: 5076
Implantable Lead Model: 5076
Implantable Pulse Generator Implant Date: 20190614
Lead Channel Impedance Value: 285 Ohm
Lead Channel Impedance Value: 304 Ohm
Lead Channel Impedance Value: 361 Ohm
Lead Channel Impedance Value: 456 Ohm
Lead Channel Pacing Threshold Amplitude: 0.625 V
Lead Channel Pacing Threshold Amplitude: 1.375 V
Lead Channel Pacing Threshold Pulse Width: 0.4 ms
Lead Channel Pacing Threshold Pulse Width: 0.4 ms
Lead Channel Sensing Intrinsic Amplitude: 16.625 mV
Lead Channel Sensing Intrinsic Amplitude: 16.625 mV
Lead Channel Sensing Intrinsic Amplitude: 4 mV
Lead Channel Sensing Intrinsic Amplitude: 4 mV
Lead Channel Setting Pacing Amplitude: 2.5 V
Lead Channel Setting Pacing Amplitude: 2.75 V
Lead Channel Setting Pacing Pulse Width: 0.4 ms
Lead Channel Setting Sensing Sensitivity: 2.8 mV
Zone Setting Status: 755011
Zone Setting Status: 755011

## 2023-09-11 DIAGNOSIS — M47812 Spondylosis without myelopathy or radiculopathy, cervical region: Secondary | ICD-10-CM | POA: Diagnosis not present

## 2023-09-11 DIAGNOSIS — M5416 Radiculopathy, lumbar region: Secondary | ICD-10-CM | POA: Diagnosis not present

## 2023-09-11 DIAGNOSIS — M51361 Other intervertebral disc degeneration, lumbar region with lower extremity pain only: Secondary | ICD-10-CM | POA: Diagnosis not present

## 2023-09-11 DIAGNOSIS — G894 Chronic pain syndrome: Secondary | ICD-10-CM | POA: Diagnosis not present

## 2023-09-19 DIAGNOSIS — I10 Essential (primary) hypertension: Secondary | ICD-10-CM | POA: Diagnosis not present

## 2023-09-19 DIAGNOSIS — K219 Gastro-esophageal reflux disease without esophagitis: Secondary | ICD-10-CM | POA: Diagnosis not present

## 2023-09-19 DIAGNOSIS — Z7952 Long term (current) use of systemic steroids: Secondary | ICD-10-CM | POA: Diagnosis not present

## 2023-09-19 DIAGNOSIS — M810 Age-related osteoporosis without current pathological fracture: Secondary | ICD-10-CM | POA: Diagnosis not present

## 2023-09-24 NOTE — Progress Notes (Unsigned)
  Electrophysiology Office Note:   Date:  09/25/2023  ID:  Karen Gomez, DOB 1945-06-29, MRN [id number]  Primary Cardiologist: None Primary Heart Failure: None Electrophysiologist: Lino Wickliff Jorja Loa, MD      History of Present Illness:   Karen Gomez is a 78 y.o. female with h/o hypertension, intermittent complete heart block seen today for routine electrophysiology followup.   Since last being seen in our clinic the patient reports doing well.  She has intermittent chest discomfort that sometimes wakes her from sleep.  It is not associated with exertion.  She had a left heart catheterization in 2019 that showed no coronary artery disease.  She states that pressing on her chest improves the symptoms.  She has not had this discomfort in approximately a year.  She continues to do all of her daily activities.  She walks on a daily basis and does not have any discomfort or shortness of breath associated with exertion.  she denies chest pain, palpitations, dyspnea, PND, orthopnea, nausea, vomiting, dizziness, syncope, edema, weight gain, or early satiety.   Review of systems complete and found to be negative unless listed in HPI.      EP Information / Studies Reviewed:    EKG is ordered today. Personal review as below.      PPM Interrogation-  reviewed in detail today,  See PACEART report.  Device History: Medtronic Dual Chamber PPM implanted 11/29/2017 for CHB  Risk Assessment/Calculations:             Physical Exam:   VS:  BP 130/68 (BP Location: Left Arm, Patient Position: Sitting, Cuff Size: Normal)   Pulse 60   Ht 5' 2.5" (1.588 m)   Wt 105 lb (47.6 kg)   SpO2 97%   BMI 18.90 kg/m    Wt Readings from Last 3 Encounters:  09/25/23 105 lb (47.6 kg)  08/14/22 105 lb 6.4 oz (47.8 kg)  07/20/21 111 lb 15.9 oz (50.8 kg)     GEN: Well nourished, well developed in no acute distress NECK: No JVD; No carotid bruits CARDIAC: Regular rate and rhythm, no murmurs, rubs,  gallops RESPIRATORY:  Clear to auscultation without rales, wheezing or rhonchi  ABDOMEN: Soft, non-tender, non-distended EXTREMITIES:  No edema; No deformity   ASSESSMENT AND PLAN:    CHB s/p Medtronic PPM  Normal PPM function See Pace Art report Sensing, threshold, impedance within normal limits Device programming reviewed and switched from MVP to DDD.   2.  Takotsubo cardiomyopathy: Ejection fraction normalized.  No changes.  3.  Hypertension:well controlled  4.  Chest discomfort: Quite atypical for coronary artery disease.  Additionally, she had left heart catheterization in 2019 with normal coronary arteries.  Shana Younge continue monitoring.  Disposition:   Follow up with EP APP in 12 months  Signed, Tiago Humphrey Jorja Loa, MD

## 2023-09-25 ENCOUNTER — Ambulatory Visit: Payer: Medicare Other | Attending: Cardiology | Admitting: Cardiology

## 2023-09-25 ENCOUNTER — Encounter: Payer: Self-pay | Admitting: Cardiology

## 2023-09-25 VITALS — BP 130/68 | HR 60 | Ht 62.5 in | Wt 105.0 lb

## 2023-09-25 DIAGNOSIS — I495 Sick sinus syndrome: Secondary | ICD-10-CM

## 2023-09-25 DIAGNOSIS — I442 Atrioventricular block, complete: Secondary | ICD-10-CM | POA: Diagnosis not present

## 2023-09-25 DIAGNOSIS — I5181 Takotsubo syndrome: Secondary | ICD-10-CM

## 2023-09-25 LAB — CUP PACEART INCLINIC DEVICE CHECK
Date Time Interrogation Session: 20250409095945
Implantable Lead Connection Status: 753985
Implantable Lead Connection Status: 753985
Implantable Lead Implant Date: 20190614
Implantable Lead Implant Date: 20190614
Implantable Lead Location: 753859
Implantable Lead Location: 753860
Implantable Lead Model: 5076
Implantable Lead Model: 5076
Implantable Pulse Generator Implant Date: 20190614

## 2023-09-25 NOTE — Patient Instructions (Signed)
 Medication Instructions:  Your physician recommends that you continue on your current medications as directed. Please refer to the Current Medication list given to you today.    *If you need a refill on your cardiac medications before your next appointment, please call your pharmacy*  Lab Work: None ordered.  If you have labs (blood work) drawn today and your tests are completely normal, you will receive your results only by: MyChart Message (if you have MyChart) OR A paper copy in the mail If you have any lab test that is abnormal or we need to change your treatment, we will call you to review the results.  Testing/Procedures: None ordered.   Follow-Up: At [facility], you and your health needs are our priority.  As part of our continuing mission to provide you with exceptional heart care, our providers are all part of one team.  This team includes your primary Cardiologist (physician) and Advanced Practice Providers or APPs (Physician Assistants and Nurse Practitioners) who all work together to provide you with the care you need, when you need it.  Your next appointment:   12 months with Dr Elberta Fortis      1st Floor: - Lobby - Registration  - Pharmacy  - Lab - Cafe  2nd Floor: - PV Lab - Diagnostic Testing (echo, CT, nuclear med)  3rd Floor: - Vacant  4th Floor: - TCTS (cardiothoracic surgery) - AFib Clinic - Structural Heart Clinic - Vascular Surgery  - Vascular Ultrasound  5th Floor: - HeartCare Cardiology (general and EP) - Clinical Pharmacy for coumadin, hypertension, lipid, weight-loss medications, and med management appointments    Valet parking services will be available as well.

## 2023-09-27 DIAGNOSIS — S62354A Nondisplaced fracture of shaft of fourth metacarpal bone, right hand, initial encounter for closed fracture: Secondary | ICD-10-CM | POA: Diagnosis not present

## 2023-10-02 DIAGNOSIS — S62354D Nondisplaced fracture of shaft of fourth metacarpal bone, right hand, subsequent encounter for fracture with routine healing: Secondary | ICD-10-CM | POA: Diagnosis not present

## 2023-10-02 DIAGNOSIS — M79641 Pain in right hand: Secondary | ICD-10-CM | POA: Diagnosis not present

## 2023-10-08 DIAGNOSIS — F4321 Adjustment disorder with depressed mood: Secondary | ICD-10-CM | POA: Diagnosis not present

## 2023-10-08 DIAGNOSIS — Z950 Presence of cardiac pacemaker: Secondary | ICD-10-CM | POA: Diagnosis not present

## 2023-10-08 DIAGNOSIS — I10 Essential (primary) hypertension: Secondary | ICD-10-CM | POA: Diagnosis not present

## 2023-10-08 DIAGNOSIS — F419 Anxiety disorder, unspecified: Secondary | ICD-10-CM | POA: Diagnosis not present

## 2023-10-23 DIAGNOSIS — S62354D Nondisplaced fracture of shaft of fourth metacarpal bone, right hand, subsequent encounter for fracture with routine healing: Secondary | ICD-10-CM | POA: Diagnosis not present

## 2023-10-25 NOTE — Progress Notes (Signed)
 Remote pacemaker transmission.

## 2023-10-28 DIAGNOSIS — M25641 Stiffness of right hand, not elsewhere classified: Secondary | ICD-10-CM | POA: Diagnosis not present

## 2023-12-10 ENCOUNTER — Ambulatory Visit (INDEPENDENT_AMBULATORY_CARE_PROVIDER_SITE_OTHER): Payer: Self-pay

## 2023-12-10 DIAGNOSIS — I495 Sick sinus syndrome: Secondary | ICD-10-CM

## 2023-12-10 LAB — CUP PACEART REMOTE DEVICE CHECK
Battery Remaining Longevity: 68 mo
Battery Voltage: 2.97 V
Brady Statistic AP VP Percent: 44.92 %
Brady Statistic AP VS Percent: 0 %
Brady Statistic AS VP Percent: 55.08 %
Brady Statistic AS VS Percent: 0.01 %
Brady Statistic RA Percent Paced: 44.88 %
Brady Statistic RV Percent Paced: 99.99 %
Date Time Interrogation Session: 20250624102057
Implantable Lead Connection Status: 753985
Implantable Lead Connection Status: 753985
Implantable Lead Implant Date: 20190614
Implantable Lead Implant Date: 20190614
Implantable Lead Location: 753859
Implantable Lead Location: 753860
Implantable Lead Model: 5076
Implantable Lead Model: 5076
Implantable Pulse Generator Implant Date: 20190614
Lead Channel Impedance Value: 304 Ohm
Lead Channel Impedance Value: 304 Ohm
Lead Channel Impedance Value: 361 Ohm
Lead Channel Impedance Value: 475 Ohm
Lead Channel Pacing Threshold Amplitude: 0.625 V
Lead Channel Pacing Threshold Amplitude: 1.375 V
Lead Channel Pacing Threshold Pulse Width: 0.4 ms
Lead Channel Pacing Threshold Pulse Width: 0.4 ms
Lead Channel Sensing Intrinsic Amplitude: 10.375 mV
Lead Channel Sensing Intrinsic Amplitude: 10.375 mV
Lead Channel Sensing Intrinsic Amplitude: 3.25 mV
Lead Channel Sensing Intrinsic Amplitude: 3.25 mV
Lead Channel Setting Pacing Amplitude: 2.5 V
Lead Channel Setting Pacing Amplitude: 2.75 V
Lead Channel Setting Pacing Pulse Width: 0.4 ms
Lead Channel Setting Sensing Sensitivity: 2.8 mV
Zone Setting Status: 755011
Zone Setting Status: 755011

## 2023-12-12 ENCOUNTER — Ambulatory Visit: Payer: Self-pay | Admitting: Cardiology

## 2024-01-01 DIAGNOSIS — G894 Chronic pain syndrome: Secondary | ICD-10-CM | POA: Diagnosis not present

## 2024-01-01 DIAGNOSIS — M47812 Spondylosis without myelopathy or radiculopathy, cervical region: Secondary | ICD-10-CM | POA: Diagnosis not present

## 2024-02-05 DIAGNOSIS — H524 Presbyopia: Secondary | ICD-10-CM | POA: Diagnosis not present

## 2024-03-10 ENCOUNTER — Ambulatory Visit (INDEPENDENT_AMBULATORY_CARE_PROVIDER_SITE_OTHER): Payer: Self-pay

## 2024-03-10 DIAGNOSIS — I495 Sick sinus syndrome: Secondary | ICD-10-CM

## 2024-03-10 LAB — CUP PACEART REMOTE DEVICE CHECK
Battery Remaining Longevity: 63 mo
Battery Voltage: 2.96 V
Brady Statistic AP VP Percent: 50.49 %
Brady Statistic AP VS Percent: 0 %
Brady Statistic AS VP Percent: 49.51 %
Brady Statistic AS VS Percent: 0.01 %
Brady Statistic RA Percent Paced: 50.45 %
Brady Statistic RV Percent Paced: 99.99 %
Date Time Interrogation Session: 20250923010824
Implantable Lead Connection Status: 753985
Implantable Lead Connection Status: 753985
Implantable Lead Implant Date: 20190614
Implantable Lead Implant Date: 20190614
Implantable Lead Location: 753859
Implantable Lead Location: 753860
Implantable Lead Model: 5076
Implantable Lead Model: 5076
Implantable Pulse Generator Implant Date: 20190614
Lead Channel Impedance Value: 304 Ohm
Lead Channel Impedance Value: 304 Ohm
Lead Channel Impedance Value: 380 Ohm
Lead Channel Impedance Value: 456 Ohm
Lead Channel Pacing Threshold Amplitude: 0.625 V
Lead Channel Pacing Threshold Amplitude: 1.25 V
Lead Channel Pacing Threshold Pulse Width: 0.4 ms
Lead Channel Pacing Threshold Pulse Width: 0.4 ms
Lead Channel Sensing Intrinsic Amplitude: 10.375 mV
Lead Channel Sensing Intrinsic Amplitude: 10.375 mV
Lead Channel Sensing Intrinsic Amplitude: 5.125 mV
Lead Channel Sensing Intrinsic Amplitude: 5.125 mV
Lead Channel Setting Pacing Amplitude: 2.5 V
Lead Channel Setting Pacing Amplitude: 2.5 V
Lead Channel Setting Pacing Pulse Width: 0.4 ms
Lead Channel Setting Sensing Sensitivity: 2.8 mV
Zone Setting Status: 755011
Zone Setting Status: 755011

## 2024-03-11 ENCOUNTER — Ambulatory Visit: Payer: Self-pay | Admitting: Cardiology

## 2024-03-11 NOTE — Progress Notes (Signed)
 Remote PPM Transmission

## 2024-03-17 DIAGNOSIS — M810 Age-related osteoporosis without current pathological fracture: Secondary | ICD-10-CM | POA: Diagnosis not present

## 2024-03-30 DIAGNOSIS — M810 Age-related osteoporosis without current pathological fracture: Secondary | ICD-10-CM | POA: Diagnosis not present

## 2024-04-14 DIAGNOSIS — I10 Essential (primary) hypertension: Secondary | ICD-10-CM | POA: Diagnosis not present

## 2024-04-14 DIAGNOSIS — K580 Irritable bowel syndrome with diarrhea: Secondary | ICD-10-CM | POA: Diagnosis not present

## 2024-04-14 DIAGNOSIS — Z0000 Encounter for general adult medical examination without abnormal findings: Secondary | ICD-10-CM | POA: Diagnosis not present

## 2024-04-14 DIAGNOSIS — F419 Anxiety disorder, unspecified: Secondary | ICD-10-CM | POA: Diagnosis not present

## 2024-04-14 DIAGNOSIS — Z23 Encounter for immunization: Secondary | ICD-10-CM | POA: Diagnosis not present

## 2024-04-14 DIAGNOSIS — M810 Age-related osteoporosis without current pathological fracture: Secondary | ICD-10-CM | POA: Diagnosis not present

## 2024-05-04 DIAGNOSIS — D1801 Hemangioma of skin and subcutaneous tissue: Secondary | ICD-10-CM | POA: Diagnosis not present

## 2024-05-04 DIAGNOSIS — D1809 Hemangioma of other sites: Secondary | ICD-10-CM | POA: Diagnosis not present

## 2024-05-04 DIAGNOSIS — L308 Other specified dermatitis: Secondary | ICD-10-CM | POA: Diagnosis not present

## 2024-05-04 DIAGNOSIS — L2989 Other pruritus: Secondary | ICD-10-CM | POA: Diagnosis not present

## 2024-05-18 DIAGNOSIS — Z79891 Long term (current) use of opiate analgesic: Secondary | ICD-10-CM | POA: Diagnosis not present

## 2024-05-18 DIAGNOSIS — M47812 Spondylosis without myelopathy or radiculopathy, cervical region: Secondary | ICD-10-CM | POA: Diagnosis not present

## 2024-05-18 DIAGNOSIS — M545 Low back pain, unspecified: Secondary | ICD-10-CM | POA: Diagnosis not present

## 2024-05-18 DIAGNOSIS — G894 Chronic pain syndrome: Secondary | ICD-10-CM | POA: Diagnosis not present

## 2024-06-09 ENCOUNTER — Ambulatory Visit: Payer: Self-pay

## 2024-06-09 DIAGNOSIS — I495 Sick sinus syndrome: Secondary | ICD-10-CM | POA: Diagnosis not present

## 2024-06-15 LAB — CUP PACEART REMOTE DEVICE CHECK
Battery Remaining Longevity: 56 mo
Battery Voltage: 2.96 V
Brady Statistic AP VP Percent: 40.36 %
Brady Statistic AP VS Percent: 0 %
Brady Statistic AS VP Percent: 59.59 %
Brady Statistic AS VS Percent: 0.05 %
Brady Statistic RA Percent Paced: 40.33 %
Brady Statistic RV Percent Paced: 99.95 %
Date Time Interrogation Session: 20251227183229
Implantable Lead Connection Status: 753985
Implantable Lead Connection Status: 753985
Implantable Lead Implant Date: 20190614
Implantable Lead Implant Date: 20190614
Implantable Lead Location: 753859
Implantable Lead Location: 753860
Implantable Lead Model: 5076
Implantable Lead Model: 5076
Implantable Pulse Generator Implant Date: 20190614
Lead Channel Impedance Value: 304 Ohm
Lead Channel Impedance Value: 323 Ohm
Lead Channel Impedance Value: 380 Ohm
Lead Channel Impedance Value: 437 Ohm
Lead Channel Pacing Threshold Amplitude: 0.5 V
Lead Channel Pacing Threshold Amplitude: 1.125 V
Lead Channel Pacing Threshold Pulse Width: 0.4 ms
Lead Channel Pacing Threshold Pulse Width: 0.4 ms
Lead Channel Sensing Intrinsic Amplitude: 10.375 mV
Lead Channel Sensing Intrinsic Amplitude: 10.375 mV
Lead Channel Sensing Intrinsic Amplitude: 4.25 mV
Lead Channel Sensing Intrinsic Amplitude: 4.25 mV
Lead Channel Setting Pacing Amplitude: 2.5 V
Lead Channel Setting Pacing Amplitude: 2.5 V
Lead Channel Setting Pacing Pulse Width: 0.4 ms
Lead Channel Setting Sensing Sensitivity: 2.8 mV
Zone Setting Status: 755011
Zone Setting Status: 755011

## 2024-06-16 ENCOUNTER — Ambulatory Visit: Payer: Self-pay | Admitting: Cardiology

## 2024-06-17 NOTE — Progress Notes (Signed)
 Remote PPM Transmission
